# Patient Record
Sex: Male | Born: 1935 | Race: White | Hispanic: No | Marital: Married | Smoking: Former smoker
Health system: Southern US, Community
[De-identification: ages and names within clinical notes are randomized; demographics above are authoritative.]

## PROBLEM LIST (undated history)

## (undated) DIAGNOSIS — F0390 Unspecified dementia without behavioral disturbance: Secondary | ICD-10-CM

## (undated) DIAGNOSIS — N19 Unspecified kidney failure: Secondary | ICD-10-CM

## (undated) DIAGNOSIS — K219 Gastro-esophageal reflux disease without esophagitis: Secondary | ICD-10-CM

## (undated) DIAGNOSIS — K589 Irritable bowel syndrome without diarrhea: Secondary | ICD-10-CM

## (undated) DIAGNOSIS — I7300 Raynaud's syndrome without gangrene: Secondary | ICD-10-CM

## (undated) DIAGNOSIS — E785 Hyperlipidemia, unspecified: Secondary | ICD-10-CM

## (undated) DIAGNOSIS — I10 Essential (primary) hypertension: Secondary | ICD-10-CM

## (undated) DIAGNOSIS — K5790 Diverticulosis of intestine, part unspecified, without perforation or abscess without bleeding: Secondary | ICD-10-CM

## (undated) HISTORY — PX: HERNIA REPAIR: SHX51

## (undated) HISTORY — PX: CHOLECYSTECTOMY: SHX55

## (undated) HISTORY — PX: AV FISTULA PLACEMENT: SHX1204

---

## 2000-07-01 ENCOUNTER — Other Ambulatory Visit: Admission: RE | Admit: 2000-07-01 | Discharge: 2000-07-01 | Payer: Self-pay | Admitting: Gastroenterology

## 2000-07-01 ENCOUNTER — Encounter (INDEPENDENT_AMBULATORY_CARE_PROVIDER_SITE_OTHER): Payer: Self-pay

## 2001-06-02 ENCOUNTER — Encounter: Payer: Self-pay | Admitting: Internal Medicine

## 2001-06-02 ENCOUNTER — Ambulatory Visit (HOSPITAL_COMMUNITY): Admission: RE | Admit: 2001-06-02 | Discharge: 2001-06-02 | Payer: Self-pay | Admitting: Internal Medicine

## 2004-09-17 ENCOUNTER — Ambulatory Visit: Payer: Self-pay | Admitting: Internal Medicine

## 2004-09-20 ENCOUNTER — Ambulatory Visit: Payer: Self-pay | Admitting: Internal Medicine

## 2004-09-25 ENCOUNTER — Ambulatory Visit: Payer: Self-pay | Admitting: Internal Medicine

## 2004-10-03 ENCOUNTER — Ambulatory Visit: Payer: Self-pay | Admitting: Internal Medicine

## 2004-10-16 ENCOUNTER — Ambulatory Visit: Payer: Self-pay

## 2004-10-25 ENCOUNTER — Ambulatory Visit: Payer: Self-pay | Admitting: Internal Medicine

## 2004-11-07 ENCOUNTER — Ambulatory Visit (HOSPITAL_COMMUNITY): Admission: RE | Admit: 2004-11-07 | Discharge: 2004-11-07 | Payer: Self-pay | Admitting: Nephrology

## 2004-11-22 ENCOUNTER — Ambulatory Visit: Payer: Self-pay | Admitting: Internal Medicine

## 2004-12-12 ENCOUNTER — Encounter (HOSPITAL_COMMUNITY): Admission: RE | Admit: 2004-12-12 | Discharge: 2005-03-12 | Payer: Self-pay | Admitting: Nephrology

## 2005-01-01 ENCOUNTER — Ambulatory Visit: Payer: Self-pay | Admitting: Internal Medicine

## 2005-06-25 ENCOUNTER — Ambulatory Visit: Payer: Self-pay | Admitting: Internal Medicine

## 2005-08-13 ENCOUNTER — Ambulatory Visit: Payer: Self-pay | Admitting: Family Medicine

## 2005-11-03 ENCOUNTER — Ambulatory Visit: Payer: Self-pay | Admitting: Family Medicine

## 2005-12-08 ENCOUNTER — Ambulatory Visit: Payer: Self-pay | Admitting: Family Medicine

## 2005-12-22 ENCOUNTER — Ambulatory Visit: Payer: Self-pay | Admitting: Family Medicine

## 2005-12-25 ENCOUNTER — Encounter: Payer: Self-pay | Admitting: Family Medicine

## 2006-01-22 ENCOUNTER — Ambulatory Visit: Payer: Self-pay | Admitting: Family Medicine

## 2006-03-12 ENCOUNTER — Ambulatory Visit: Payer: Self-pay | Admitting: Family Medicine

## 2006-03-24 DIAGNOSIS — F339 Major depressive disorder, recurrent, unspecified: Secondary | ICD-10-CM | POA: Insufficient documentation

## 2006-03-24 DIAGNOSIS — N400 Enlarged prostate without lower urinary tract symptoms: Secondary | ICD-10-CM | POA: Insufficient documentation

## 2006-03-24 DIAGNOSIS — M19049 Primary osteoarthritis, unspecified hand: Secondary | ICD-10-CM

## 2006-03-24 DIAGNOSIS — I10 Essential (primary) hypertension: Secondary | ICD-10-CM

## 2006-03-24 DIAGNOSIS — I7300 Raynaud's syndrome without gangrene: Secondary | ICD-10-CM | POA: Insufficient documentation

## 2006-03-24 DIAGNOSIS — D126 Benign neoplasm of colon, unspecified: Secondary | ICD-10-CM

## 2006-03-24 DIAGNOSIS — D649 Anemia, unspecified: Secondary | ICD-10-CM

## 2006-03-24 DIAGNOSIS — E785 Hyperlipidemia, unspecified: Secondary | ICD-10-CM | POA: Insufficient documentation

## 2006-03-30 ENCOUNTER — Ambulatory Visit: Payer: Self-pay | Admitting: Family Medicine

## 2006-04-10 ENCOUNTER — Encounter: Payer: Self-pay | Admitting: Family Medicine

## 2006-04-27 ENCOUNTER — Ambulatory Visit: Payer: Self-pay | Admitting: Family Medicine

## 2006-05-19 ENCOUNTER — Encounter: Payer: Self-pay | Admitting: Family Medicine

## 2006-06-19 ENCOUNTER — Ambulatory Visit: Payer: Self-pay | Admitting: Family Medicine

## 2006-06-19 DIAGNOSIS — R0989 Other specified symptoms and signs involving the circulatory and respiratory systems: Secondary | ICD-10-CM

## 2006-06-19 DIAGNOSIS — R0609 Other forms of dyspnea: Secondary | ICD-10-CM | POA: Insufficient documentation

## 2006-08-10 ENCOUNTER — Encounter: Payer: Self-pay | Admitting: Family Medicine

## 2006-08-18 ENCOUNTER — Encounter: Payer: Self-pay | Admitting: Family Medicine

## 2006-09-18 ENCOUNTER — Ambulatory Visit: Payer: Self-pay | Admitting: Family Medicine

## 2006-09-21 ENCOUNTER — Encounter: Payer: Self-pay | Admitting: Family Medicine

## 2006-09-23 ENCOUNTER — Ambulatory Visit: Payer: Self-pay | Admitting: Family Medicine

## 2006-09-23 DIAGNOSIS — M19019 Primary osteoarthritis, unspecified shoulder: Secondary | ICD-10-CM | POA: Insufficient documentation

## 2006-10-16 ENCOUNTER — Ambulatory Visit: Payer: Self-pay | Admitting: Family Medicine

## 2006-11-03 ENCOUNTER — Ambulatory Visit: Payer: Self-pay | Admitting: Vascular Surgery

## 2006-11-04 ENCOUNTER — Ambulatory Visit: Payer: Self-pay | Admitting: Vascular Surgery

## 2006-11-04 ENCOUNTER — Ambulatory Visit (HOSPITAL_COMMUNITY): Admission: RE | Admit: 2006-11-04 | Discharge: 2006-11-04 | Payer: Self-pay | Admitting: Vascular Surgery

## 2006-11-24 ENCOUNTER — Telehealth: Payer: Self-pay | Admitting: Family Medicine

## 2006-12-04 ENCOUNTER — Ambulatory Visit: Payer: Self-pay | Admitting: Family Medicine

## 2006-12-29 ENCOUNTER — Ambulatory Visit: Payer: Self-pay | Admitting: Vascular Surgery

## 2007-03-25 ENCOUNTER — Ambulatory Visit: Payer: Self-pay | Admitting: Family Medicine

## 2007-04-22 ENCOUNTER — Ambulatory Visit: Payer: Self-pay | Admitting: Family Medicine

## 2007-05-24 ENCOUNTER — Telehealth: Payer: Self-pay | Admitting: Family Medicine

## 2007-06-18 ENCOUNTER — Telehealth: Payer: Self-pay | Admitting: Family Medicine

## 2007-06-24 ENCOUNTER — Encounter: Payer: Self-pay | Admitting: Family Medicine

## 2007-07-21 ENCOUNTER — Ambulatory Visit: Payer: Self-pay | Admitting: Family Medicine

## 2007-07-23 ENCOUNTER — Encounter: Payer: Self-pay | Admitting: Family Medicine

## 2007-10-20 ENCOUNTER — Encounter: Payer: Self-pay | Admitting: Family Medicine

## 2007-10-27 ENCOUNTER — Telehealth: Payer: Self-pay | Admitting: Family Medicine

## 2007-11-25 ENCOUNTER — Ambulatory Visit: Payer: Self-pay | Admitting: Family Medicine

## 2007-12-08 ENCOUNTER — Telehealth: Payer: Self-pay | Admitting: Family Medicine

## 2007-12-31 ENCOUNTER — Ambulatory Visit: Payer: Self-pay | Admitting: Family Medicine

## 2008-01-03 ENCOUNTER — Encounter: Payer: Self-pay | Admitting: Family Medicine

## 2008-01-20 ENCOUNTER — Telehealth: Payer: Self-pay | Admitting: Family Medicine

## 2008-03-03 ENCOUNTER — Telehealth: Payer: Self-pay | Admitting: Family Medicine

## 2008-03-06 ENCOUNTER — Encounter: Payer: Self-pay | Admitting: Family Medicine

## 2008-03-16 ENCOUNTER — Ambulatory Visit: Payer: Self-pay | Admitting: Family Medicine

## 2008-04-07 ENCOUNTER — Encounter: Payer: Self-pay | Admitting: Family Medicine

## 2008-04-07 DIAGNOSIS — N184 Chronic kidney disease, stage 4 (severe): Secondary | ICD-10-CM | POA: Insufficient documentation

## 2008-04-07 DIAGNOSIS — N2581 Secondary hyperparathyroidism of renal origin: Secondary | ICD-10-CM | POA: Insufficient documentation

## 2008-04-27 ENCOUNTER — Ambulatory Visit: Payer: Self-pay | Admitting: Family Medicine

## 2008-05-16 ENCOUNTER — Encounter: Payer: Self-pay | Admitting: Family Medicine

## 2008-07-17 ENCOUNTER — Telehealth: Payer: Self-pay | Admitting: Family Medicine

## 2008-10-02 ENCOUNTER — Ambulatory Visit: Payer: Self-pay | Admitting: Family Medicine

## 2008-10-02 DIAGNOSIS — M479 Spondylosis, unspecified: Secondary | ICD-10-CM | POA: Insufficient documentation

## 2008-10-02 DIAGNOSIS — R630 Anorexia: Secondary | ICD-10-CM

## 2008-10-03 ENCOUNTER — Encounter: Payer: Self-pay | Admitting: Family Medicine

## 2008-10-04 ENCOUNTER — Encounter: Payer: Self-pay | Admitting: Family Medicine

## 2008-10-23 ENCOUNTER — Ambulatory Visit: Payer: Self-pay | Admitting: Family Medicine

## 2008-10-23 DIAGNOSIS — T887XXA Unspecified adverse effect of drug or medicament, initial encounter: Secondary | ICD-10-CM

## 2008-11-08 ENCOUNTER — Encounter: Payer: Self-pay | Admitting: Family Medicine

## 2008-11-10 ENCOUNTER — Encounter: Admission: RE | Admit: 2008-11-10 | Discharge: 2008-11-10 | Payer: Self-pay | Admitting: Nephrology

## 2008-11-15 ENCOUNTER — Ambulatory Visit: Payer: Self-pay | Admitting: Family Medicine

## 2008-11-15 DIAGNOSIS — R1013 Epigastric pain: Secondary | ICD-10-CM

## 2008-11-15 LAB — CONVERTED CEMR LAB
Blood in Urine, dipstick: NEGATIVE
Ketones, urine, test strip: NEGATIVE
Nitrite: NEGATIVE
Urobilinogen, UA: 0.2

## 2008-11-16 LAB — CONVERTED CEMR LAB: Lipase: 68 units/L (ref 0–75)

## 2008-12-22 ENCOUNTER — Ambulatory Visit: Payer: Self-pay | Admitting: Family Medicine

## 2008-12-22 DIAGNOSIS — R0602 Shortness of breath: Secondary | ICD-10-CM

## 2008-12-22 DIAGNOSIS — R059 Cough, unspecified: Secondary | ICD-10-CM | POA: Insufficient documentation

## 2008-12-22 DIAGNOSIS — R05 Cough: Secondary | ICD-10-CM

## 2008-12-25 ENCOUNTER — Telehealth: Payer: Self-pay | Admitting: Family Medicine

## 2009-01-01 ENCOUNTER — Ambulatory Visit: Payer: Self-pay

## 2009-01-01 ENCOUNTER — Encounter: Payer: Self-pay | Admitting: Family Medicine

## 2009-01-02 ENCOUNTER — Telehealth: Payer: Self-pay | Admitting: Family Medicine

## 2009-01-22 ENCOUNTER — Telehealth: Payer: Self-pay | Admitting: Family Medicine

## 2009-03-12 ENCOUNTER — Encounter: Payer: Self-pay | Admitting: Family Medicine

## 2009-04-06 ENCOUNTER — Ambulatory Visit: Payer: Self-pay | Admitting: Family Medicine

## 2009-04-10 ENCOUNTER — Encounter: Payer: Self-pay | Admitting: Family Medicine

## 2009-07-20 ENCOUNTER — Encounter: Payer: Self-pay | Admitting: Family Medicine

## 2009-08-13 ENCOUNTER — Ambulatory Visit: Payer: Self-pay | Admitting: Family Medicine

## 2009-08-13 DIAGNOSIS — J019 Acute sinusitis, unspecified: Secondary | ICD-10-CM | POA: Insufficient documentation

## 2009-08-13 DIAGNOSIS — I959 Hypotension, unspecified: Secondary | ICD-10-CM | POA: Insufficient documentation

## 2009-08-16 ENCOUNTER — Encounter: Payer: Self-pay | Admitting: Family Medicine

## 2009-10-26 ENCOUNTER — Ambulatory Visit: Payer: Self-pay | Admitting: Family Medicine

## 2009-10-26 DIAGNOSIS — D485 Neoplasm of uncertain behavior of skin: Secondary | ICD-10-CM

## 2009-10-31 ENCOUNTER — Encounter: Payer: Self-pay | Admitting: Family Medicine

## 2010-02-13 ENCOUNTER — Encounter: Payer: Self-pay | Admitting: Family Medicine

## 2010-02-19 ENCOUNTER — Encounter: Payer: Self-pay | Admitting: Family Medicine

## 2010-03-08 ENCOUNTER — Ambulatory Visit: Payer: Self-pay | Admitting: Family Medicine

## 2010-03-08 DIAGNOSIS — H6120 Impacted cerumen, unspecified ear: Secondary | ICD-10-CM

## 2010-05-02 ENCOUNTER — Encounter: Payer: Self-pay | Admitting: Family Medicine

## 2010-05-02 ENCOUNTER — Ambulatory Visit: Payer: Self-pay | Admitting: Family Medicine

## 2010-05-02 DIAGNOSIS — H9209 Otalgia, unspecified ear: Secondary | ICD-10-CM | POA: Insufficient documentation

## 2010-05-03 LAB — CONVERTED CEMR LAB
CO2: 26 meq/L (ref 19–32)
Creatinine, Ser: 2.22 mg/dL — ABNORMAL HIGH (ref 0.40–1.50)
Eosinophils Relative: 1 % (ref 0–5)
Glucose, Bld: 118 mg/dL — ABNORMAL HIGH (ref 70–99)
HCT: 37.7 % — ABNORMAL LOW (ref 39.0–52.0)
Hemoglobin: 12.1 g/dL — ABNORMAL LOW (ref 13.0–17.0)
Lipase: 32 units/L (ref 0–75)
Lymphocytes Relative: 19 % (ref 12–46)
Lymphs Abs: 1.3 10*3/uL (ref 0.7–4.0)
Monocytes Absolute: 0.5 10*3/uL (ref 0.1–1.0)
Sodium: 139 meq/L (ref 135–145)
Total Bilirubin: 0.7 mg/dL (ref 0.3–1.2)
Total Protein: 6.4 g/dL (ref 6.0–8.3)
WBC: 6.5 10*3/uL (ref 4.0–10.5)

## 2010-05-24 ENCOUNTER — Encounter: Payer: Self-pay | Admitting: Family Medicine

## 2010-05-29 ENCOUNTER — Encounter
Admission: RE | Admit: 2010-05-29 | Discharge: 2010-05-29 | Payer: Self-pay | Source: Home / Self Care | Attending: Family Medicine | Admitting: Family Medicine

## 2010-05-29 ENCOUNTER — Ambulatory Visit: Payer: Self-pay | Admitting: Family Medicine

## 2010-05-29 DIAGNOSIS — N250 Renal osteodystrophy: Secondary | ICD-10-CM

## 2010-05-31 ENCOUNTER — Encounter: Payer: Self-pay | Admitting: Family Medicine

## 2010-05-31 DIAGNOSIS — M810 Age-related osteoporosis without current pathological fracture: Secondary | ICD-10-CM

## 2010-06-03 LAB — CONVERTED CEMR LAB
Cholesterol: 146 mg/dL (ref 0–200)
HDL: 57 mg/dL (ref >39)
LDL Cholesterol: 73 mg/dL (ref 0–99)
Triglycerides: 79 mg/dL (ref <150)

## 2010-07-11 ENCOUNTER — Ambulatory Visit
Admission: RE | Admit: 2010-07-11 | Discharge: 2010-07-11 | Payer: Self-pay | Source: Home / Self Care | Attending: Family Medicine | Admitting: Family Medicine

## 2010-07-11 ENCOUNTER — Encounter
Admission: RE | Admit: 2010-07-11 | Discharge: 2010-07-11 | Payer: Self-pay | Source: Home / Self Care | Attending: Family Medicine | Admitting: Family Medicine

## 2010-07-14 LAB — CONVERTED CEMR LAB
BUN: 36 mg/dL
Chloride: 102 meq/L
Cholesterol: 162 mg/dL (ref 0–200)
Potassium: 4.4 meq/L
Total CHOL/HDL Ratio: 3.1
Triglycerides: 139 mg/dL (ref <150)
VLDL: 28 mg/dL (ref 0–40)

## 2010-07-16 NOTE — Letter (Signed)
Summary: [facility]  [facility]   Imported By: Lanelle Bal 08/29/2009 11:31:31  _____________________________________________________________________  External Attachment:    Type:   Image     Comment:   External Document

## 2010-07-16 NOTE — Assessment & Plan Note (Signed)
Summary: sinusitis   Vital Signs:  Patient profile:   77 year old male Height:      68.75 inches Weight:      135 pounds BMI:     20.15 O2 Sat:      100 % on Room air Temp:     98.5 degrees F oral Pulse rate:   63 / minute BP sitting:   94 / 54  (left arm) Cuff size:   regular  Vitals Entered By: Payton Spark CMA (August 13, 2009 10:21 AM)  O2 Flow:  Room air CC: ? Bronchitis x 2 weeks.    Primary Care Provider:  Seymour Bars D.O.  CC:  ? Bronchitis x 2 weeks. Marland Kitchen  History of Present Illness: 77 yo WM presents for head congestion and sinus pressure x 2 wks.  He tried using saline sinus rinse which helped some.  He started coughing about 3 days ago.  No fevers.  His energy level is fair.  His appetitie has been fair.  He is not taking any cold meds with stage IV CKD.    He is taking Lasix 20 mg/ day. He is on Norvasc 5 mg/ day.  He is not having lightheadedness or dizziness.     Current Medications (verified): 1)  Atrovent  Soln (Ipratropium Bromide Soln) .... Nasal Spray 0.03% 2 Sprays Tid 2)  Calcitriol 0.25 Mcg Caps (Calcitriol) .Marland Kitchen.. 1 Capsule By Mouth Daily 3)  Fluticasone Propionate  Susp (Fluticasone Propionate Susp) .Marland Kitchen.. 1 Sp Per Nostril Daily 4)  Lasix 20 Mg Tabs (Furosemide) .Marland Kitchen.. 1 Tab By Mouth Daily 5)  Lovastatin 20 Mg Tabs (Lovastatin) .Marland Kitchen.. 1 Tab By Mouth Qhs 6)  Multivitamins  Tabs (Multiple Vitamin) .Marland Kitchen.. 1 Tab By Mouth Daily 7)  Norvasc 5 Mg Tabs (Amlodipine Besylate) .Marland Kitchen.. 1 Tab By Mouth Daily 8)  Omeprazole 40 Mg Cpdr (Omeprazole) .Marland Kitchen.. 1 Tab By Mouth Daily 9)  Mucinex 600 Mg  Tb12 (Guaifenesin) .... Take 1 Tablet By Mouth Two Times A Day 10)  Tylenol Extra Strength 500 Mg Tabs (Acetaminophen) .... 2 Tabs By Mouth Tid  Allergies (verified): No Known Drug Allergies  Past History:  Past Medical History: Reviewed history from 10/02/2008 and no changes required. stage 4 CKD Cr 3.0 -- sees Dr Eliott Nine Anemia, on Procrit since 2006  --2009 HTN knee OA  s/p Synvisc treatments hx of asbestos exposure  hx of BCC (Dr Emily Filbert)  hx of urethral stricture hx of NSAID use  Review of Systems      See HPI  Physical Exam  General:  alert, well-developed, well-nourished, and well-hydrated.  thin WM.  here w/ his wife. Head:  normocephalic and atraumatic.  sinuses NTTP Eyes:  conjunctiva clear, wears glasses Ears:  EACs full of soft brown cerumen (successfully irrigated today) Nose:  copious nasal congestion with boggy turbinates Mouth:  throat pink and moist with clear postnasal drip Neck:  no masses.   Lungs:  Normal respiratory effort, chest expands symmetrically. Lungs are clear to auscultation, no crackles or wheezes.  dry cough Heart:  Normal rate and regular rhythm. S1 and S2 normal without gallop, murmur, click, rub or other extra sounds. Extremities:  no LE edema R UE fistula with bruit and thrill present Skin:  color normal.   Cervical Nodes:  No lymphadenopathy noted   Impression & Recommendations:  Problem # 1:  ACUTE SINUSITIS, UNSPECIFIED (ICD-461.9) Treat with Amoxicillin x 10 days (decreased frequency due to Cr Cl). He can cont his nasal sprays  and use plain Mucinex.  Call if not improving. His updated medication list for this problem includes:    Atrovent Soln (Ipratropium bromide soln) ..... Nasal spray 0.03% 2 sprays tid    Fluticasone Propionate Susp (Fluticasone propionate susp) .Marland Kitchen... 1 sp per nostril daily    Mucinex 600 Mg Tb12 (Guaifenesin) .Marland Kitchen... Take 1 tablet by mouth two times a day    Amoxicillin 500 Mg Caps (Amoxicillin) .Marland Kitchen... 1 capusle by mouth q 12 hrs x 10 days  Problem # 2:  HYPOTENSION (ICD-458.9) Asymptomatic mildly low BP today likely due to concurrent acute illness. Hold Lasix x 72 hrs then restart 1/2 tab once daily during course of abx.   Call if any symptoms develop.  Complete Medication List: 1)  Atrovent Soln (Ipratropium bromide soln) .... Nasal spray 0.03% 2 sprays tid 2)  Calcitriol 0.25 Mcg  Caps (Calcitriol) .Marland Kitchen.. 1 capsule by mouth daily 3)  Fluticasone Propionate Susp (Fluticasone propionate susp) .Marland Kitchen.. 1 sp per nostril daily 4)  Lasix 20 Mg Tabs (Furosemide) .Marland Kitchen.. 1 tab by mouth daily 5)  Lovastatin 20 Mg Tabs (Lovastatin) .Marland Kitchen.. 1 tab by mouth qhs 6)  Multivitamins Tabs (Multiple vitamin) .Marland Kitchen.. 1 tab by mouth daily 7)  Norvasc 5 Mg Tabs (Amlodipine besylate) .Marland Kitchen.. 1 tab by mouth daily 8)  Omeprazole 40 Mg Cpdr (Omeprazole) .Marland Kitchen.. 1 tab by mouth daily 9)  Mucinex 600 Mg Tb12 (Guaifenesin) .... Take 1 tablet by mouth two times a day 10)  Tylenol Extra Strength 500 Mg Tabs (Acetaminophen) .... 2 tabs by mouth tid 11)  Amoxicillin 500 Mg Caps (Amoxicillin) .Marland Kitchen.. 1 capusle by mouth q 12 hrs x 10 days  Patient Instructions: 1)  For the next 3 days, hold your Lasix (furosemide) then cut in half until off Amoxicillin. 2)  Take Amoxicillin for sinusitis for the next 10 days. 3)  Take plain MUCINEX every 12 hrs for chest congestion. 4)  Use nasal saline for head congestion. 5)  If not completely resolved in 10 days, please call. Prescriptions: AMOXICILLIN 500 MG CAPS (AMOXICILLIN) 1 capusle by mouth q 12 hrs x 10 days  #20 x 0   Entered and Authorized by:   Seymour Bars DO   Signed by:   Seymour Bars DO on 08/13/2009   Method used:   Electronically to        [facility] [phone]* (retail)       [address] [place], [place]  [address]       Ph: [phone] or [phone]       Fax: [phone]   RxID:   [id number]

## 2010-07-16 NOTE — Assessment & Plan Note (Signed)
Summary: wax   Vital Signs:  Patient profile:   77 year old male Height:      68.75 inches Weight:      133 pounds BMI:     19.86 O2 Sat:      99 % on Room air Temp:     97.9 degrees F oral Pulse rate:   57 / minute BP sitting:   105 / 54  (left arm) Cuff size:   regular  Vitals Entered By: Payton Spark CMA (March 08, 2010 3:19 PM)  O2 Flow:  Room air  Primary Care Provider:  Seymour Bars D.O.   History of Present Illness: 77 yo WM presents for R ear pain with wax impaction and muffled hearing.  Tried Debrox but it didn't help much.  Has had wax impactions before.  He is due for flu shot.  O/W feels great. Has had f/u with Dr Eliott Nine for CKD and has had skin cancer removed from the top of his head.  Has f/u with Dr Emily Filbert.  Allergies: No Known Drug Allergies  Past History:  Past Medical History: Reviewed history from 10/02/2008 and no changes required. stage 4 CKD Cr 3.0 -- sees Dr Eliott Nine Anemia, on Procrit since 2006  --2009 HTN knee OA s/p Synvisc treatments hx of asbestos exposure  hx of BCC (Dr Emily Filbert)  hx of urethral stricture hx of NSAID use  Past Surgical History: Reviewed history from 04/07/2008 and no changes required. Cholecystectomy  Hernia Repair  R knee arthroscopy renal angiogram R AV fistula 11-04-07  Social History: Reviewed history from 03/24/2006 and no changes required. Retired.  Married to [place], has 8 kids, daughter Corrie Dandy in [place].  Active in church.  Walks 3 x a wk.  Quit smoking in 1960s.  Review of Systems      See HPI  Physical Exam  General:  alert, well-developed, well-nourished, and well-hydrated.   Head:  healed incision over the top of his head Ears:  bilat cerumen impaction, successfully irrigated.   Mouth:  pharynx pink and moist.   Lungs:  Normal respiratory effort, chest expands symmetrically. Lungs are clear to auscultation, no crackles or wheezes.  dry cough Heart:  Normal rate and regular rhythm. S1 and S2  normal without gallop, murmur, click, rub or other extra sounds. Extremities:  no LE edema Skin:  AKs on top of the head Psych:  Oriented X3.     Impression & Recommendations:  Problem # 1:  CERUMEN IMPACTION, BILATERAL (ICD-380.4) Successfully irrigated with reduction in pain and improvement in hearing right away.  Problem # 2:  NEOPLASM, SKIN, UNCERTAIN BEHAVIOR (ICD-238.2) Has derm f/u with Dr Emily Filbert. Skin cancer has been excavated from top of head at the skin surgery center.  Problem # 3:  HYPERTENSION, BENIGN SYSTEMIC (ICD-401.1)  His updated medication list for this problem includes:    Lasix 20 Mg Tabs (Furosemide) .Marland Kitchen... 1 tab by mouth daily    Norvasc 5 Mg Tabs (Amlodipine besylate) .Marland Kitchen... 1 tab by mouth daily  BP today: 105/54 Prior BP: 116/60 (10/26/2009)  Labs Reviewed: K+: 4.4 (08/10/2006) Creat: : 2.6 (08/10/2006)   Chol: 162 (12/31/2007)   HDL: 53 (12/31/2007)   LDL: 81 (12/31/2007)   TG: 139 (12/31/2007)  Complete Medication List: 1)  Calcitriol 0.25 Mcg Caps (Calcitriol) .Marland Kitchen.. 1 capsule by mouth daily 2)  Fluticasone Propionate Susp (Fluticasone propionate susp) .Marland Kitchen.. 1 sp per nostril daily 3)  Lasix 20 Mg Tabs (Furosemide) .Marland Kitchen.. 1 tab by mouth daily 4)  Lovastatin 20 Mg Tabs (Lovastatin) .Marland Kitchen.. 1 tab by mouth qhs 5)  Multivitamins Tabs (Multiple vitamin) .Marland Kitchen.. 1 tab by mouth daily 6)  Norvasc 5 Mg Tabs (Amlodipine besylate) .Marland Kitchen.. 1 tab by mouth daily 7)  Omeprazole 40 Mg Cpdr (Omeprazole) .Marland Kitchen.. 1 tab by mouth daily 8)  Mucinex 600 Mg Tb12 (Guaifenesin) .... Take 1 tablet by mouth two times a day 9)  Tylenol Extra Strength 500 Mg Tabs (Acetaminophen) .... 2 tabs by mouth tid  Patient Instructions: 1)  Eardrums look great! 2)  Use Debrox once a month for maintenance. 3)  Return for a PHYSICAL in 3 months. 4)  Flu shot today.  Appended Document: wax     Allergies: No Known Drug Allergies   Complete Medication List: 1)  Calcitriol 0.25 Mcg Caps (Calcitriol)  .Marland Kitchen.. 1 capsule by mouth daily 2)  Fluticasone Propionate Susp (Fluticasone propionate susp) .Marland Kitchen.. 1 sp per nostril daily 3)  Lasix 20 Mg Tabs (Furosemide) .Marland Kitchen.. 1 tab by mouth daily 4)  Lovastatin 20 Mg Tabs (Lovastatin) .Marland Kitchen.. 1 tab by mouth qhs 5)  Multivitamins Tabs (Multiple vitamin) .Marland Kitchen.. 1 tab by mouth daily 6)  Norvasc 5 Mg Tabs (Amlodipine besylate) .Marland Kitchen.. 1 tab by mouth daily 7)  Omeprazole 40 Mg Cpdr (Omeprazole) .Marland Kitchen.. 1 tab by mouth daily 8)  Mucinex 600 Mg Tb12 (Guaifenesin) .... Take 1 tablet by mouth two times a day 9)  Tylenol Extra Strength 500 Mg Tabs (Acetaminophen) .... 2 tabs by mouth tid  Other Orders: Admin 1st Vaccine (16109) Flu Vaccine 92yrs + (60454) Flu Vaccine Consent Questions     Do you have a history of severe allergic reactions to this vaccine? no    Any prior history of allergic reactions to egg and/or gelatin? no    Do you have a sensitivity to the preservative Thimersol? no    Do you have a past history of Guillan-Barre Syndrome? no    Do you currently have an acute febrile illness? no    Have you ever had a severe reaction to latex? no    Vaccine information given and explained to patient? yes    Are you currently pregnant? no    Lot Number:[id number]   Exp Date:12/14/2010   Site Given  Left Deltoid IMccine 47yrs + (09811)    .lbflu

## 2010-07-16 NOTE — Assessment & Plan Note (Signed)
Summary: CKD f/u   Vital Signs:  Patient profile:   77 year old male Height:      68.75 inches Weight:      130 pounds BMI:     19.41 O2 Sat:      95 % on Room air Temp:     97.8 degrees F oral Pulse rate:   60 / minute BP sitting:   95 / 57  (left arm) Cuff size:   regular  Vitals Entered By: Payton Spark CMA (May 02, 2010 9:37 AM)  O2 Flow:  Room air CC: R ear pain x 2 days.   Primary Care Provider:  Seymour Bars D.O.  CC:  R ear pain x 2 days.Stanley Meyers  History of Present Illness: 77 yo WM presents for R ear pain x 1 day and he has c/o some GI upset and a low potassium diet because Dr Eliott Nine reported that his K+ rose.  He goes back to her on 12-9.    Denies N/V but has some rumbling in his abdomen. He has some R ear pain but no ringing, dizziness or hearing loss.      Allergies: No Known Drug Allergies  Past History:  Past Medical History: Reviewed history from 10/02/2008 and no changes required. stage 4 CKD Cr 3.0 -- sees Dr Eliott Nine Anemia, on Procrit since 2006  --2009 HTN knee OA s/p Synvisc treatments hx of asbestos exposure  hx of BCC (Dr Emily Filbert)  hx of urethral stricture hx of NSAID use  Past Surgical History: Reviewed history from 04/07/2008 and no changes required. Cholecystectomy  Hernia Repair  R knee arthroscopy renal angiogram R AV fistula 11-04-07  Social History: Reviewed history from 03/24/2006 and no changes required. Retired.  Married to [place], has 8 kids, daughter Corrie Dandy in [place].  Active in church.  Walks 3 x a wk.  Quit smoking in 1960s.  Review of Systems      See HPI  Physical Exam  General:  alert, well-developed, well-nourished, and well-hydrated.  here with wife Eyes:  sclera non icteric Ears:  EACs patent; TMs translucent and gray with good cone of light and bony landmarks.  Nose:  no nasal discharge.   Mouth:  pharynx pink and moist.   Neck:  no masses.   Lungs:  Normal respiratory effort, chest expands symmetrically.  Lungs are clear to auscultation, no crackles or wheezes. Heart:  Normal rate and regular rhythm. S1 and S2 normal without gallop, murmur, click, rub or other extra sounds. Abdomen:  scaphoid abdomen with hyperactive BS, ND, no HSM.  no R/G/R Extremities:  no LE edema Skin:  color normal.   Cervical Nodes:  No lymphadenopathy noted Psych:  good eye contact, not anxious appearing, and not depressed appearing.     Impression & Recommendations:  Problem # 1:  EAR PAIN, RIGHT (ICD-388.70) Normal ear exam.  No cerumen impaction this time.  Mild.  Likely to be eustachian tube dysfunction.  Alredy on nasal spray.  Problem # 2:  CHRONIC KIDNEY DISEASE STAGE IV (SEVERE) (ICD-585.4) His anorexia, abd pain and weakness are likely to be from worsening renal function esp given his 'rise in K+' which he recently heard about.  I am going to get labs on him today and will send copy to Dr Eliott Nine for his 12-9 appt.  he appears well hydrated but is losing wt.   Orders: T-Comprehensive Metabolic Panel [id number]) T-CBC w/Diff ([id number])  Complete Medication List: 1)  Calcitriol 0.25 Mcg Caps (Calcitriol) .Stanley KitchenMarland KitchenMarland Meyers  1 capsule by mouth daily 2)  Fluticasone Propionate Susp (Fluticasone propionate susp) .Stanley Meyers.. 1 sp per nostril daily 3)  Lasix 20 Mg Tabs (Furosemide) .Stanley Meyers.. 1 tab by mouth daily 4)  Lovastatin 20 Mg Tabs (Lovastatin) .Stanley Meyers.. 1 tab by mouth qhs 5)  Multivitamins Tabs (Multiple vitamin) .Stanley Meyers.. 1 tab by mouth daily 6)  Norvasc 5 Mg Tabs (Amlodipine besylate) .Stanley Meyers.. 1 tab by mouth daily 7)  Omeprazole 40 Mg Cpdr (Omeprazole) .Stanley Meyers.. 1 tab by mouth daily 8)  Mucinex 600 Mg Tb12 (Guaifenesin) .... Take 1 tablet by mouth two times a day 9)  Tylenol Extra Strength 500 Mg Tabs (Acetaminophen) .... 2 tabs by mouth tid  Other Orders: T-Lipase ([id number])  Patient Instructions: 1)  Labs today. 2)  Will call you w/ results tomorrow. 3)  F/U with Dr Eliott Nine for CKD.   Orders Added: 1)  T-Comprehensive Metabolic  Panel [[id number]] 2)  T-Lipase [[id number]] 3)  T-CBC w/Diff [[id number]] 4)  Est. Patient Level III [69629]

## 2010-07-16 NOTE — Assessment & Plan Note (Signed)
Summary: skin lesion   Vital Signs:  Patient profile:   77 year old male Height:      68.75 inches Weight:      135 pounds BMI:     20.15 O2 Sat:      99 % on Room air Temp:     97.8 degrees F oral Pulse rate:   64 / minute BP sitting:   116 / 60  (left arm) Cuff size:   regular  Vitals Entered By: Payton Spark CMA (Oct 26, 2009 11:16 AM)  O2 Flow:  Room air CC: ?Nicked top of head while shaving and lesion is still not healed x 2 weeks. Wife thinks lesion is getting bigger.   Primary Care Provider:  Seymour Bars D.O.  CC:  ?Nicked top of head while shaving and lesion is still not healed x 2 weeks. Wife thinks lesion is getting bigger.Marland Kitchen  History of Present Illness: 77 yo WM presents for a nontender non healing sore on the top of his head that bleeds from time to time.  He is not sure how he got it.  He sees Dr Emily Filbert but she has not seen this.  He has had pre- cancers and BCC. This lesion have been there for several weeks.  This lesion came right after he saw Dr Emily Filbert in April.    Current Medications (verified): 1)  Atrovent  Soln (Ipratropium Bromide Soln) .... Nasal Spray 0.03% 2 Sprays Tid 2)  Calcitriol 0.25 Mcg Caps (Calcitriol) .Marland Kitchen.. 1 Capsule By Mouth Daily 3)  Fluticasone Propionate  Susp (Fluticasone Propionate Susp) .Marland Kitchen.. 1 Sp Per Nostril Daily 4)  Lasix 20 Mg Tabs (Furosemide) .Marland Kitchen.. 1 Tab By Mouth Daily 5)  Lovastatin 20 Mg Tabs (Lovastatin) .Marland Kitchen.. 1 Tab By Mouth Qhs 6)  Multivitamins  Tabs (Multiple Vitamin) .Marland Kitchen.. 1 Tab By Mouth Daily 7)  Norvasc 5 Mg Tabs (Amlodipine Besylate) .Marland Kitchen.. 1 Tab By Mouth Daily 8)  Omeprazole 40 Mg Cpdr (Omeprazole) .Marland Kitchen.. 1 Tab By Mouth Daily 9)  Mucinex 600 Mg  Tb12 (Guaifenesin) .... Take 1 Tablet By Mouth Two Times A Day 10)  Tylenol Extra Strength 500 Mg Tabs (Acetaminophen) .... 2 Tabs By Mouth Tid  Allergies (verified): No Known Drug Allergies  Past History:  Past Medical History: Reviewed history from 10/02/2008 and no changes  required. stage 4 CKD Cr 3.0 -- sees Dr Eliott Nine Anemia, on Procrit since 2006  --2009 HTN knee OA s/p Synvisc treatments hx of asbestos exposure  hx of BCC (Dr Emily Filbert)  hx of urethral stricture hx of NSAID use  Past Surgical History: Reviewed history from 04/07/2008 and no changes required. Cholecystectomy  Hernia Repair  R knee arthroscopy renal angiogram R AV fistula 11-04-07  Social History: Reviewed history from 03/24/2006 and no changes required. Retired.  Married to [place], has 8 kids, daughter Corrie Dandy in [place].  Active in church.  Walks 3 x a wk.  Quit smoking in 1960s.  Review of Systems      See HPI  Physical Exam  General:  alert, well-developed, well-nourished, and well-hydrated.   Skin:  1 cm pink scaley raised lesion right on the top of his had (he is bald) that has a scab on the top.  not currently bleeding or oozing.     Impression & Recommendations:  Problem # 1:  NEOPLASM, SKIN, UNCERTAIN BEHAVIOR (ICD-238.2) Poorly healing skin lesion on top of head which is suspicious for SCC or BCC.  Will call to get him in with  Dr Emily Filbert for an excisional biopsy next wk.    Complete Medication List: 1)  Calcitriol 0.25 Mcg Caps (Calcitriol) .Marland Kitchen.. 1 capsule by mouth daily 2)  Fluticasone Propionate Susp (Fluticasone propionate susp) .Marland Kitchen.. 1 sp per nostril daily 3)  Lasix 20 Mg Tabs (Furosemide) .Marland Kitchen.. 1 tab by mouth daily 4)  Lovastatin 20 Mg Tabs (Lovastatin) .Marland Kitchen.. 1 tab by mouth qhs 5)  Multivitamins Tabs (Multiple vitamin) .Marland Kitchen.. 1 tab by mouth daily 6)  Norvasc 5 Mg Tabs (Amlodipine besylate) .Marland Kitchen.. 1 tab by mouth daily 7)  Omeprazole 40 Mg Cpdr (Omeprazole) .Marland Kitchen.. 1 tab by mouth daily 8)  Mucinex 600 Mg Tb12 (Guaifenesin) .... Take 1 tablet by mouth two times a day 9)  Tylenol Extra Strength 500 Mg Tabs (Acetaminophen) .... 2 tabs by mouth tid

## 2010-07-16 NOTE — Letter (Signed)
Summary: [facility]  [facility]   Imported By: Lanelle Bal 03/11/2010 11:55:02  _____________________________________________________________________  External Attachment:    Type:   Image     Comment:   External Document

## 2010-07-16 NOTE — Letter (Signed)
Summary: [facility]  [facility]   Imported By: Lanelle Bal 11/15/2009 09:14:15  _____________________________________________________________________  External Attachment:    Type:   Image     Comment:   External Document

## 2010-07-18 ENCOUNTER — Encounter: Payer: Self-pay | Admitting: Family Medicine

## 2010-07-18 NOTE — Letter (Signed)
Summary: [facility]  [facility]   Imported By: Lanelle Bal 06/14/2010 12:37:59  _____________________________________________________________________  External Attachment:    Type:   Image     Comment:   External Document

## 2010-07-18 NOTE — Assessment & Plan Note (Signed)
Summary: Medicare Wellness exam   Vital Signs:  Patient profile:   77 year old male Height:      68.75 inches Weight:      135 pounds Temp:     97.8 degrees F oral Pulse rate:   64 / minute BP sitting:   109 / 58  (right arm) Cuff size:   regular  Vitals Entered By: Avon Gully CMA, Duncan Dull) (May 29, 2010 2:46 PM) CC: CPE   History of Present Illness: I have personally reviewed the Medicare Annual Wellness questionnaire and have noted 1.   The patient's medical and social history 2.   Their use of alcohol, tobacco or illicit drugs 3.   Their current medications and supplements 4.   The patient's functional ability including ADL's, fall risks, home safety risks and hearing or visual             impairment. 5.   Diet and physical activities 6.   Evidence for depression or mood disorders  The patients weight, height, BMI and visual acuity have been recorded in the chart I have made referrals, counseling and provided education to the patient based review of the above and I have provided the pt with a written personalized care plan for preventive services.    Habits & Providers  Alcohol-Tobacco-Diet     Tobacco Status: never     Tobacco Counseling: not indicated; no tobacco use  Problems Prior to Update: 1)  Senile Osteoporosis  (ICD-733.01) 2)  Ear Pain, Right  (ICD-388.70) 3)  Cerumen Impaction, Bilateral  (ICD-380.4) 4)  Neoplasm, Skin, Uncertain Behavior  (ICD-238.2) 5)  Hypotension  (ICD-458.9) 6)  Acute Sinusitis, Unspecified  (ICD-461.9) 7)  Cough  (ICD-786.2) 8)  Shortness of Breath  (ICD-786.05) 9)  Abdominal Pain, Epigastric  (ICD-789.06) 10)  Adverse Drug Reaction  (ICD-995.20) 11)  Loss of Appetite  (ICD-783.0) 12)  Arthritis, Cervical Spine  (ICD-721.90) 13)  Secondary Hyperparathyroidism  (ICD-588.81) 14)  Chronic Kidney Disease Stage Iv (SEVERE)  (ICD-585.4) 15)  Arthropathy Nos, Shoulder  (ICD-716.91) 16)  Dyspnea On Exertion   (ICD-786.09) 17)  Raynauds Syndrome  (ICD-443.0) 18)  Prostatic Hypertrophy, Benign  (ICD-600) 19)  Osteoarthritis, Multi Sites  (ICD-715.98) 20)  Hypertension, Benign Systemic  (ICD-401.1) 21)  Hyperlipidemia  (ICD-272.4) 22)  Depression, Major, Recurrent  (ICD-296.30) 23)  Colon Polyp  (ICD-211.3) 24)  Anemia, Other, Unspecified  (ICD-285.9)  Medications Prior to Update: 1)  Calcitriol 0.25 Mcg Caps (Calcitriol) .Marland Kitchen.. 1 Capsule By Mouth Daily 2)  Fluticasone Propionate  Susp (Fluticasone Propionate Susp) .Marland Kitchen.. 1 Sp Per Nostril Daily 3)  Lasix 20 Mg Tabs (Furosemide) .Marland Kitchen.. 1 Tab By Mouth Daily 4)  Lovastatin 20 Mg Tabs (Lovastatin) .Marland Kitchen.. 1 Tab By Mouth Qhs 5)  Multivitamins  Tabs (Multiple Vitamin) .Marland Kitchen.. 1 Tab By Mouth Daily 6)  Norvasc 5 Mg Tabs (Amlodipine Besylate) .Marland Kitchen.. 1 Tab By Mouth Daily 7)  Omeprazole 40 Mg Cpdr (Omeprazole) .Marland Kitchen.. 1 Tab By Mouth Daily 8)  Mucinex 600 Mg  Tb12 (Guaifenesin) .... Take 1 Tablet By Mouth Two Times A Day 9)  Tylenol Extra Strength 500 Mg Tabs (Acetaminophen) .... 2 Tabs By Mouth Tid  Current Medications (verified): 1)  Calcitriol 0.25 Mcg Caps (Calcitriol) .Marland Kitchen.. 1 Capsule By Mouth Daily 2)  Fluticasone Propionate  Susp (Fluticasone Propionate Susp) .Marland Kitchen.. 1 Sp Per Nostril Daily 3)  Lasix 20 Mg Tabs (Furosemide) .Marland Kitchen.. 1 Tab By Mouth Daily 4)  Lovastatin 20 Mg Tabs (Lovastatin) .Marland Kitchen.. 1 Tab By  Mouth Qhs 5)  Multivitamins  Tabs (Multiple Vitamin) .Marland Kitchen.. 1 Tab By Mouth Daily 6)  Norvasc 5 Mg Tabs (Amlodipine Besylate) .Marland Kitchen.. 1 Tab By Mouth Daily 7)  Omeprazole 40 Mg Cpdr (Omeprazole) .Marland Kitchen.. 1 Tab By Mouth Daily 8)  Mucinex 600 Mg  Tb12 (Guaifenesin) .... Take 1 Tablet By Mouth Two Times A Day 9)  Tylenol Extra Strength 500 Mg Tabs (Acetaminophen) .... 2 Tabs By Mouth Tid  Allergies (verified): No Known Drug Allergies  Directives (verified): 1)  Pt Defered   Comments:  Nurse/Medical Assistant: The patient's medications and allergies were reviewed with the  patient and were updated in the Medication and Allergy Lists. Avon Gully CMA, Duncan Dull) (May 29, 2010 2:47 PM)  Past History:  Past Medical History: Last updated: 10/02/2008 stage 4 CKD Cr 3.0 -- sees Dr Eliott Nine Anemia, on Procrit since 2006  --2009 HTN knee OA s/p Synvisc treatments hx of asbestos exposure  hx of BCC (Dr Emily Filbert)  hx of urethral stricture hx of NSAID use  Past Surgical History: Last updated: 04/07/2008 Cholecystectomy  Hernia Repair  R knee arthroscopy renal angiogram R AV fistula 11-04-07  Family History: Last updated: 04/10/2006 8 sibblings alive father unknown  mother died at 105 of DM, HTN  sister CKD  Social History: Last updated: 03/24/2006 Retired.  Married to [place], has 8 kids, daughter Corrie Dandy in [place].  Active in church.  Walks 3 x a wk.  Quit smoking in 1960s.  Risk Factors: Smoking Status: never (05/29/2010)  Social History: Smoking Status:  never  Physical Exam  General:  alert, well-developed, well-nourished, and well-hydrated.  thin. Head:  normocephalic, atraumatic, and male-pattern balding.   Eyes:  pupils equal, pupils round, and pupils reactive to light.  wears glasses Mouth:  pharynx pink and moist.   Neck:  no masses.   Lungs:  Normal respiratory effort, chest expands symmetrically. Lungs are clear to auscultation, no crackles or wheezes. Heart:  Normal rate and regular rhythm. S1 and S2 normal without gallop, murmur, click, rub or other extra sounds. Abdomen:  soft.  tender over the LUQ with guarding, ND.  No HSM.  no AA bruits Prostate:  deferred (due to age) Pulses:  2+ radial and pedal pulses (loud bruit from fistula in the RUE) Extremities:  no UE or LE edema acrocyanosis of the fingers both hands (Raynauds) Skin:  color normal.   Psych:  good eye contact, not anxious appearing, and not depressed appearing.     Impression & Recommendations:  Problem # 1:  HEALTH MAINTENANCE EXAM (ICD-V70.0)  See Scanned in  [employer] Wellness form.  Orders: Medicare -1st Annual Wellness Visit [phone])  Complete Medication List: 1)  Calcitriol 0.25 Mcg Caps (Calcitriol) .Marland Kitchen.. 1 capsule by mouth daily 2)  Fluticasone Propionate Susp (Fluticasone propionate susp) .Marland Kitchen.. 1 sp per nostril daily 3)  Lasix 20 Mg Tabs (Furosemide) .Marland Kitchen.. 1 tab by mouth daily 4)  Lovastatin 20 Mg Tabs (Lovastatin) .Marland Kitchen.. 1 tab by mouth qhs 5)  Multivitamins Tabs (Multiple vitamin) .Marland Kitchen.. 1 tab by mouth daily 6)  Norvasc 5 Mg Tabs (Amlodipine besylate) .Marland Kitchen.. 1 tab by mouth daily 7)  Omeprazole 40 Mg Cpdr (Omeprazole) .Marland Kitchen.. 1 tab by mouth daily 8)  Mucinex 600 Mg Tb12 (Guaifenesin) .... Take 1 tablet by mouth two times a day 9)  Tylenol Extra Strength 500 Mg Tabs (Acetaminophen) .... 2 tabs by mouth tid  Other Orders: T-DG ABD 1 View (74000) T-Lipid Profile (09811-91478) T-Hemoglobin A1C (29562) T-DXA Bone Density/ Appendicular (  16109) T-Dual DXA Bone Density/ Axial (60454)  Patient Instructions: 1)  Will get BONE DENSITY ordered downstairs. 2)  Obtain Abdominal XRAY today. 3)  Will call you w/ results tomorrow. 4)  Update fasting labs. 5)  Will call you w/ results. 6)  Return for follow up in 4 mos. 7)  I have provided you with a copy of your personalized plan for preventive services. Please take the time to review along with your updated medication list.   Orders Added: 1)  T-DG ABD 1 View [74000] 2)  T-Lipid Profile [80061-22930] 3)  T-Hemoglobin A1C [23375] 4)  T-DXA Bone Density/ Appendicular [77081] 5)  T-Dual DXA Bone Density/ Axial [77080] 6)  Medicare -1st Annual Wellness Visit [G0438]

## 2010-07-18 NOTE — Assessment & Plan Note (Signed)
Summary: cough   Vital Signs:  Patient profile:   77 year old male Height:      68.75 inches Weight:      136 pounds BMI:     20.30 O2 Sat:      98 % on Room air Pulse rate:   80 / minute BP sitting:   115 / 66  (left arm) Cuff size:   regular  Vitals Entered By: Payton Spark CMA (July 11, 2010 11:20 AM)  O2 Flow:  Room air CC: Drainage, congestion, and cough x 3 weeks.   Primary Care Provider:  Seymour Bars D.O.  CC:  Drainage, congestion, and and cough x 3 weeks.Marland Kitchen  History of Present Illness: 77 yo WM presents for cold symptoms that started 3 wks ago with sore throat and runny nose.  He is having a cough that continues to bother him.  He has not run a fever.  HHe has been taking Mucinex 2 x a day which is helping some.  He is producing a yellow sputum.  Denies SOB or chest tightness.  He is not coughing at night.  Denies GI upset or bodyaches or malaise.    Current Medications (verified): 1)  Calcitriol 0.25 Mcg Caps (Calcitriol) .Marland Kitchen.. 1 Capsule By Mouth Daily 2)  Lasix 20 Mg Tabs (Furosemide) .Marland Kitchen.. 1 Tab By Mouth Daily 3)  Lovastatin 20 Mg Tabs (Lovastatin) .Marland Kitchen.. 1 Tab By Mouth Qhs 4)  Multivitamins  Tabs (Multiple Vitamin) .Marland Kitchen.. 1 Tab By Mouth Daily 5)  Norvasc 5 Mg Tabs (Amlodipine Besylate) .Marland Kitchen.. 1 Tab By Mouth Daily 6)  Omeprazole 40 Mg Cpdr (Omeprazole) .Marland Kitchen.. 1 Tab By Mouth Daily 7)  Mucinex 600 Mg  Tb12 (Guaifenesin) .... Take 1 Tablet By Mouth Two Times A Day 8)  Tylenol Extra Strength 500 Mg Tabs (Acetaminophen) .... 2 Tabs By Mouth Tid 9)  Nasaflo Neti Pot Nasal Wash  Pack (Hypertonic Nasal Wash)  Allergies (verified): No Known Drug Allergies  Past History:  Past Medical History: Reviewed history from 10/02/2008 and no changes required. stage 4 CKD Cr 3.0 -- sees Dr Eliott Nine Anemia, on Procrit since 2006  --2009 HTN knee OA s/p Synvisc treatments hx of asbestos exposure  hx of BCC (Dr Emily Filbert)  hx of urethral stricture hx of NSAID use  Past Surgical  History: Reviewed history from 04/07/2008 and no changes required. Cholecystectomy  Hernia Repair  R knee arthroscopy renal angiogram R AV fistula 11-04-07  Social History: Reviewed history from 03/24/2006 and no changes required. Retired.  Married to [place], has 8 kids, daughter Corrie Dandy in [place].  Active in church.  Walks 3 x a wk.  Quit smoking in 1960s.  Review of Systems      See HPI  Physical Exam  General:  alert, well-developed, well-nourished, and well-hydrated.   Head:  normocephalic and atraumatic.  sinuses NTTP Eyes:  conjunctiva clear Ears:  EACs patent; TMs translucent and gray with good cone of light and bony landmarks.  Nose:  clear rhinorrhea Mouth:  o/p pink and moist, no injection Neck:  no masses.   Lungs:  normal respiratory effort, no intercostal retractions, no accessory muscle use, no dullness, and no crackles.  bibasilar rhonchi Heart:  Normal rate and regular rhythm. S1 and S2 normal without gallop, murmur, click, rub or other extra sounds. Extremities:  no LE edema Skin:  color normal.   Cervical Nodes:  No lymphadenopathy noted   Impression & Recommendations:  Problem # 1:  COUGH (ICD-786.2) Likely viral  cough from URI at onset but since he has some bibasilar rhonchi and chronic renal failure, will get a CXR to make sure there is not an infiltrate present. Orders: T-Chest x-ray, 2 views (71020)  Complete Medication List: 1)  Calcitriol 0.25 Mcg Caps (Calcitriol) .Marland Kitchen.. 1 capsule by mouth daily 2)  Lasix 20 Mg Tabs (Furosemide) .Marland Kitchen.. 1 tab by mouth daily 3)  Lovastatin 20 Mg Tabs (Lovastatin) .Marland Kitchen.. 1 tab by mouth qhs 4)  Multivitamins Tabs (Multiple vitamin) .Marland Kitchen.. 1 tab by mouth daily 5)  Norvasc 5 Mg Tabs (Amlodipine besylate) .Marland Kitchen.. 1 tab by mouth daily 6)  Omeprazole 40 Mg Cpdr (Omeprazole) .Marland Kitchen.. 1 tab by mouth daily 7)  Mucinex 600 Mg Tb12 (Guaifenesin) .... Take 1 tablet by mouth two times a day 8)  Tylenol Extra Strength 500 Mg Tabs (Acetaminophen)  .... 2 tabs by mouth tid 9)  Nasaflo Neti Pot Nasal Wash Pack (Hypertonic nasal wash)  Patient Instructions: 1)  OK to stay on Mucinex every 12 hrs. 2)  Add Afrin nasal spray 2 x a day for nasal congestion. 3)  CXR today. 4)  Will call you w/ results this afternoon.   Orders Added: 1)  T-Chest x-ray, 2 views [71020] 2)  Est. Patient Level III [16109]

## 2010-08-10 ENCOUNTER — Encounter: Payer: Self-pay | Admitting: Emergency Medicine

## 2010-08-10 ENCOUNTER — Ambulatory Visit (INDEPENDENT_AMBULATORY_CARE_PROVIDER_SITE_OTHER): Payer: BC Managed Care – PPO | Admitting: Emergency Medicine

## 2010-08-10 DIAGNOSIS — H6120 Impacted cerumen, unspecified ear: Secondary | ICD-10-CM

## 2010-08-13 NOTE — Assessment & Plan Note (Signed)
Summary: LT EAR PAIN/WSE (rm 5)   Vital Signs:  Patient Profile:   77 Years Old Male CC:      left ear pain x yesterday Height:     68.75 inches Weight:      133 pounds O2 Sat:      99 % O2 treatment:    Room Air Temp:     97.5 degrees F oral Pulse rate:   66 / minute Resp:     16 per minute BP sitting:   115 / 58  (left arm) Cuff size:   regular  Vitals Entered By: Lajean Saver RN (August 10, 2010 11:54 AM)                  Updated Prior Medication List: CALCITRIOL 0.25 MCG CAPS (CALCITRIOL) 1 capsule by mouth daily LASIX 20 MG TABS (FUROSEMIDE) 1 tab by mouth daily LOVASTATIN 20 MG TABS (LOVASTATIN) 1 tab by mouth qhs MULTIVITAMINS  TABS (MULTIPLE VITAMIN) 1 tab by mouth daily NORVASC 5 MG TABS (AMLODIPINE BESYLATE) 1 tab by mouth daily OMEPRAZOLE 40 MG CPDR (OMEPRAZOLE) 1 tab by mouth daily TYLENOL EXTRA STRENGTH 500 MG TABS (ACETAMINOPHEN) 2 tabs by mouth tid NASAFLO NETI POT NASAL WASH  PACK (HYPERTONIC NASAL WASH)   Current Allergies: No known allergies History of Present Illness History from: patient & spouse Chief Complaint: left ear pain x yesterday History of Present Illness: L ear pain since yesterday.  Frequently has wax buildup.  No other URI symptoms.  No F/C/N/V.  Not using any OTC meds.  REVIEW OF SYSTEMS Constitutional Symptoms      Denies fever, chills, night sweats, weight loss, weight gain, and fatigue.  Eyes       Denies change in vision, eye pain, eye discharge, glasses, contact lenses, and eye surgery. Ear/Nose/Throat/Mouth       Complains of ear pain and frequent runny nose.      Denies hearing loss/aids, change in hearing, ear discharge, dizziness, frequent nose bleeds, sinus problems, sore throat, hoarseness, and tooth pain or bleeding.      Comments: left ear Respiratory       Denies dry cough, productive cough, wheezing, shortness of breath, asthma, bronchitis, and emphysema/COPD.  Cardiovascular       Denies murmurs, chest pain, and  tires easily with exhertion.    Gastrointestinal       Denies stomach pain, nausea/vomiting, diarrhea, constipation, blood in bowel movements, and indigestion. Genitourniary       Denies painful urination, blood or discharge from penis, kidney stones, and loss of urinary control. Neurological       Denies paralysis, seizures, and fainting/blackouts. Musculoskeletal       Denies muscle pain, joint pain, joint stiffness, decreased range of motion, redness, swelling, muscle weakness, and gout.  Skin       Denies bruising, unusual mles/lumps or sores, and hair/skin or nail changes.  Psych       Denies mood changes, temper/anger issues, anxiety/stress, speech problems, depression, and sleep problems.  Past History:  Past Medical History: Reviewed history from 10/02/2008 and no changes required. stage 4 CKD Cr 3.0 -- sees Dr Eliott Nine Anemia, on Procrit since 2006  --2009 HTN knee OA s/p Synvisc treatments hx of asbestos exposure  hx of BCC (Dr Emily Filbert)  hx of urethral stricture hx of NSAID use  Past Surgical History: Reviewed history from 04/07/2008 and no changes required. Cholecystectomy  Hernia Repair  R knee arthroscopy renal angiogram R AV fistula 11-04-07  Family History: Reviewed history from 04/10/2006 and no changes required. 8 sibblings alive father unknown  mother died at 60 of DM, HTN  sister CKD  Social History: Reviewed history from 03/24/2006 and no changes required. Retired.  Married to [place], has 8 kids, daughter Corrie Dandy in [place].  Active in church.  Walks 3 x a wk.  Quit smoking in 1960s. Physical Exam General appearance: well developed, well nourished, no acute distress Nasal: mucosa pink, nonedematous, no septal deviation, turbinates normal Oral/Pharynx: tongue normal, posterior pharynx without erythema or exudate Chest/Lungs: no rales, wheezes, or rhonchi bilateral, breath sounds equal without effort Heart: regular rate and  rhythm, no murmur MSE:  oriented to time, place, and person Ear bilateral wax impaction (L>R) Post flushing: completely clean, no wax, no erythema Assessment New Problems: CERUMEN IMPACTION, BILATERAL (ICD-380.4)   Plan New Orders: New Patient Level III [99203] Cerumen Impaction Removal [69210] Planning Comments:   Ear irrigated in clinic today Gave a syringe with blue ear tip to use bimonthly Hydration, Ibuprofen Follow-up with your primary care physician if not improving or if getting worse   The patient and/or caregiver has been counseled thoroughly with regard to medications prescribed including dosage, schedule, interactions, rationale for use, and possible side effects and they verbalize understanding.  Diagnoses and expected course of recovery discussed and will return if not improved as expected or if the condition worsens. Patient and/or caregiver verbalized understanding.   Orders Added: 1)  New Patient Level III [09811] 2)  Cerumen Impaction Removal [69210]

## 2010-10-03 ENCOUNTER — Other Ambulatory Visit: Payer: Self-pay | Admitting: Dermatology

## 2010-10-29 NOTE — Op Note (Signed)
[name], Stanley Meyers              ACCOUNT NO.:  [id number]   MEDICAL RECORD NO.:  [id number]          PATIENT TYPE:  AMB   LOCATION:  SDS                          FACILITY:  [facility]   PHYSICIAN:  Quita Skye. Hart Rochester, M.D.  DATE OF BIRTH:  12/03/1924   DATE OF PROCEDURE:  11/04/2006  DATE OF DISCHARGE:  11/04/2006                               OPERATIVE REPORT   PREOPERATIVE DIAGNOSIS:  End-stage renal disease.   POSTOPERATIVE DIAGNOSIS:  End-stage renal disease.   OPERATION:  Creation of a right radial artery to cephalic vein AV  fistula (Cimino shunt).   SURGEON:  Quita Skye. Hart Rochester, M.D.   FIRST ASSISTANT:  Rowe Clack, P.A.-C.   ANESTHESIA:  Local.   DESCRIPTION OF PROCEDURE:  The patient was taken to the operating room  and placed in supine position at which time the right upper extremity  was prepped with Betadine scrub and solution and draped in the routine  sterile manner.  After infiltration with 1% Xylocaine, a longitudinal  incision was made proximal to the wrist, cephalic vein dissected free,  ligated distally and transected.  It was gently dilated with heparinized  saline.  It was about 2.5 to 3 mm in size.  The artery was exposed and  encircled with vessel loops and occluded proximally and distally.  A  longitudinal opening in the artery with a 15 blade was made with the  Pott's scissors extended proximally and distally.  The vein was then  carefully measured, spatulated and anastomosed end-to-side with 6-0  Prolene.  The fistula was then opened and there was a palpable pulse and  thrill and good flow up to the antecubital area.  Adequate hemostasis  was achieved and the wound closed in layers with Vicryl in a  subcuticular fashion.  Sterile dressing was applied and the patient was  taken to the recovery room in satisfactory condition.      Quita Skye Hart Rochester, M.D.  Electronically Signed     JDL/MEDQ  D:  11/04/2006  T:  11/04/2006  Job:  [id number]

## 2010-10-29 NOTE — Assessment & Plan Note (Signed)
OFFICE VISIT   Stanley Meyers, Stanley Meyers  DOB:  Jan 08, 1925                                       12/29/2006  CHART#:[id number]   REASON FOR VISIT:  The patient returns now 2 months post-creation of a  right radial artery to cephalic vein AV fistula (Cimino-shunt).   ASSESSMENT:  The fistula is functioning nicely with an excellent pulse  and a palpable thrill and a nicely dilated vein in the right forearm.  There is no evidence of any steal distally.  He has a well-perfused  right hand.  He was reassured regarding this and this should provide a  nice site for access when and if this becomes necessary.   Quita Skye Hart Rochester, M.D.  Electronically Signed   JDL/MEDQ  D:  12/29/2006  T:  12/30/2006  Job:  [id number]   cc:   D[facility] Salvia. Eliott Nine, M.D.

## 2010-11-07 ENCOUNTER — Encounter: Payer: Self-pay | Admitting: Family Medicine

## 2010-11-13 ENCOUNTER — Telehealth: Payer: Self-pay | Admitting: Family Medicine

## 2010-11-13 DIAGNOSIS — R609 Edema, unspecified: Secondary | ICD-10-CM

## 2010-11-13 DIAGNOSIS — I10 Essential (primary) hypertension: Secondary | ICD-10-CM

## 2010-11-13 MED ORDER — AMLODIPINE BESYLATE 5 MG PO TABS: 5.0000 mg | ORAL_TABLET | Freq: Every day | ORAL | Status: DC

## 2010-11-13 MED ORDER — FUROSEMIDE 20 MG PO TABS: 20.0000 mg | ORAL_TABLET | Freq: Every day | ORAL | Status: DC

## 2010-11-13 NOTE — Telephone Encounter (Signed)
Pt called and needed 90 day refills of his amlodipine and lasix .  Refilled for #90 /2 refills till he'll be due for medicare CPE 05-30-11. Jarvis Newcomer, LPN Domingo Dimes

## 2010-11-27 ENCOUNTER — Encounter: Payer: Self-pay | Admitting: Family Medicine

## 2010-11-27 ENCOUNTER — Ambulatory Visit (INDEPENDENT_AMBULATORY_CARE_PROVIDER_SITE_OTHER): Payer: BC Managed Care – PPO | Admitting: Family Medicine

## 2010-11-27 VITALS — BP 122/69 | HR 66 | Wt 133.0 lb

## 2010-11-27 DIAGNOSIS — H6120 Impacted cerumen, unspecified ear: Secondary | ICD-10-CM

## 2010-11-27 NOTE — Progress Notes (Signed)
  Subjective:    Patient ID: Stanley Meyers, male    DOB: [birth year], 77 y.o.   MRN: [id number]  HPI  77 yo WM presents for cerumen removal both ears.  Had muffled hearing, little pain.  BP 122/69  Pulse 66  Wt 133 lb (60.328 kg)  SpO2 98%  Review of Systems     Objective:   Physical Exam  HENT:  Right Ear: Hearing, tympanic membrane, external ear and ear canal normal.  Left Ear: Hearing, tympanic membrane, external ear and ear canal normal.          Assessment & Plan:

## 2010-11-27 NOTE — Assessment & Plan Note (Signed)
Successfully irrigated both ears today.  TMs look great.  Hearing restored.

## 2010-12-09 ENCOUNTER — Other Ambulatory Visit: Payer: Self-pay | Admitting: Family Medicine

## 2010-12-09 NOTE — Telephone Encounter (Signed)
Pt needs 90 day refill lovastatin to [facility]. Pt should still have medication til 12-26-10.  Pt informed and will call back closer to that time to request refill. Jarvis Newcomer, LPN Domingo Dimes

## 2010-12-10 ENCOUNTER — Other Ambulatory Visit: Payer: Self-pay | Admitting: Family Medicine

## 2010-12-10 NOTE — Telephone Encounter (Signed)
Pt requesting lovastatin 90 day refill, but it looks like he made need lab work.  Has been 6 mths or more since last lab draw.  Can do 30 day and do labs and then send 90 day. Jarvis Newcomer, LPN Domingo Dimes

## 2010-12-11 ENCOUNTER — Other Ambulatory Visit: Payer: Self-pay | Admitting: Family Medicine

## 2010-12-11 MED ORDER — LOVASTATIN 20 MG PO TABS: 20.0000 mg | ORAL_TABLET | Freq: Every day | ORAL | Status: DC

## 2010-12-11 NOTE — Telephone Encounter (Signed)
Closed

## 2010-12-12 ENCOUNTER — Telehealth: Payer: Self-pay | Admitting: Family Medicine

## 2010-12-12 NOTE — Telephone Encounter (Signed)
Closed

## 2010-12-13 ENCOUNTER — Inpatient Hospital Stay (INDEPENDENT_AMBULATORY_CARE_PROVIDER_SITE_OTHER)
Admission: RE | Admit: 2010-12-13 | Discharge: 2010-12-13 | Disposition: A | Discharge status: Home / Self Care | Payer: BC Managed Care – PPO | Source: Ambulatory Visit | Attending: Emergency Medicine | Admitting: Emergency Medicine

## 2010-12-13 ENCOUNTER — Encounter: Payer: Self-pay | Admitting: Emergency Medicine

## 2010-12-13 DIAGNOSIS — H9209 Otalgia, unspecified ear: Secondary | ICD-10-CM

## 2011-01-08 ENCOUNTER — Other Ambulatory Visit: Payer: Self-pay | Admitting: Family Medicine

## 2011-01-08 MED ORDER — OMEPRAZOLE 40 MG PO CPDR: 40.0000 mg | DELAYED_RELEASE_CAPSULE | Freq: Every day | ORAL | Status: DC

## 2011-03-24 ENCOUNTER — Other Ambulatory Visit: Payer: Self-pay | Admitting: Family Medicine

## 2011-03-24 ENCOUNTER — Telehealth: Payer: Self-pay | Admitting: Family Medicine

## 2011-03-24 MED ORDER — LOVASTATIN 20 MG PO TABS: 20.0000 mg | ORAL_TABLET | Freq: Every day | ORAL | Status: DC

## 2011-03-24 NOTE — Telephone Encounter (Signed)
Wife called and requested refill for lovastatin 20 mg to be sent to [facility]. Plan:  Last CPE Medicare Wellness Exam 05-26-2010 and last chol panel at the same time. Will send 90 day supply and then pt will need to do CPE and fasting labwork for his chol. Jarvis Newcomer, LPN Domingo Dimes

## 2011-03-25 NOTE — Telephone Encounter (Signed)
Closed

## 2011-04-04 ENCOUNTER — Other Ambulatory Visit: Payer: Self-pay | Admitting: Dermatology

## 2011-04-08 ENCOUNTER — Other Ambulatory Visit: Payer: Self-pay | Admitting: Family Medicine

## 2011-04-08 MED ORDER — OMEPRAZOLE 40 MG PO CPDR: 40.0000 mg | DELAYED_RELEASE_CAPSULE | Freq: Every day | ORAL | Status: DC

## 2011-04-08 NOTE — Telephone Encounter (Signed)
Pt's wife called for refill of the pt's omeprazole 40 mg medication. Plan:  90 day supply sent to [facility], then pt will be due for CPE medicare wellness exam. Jarvis Newcomer, LPN Domingo Dimes

## 2011-05-01 ENCOUNTER — Encounter: Payer: Self-pay | Admitting: *Deleted

## 2011-05-01 ENCOUNTER — Emergency Department
Admission: EM | Admit: 2011-05-01 | Discharge: 2011-05-01 | Disposition: A | Discharge status: Home / Self Care | Payer: BC Managed Care – PPO | Source: Home / Self Care | Attending: Family Medicine | Admitting: Family Medicine

## 2011-05-01 DIAGNOSIS — H6120 Impacted cerumen, unspecified ear: Secondary | ICD-10-CM

## 2011-05-01 HISTORY — DX: Hyperlipidemia, unspecified: E78.5

## 2011-05-01 HISTORY — DX: Essential (primary) hypertension: I10

## 2011-05-01 HISTORY — DX: Unspecified kidney failure: N19

## 2011-05-01 HISTORY — DX: Gastro-esophageal reflux disease without esophagitis: K21.9

## 2011-05-01 NOTE — ED Provider Notes (Signed)
History     CSN: [id number] Arrival date & time: 05/01/2011 11:48 AM   First MD Initiated Contact with Patient 05/01/11 1148      Chief Complaint  Patient presents with  . Otalgia     HPI Comments: Patient complains of left ear feeling clogged two days ago, with only mild discomfort  Patient is a 77 y.o. male presenting with ear pain. The history is provided by the patient.  Otalgia This is a new problem. The current episode started 2 days ago. There is pain in the left ear. The problem occurs constantly. The problem has not changed since onset.There has been no fever. Associated symptoms include hearing loss. Pertinent negatives include no ear discharge, no headaches, no rhinorrhea, no sore throat, no abdominal pain, no diarrhea, no vomiting, no neck pain, no cough and no rash.    Past Medical History  Diagnosis Date  . Kidney failure   . GERD (gastroesophageal reflux disease)   . Hypertension   . Hyperlipemia     Past Surgical History  Procedure Date  . Cholecystectomy   . Hernia repair   . Av fistula placement     History reviewed. No pertinent family history.  History  Substance Use Topics  . Smoking status: Former Games developer  . Smokeless tobacco: Not on file  . Alcohol Use: 1.2 oz/week    2 Glasses of wine per week      Review of Systems  Constitutional: Negative.   HENT: Positive for hearing loss and ear pain. Negative for sore throat, rhinorrhea, neck pain and ear discharge.   Eyes: Negative.   Respiratory: Negative for cough.   Cardiovascular: Negative.   Gastrointestinal: Negative.  Negative for vomiting, abdominal pain and diarrhea.  Genitourinary: Negative.   Skin: Negative for rash.  Neurological: Negative for headaches.    Allergies  Review of patient's allergies indicates no known allergies.  Home Medications   Current Outpatient Rx  Name Route Sig Dispense Refill  . AMLODIPINE BESYLATE 5 MG PO TABS Oral Take 1 tablet (5 mg total) by mouth  daily. 90 tablet 2  . CALCITRIOL 0.25 MCG PO CAPS Oral Take 0.25 mcg by mouth.      . FUROSEMIDE 20 MG PO TABS Oral Take 1 tablet (20 mg total) by mouth daily. 90 tablet 2  . LOVASTATIN 20 MG PO TABS Oral Take 1 tablet (20 mg total) by mouth at bedtime. 90 tablet 0  . OMEPRAZOLE 40 MG PO CPDR Oral Take 1 capsule (40 mg total) by mouth daily. 90 capsule 0    BP 124/66  Pulse 69  Temp(Src) 98.1 F (36.7 C) (Oral)  Resp 16  Ht 5\' 7"  (1.702 m)  Wt 131 lb 4 oz (59.535 kg)  BMI 20.56 kg/m2  Physical Exam  Constitutional: He appears well-developed and well-nourished. No distress.  HENT:  Head: Normocephalic.  Nose: Nose normal.  Mouth/Throat: Oropharynx is clear and moist.       Both canals occluded with cerumen.  Unable to visualize tympanic membranes.  Eyes: Conjunctivae and EOM are normal. Pupils are equal, round, and reactive to light.  Neck: Neck supple.  Cardiovascular: Normal rate and normal heart sounds.   Pulmonary/Chest: Breath sounds normal.    ED Course  Procedures  Bilateral ear lavage      1. Cerumen impaction       MDM  Post lavage, both tympanic membranes appear normal.  Canals appear normal.        Donna Christen,  MD 05/03/11 2211

## 2011-05-01 NOTE — ED Notes (Signed)
Pt c/o LT ear ache x 2-3 days.

## 2011-05-19 NOTE — Progress Notes (Signed)
Summary: EAR ACHE Room 4   Vital Signs:  Patient Profile:   77 Years Old Male CC:      Left ear pain x 2 days Height:     68.75 inches Weight:      131 pounds O2 Sat:      97 % O2 treatment:    Room Air Temp:     97.7 degrees F oral Pulse rate:   64 / minute Pulse rhythm:   regular Resp:     16 per minute BP sitting:   131 / 62  (left arm) Cuff size:   regular  Vitals Entered By: Emilio Math (December 13, 2010 11:50 AM)                  Current Allergies: No known allergies History of Present Illness History from: patient Chief Complaint: Left ear pain x 2 days History of Present Illness: L ear pain for a few days.  Frequently has wax buildup but a few weeks ago Dr. Cathey Endow cleaned it out.  No other URI symptoms.  No F/C/N/V.  Not using any OTC meds other than saline rinse.  He reports that the ear pain is improved today.  Current Meds CALCITRIOL 0.25 MCG CAPS (CALCITRIOL) 1 capsule by mouth daily LASIX 20 MG TABS (FUROSEMIDE) 1 tab by mouth daily LOVASTATIN 20 MG TABS (LOVASTATIN) 1 tab by mouth qhs MULTIVITAMINS  TABS (MULTIPLE VITAMIN) 1 tab by mouth daily NORVASC 5 MG TABS (AMLODIPINE BESYLATE) 1 tab by mouth daily OMEPRAZOLE 40 MG CPDR (OMEPRAZOLE) 1 tab by mouth daily TYLENOL EXTRA STRENGTH 500 MG TABS (ACETAMINOPHEN) 2 tabs by mouth tid [employer] NETI POT NASAL WASH  PACK (HYPERTONIC NASAL WASH)  NEOMYCIN-POLYMYXIN-HC 3.5-10000-1 SUSP (NEOMYCIN-POLYMYXIN-HC) 4 drops L ear two times a day for 5 days  REVIEW OF SYSTEMS Constitutional Symptoms      Denies fever, chills, night sweats, weight loss, weight gain, and fatigue.  Eyes       Denies change in vision, eye pain, eye discharge, glasses, contact lenses, and eye surgery. Ear/Nose/Throat/Mouth       Complains of ear pain.      Denies hearing loss/aids, change in hearing, ear discharge, dizziness, frequent runny nose, frequent nose bleeds, sinus problems, sore throat, hoarseness, and tooth pain or bleeding.    Respiratory       Denies dry cough, productive cough, wheezing, shortness of breath, asthma, bronchitis, and emphysema/COPD.  Cardiovascular       Denies murmurs, chest pain, and tires easily with exhertion.    Gastrointestinal       Denies stomach pain, nausea/vomiting, diarrhea, constipation, blood in bowel movements, and indigestion. Genitourniary       Denies painful urination, kidney stones, and loss of urinary control. Neurological       Denies paralysis, seizures, and fainting/blackouts. Musculoskeletal       Denies muscle pain, joint pain, joint stiffness, decreased range of motion, redness, swelling, muscle weakness, and gout.  Skin       Denies bruising, unusual mles/lumps or sores, and hair/skin or nail changes.  Psych       Denies mood changes, temper/anger issues, anxiety/stress, speech problems, depression, and sleep problems.  Past History:  Past Medical History: Reviewed history from 10/02/2008 and no changes required. stage 4 CKD Cr 3.0 -- sees Dr Eliott Nine Anemia, on Procrit since 2006  --2009 HTN knee OA s/p Synvisc treatments hx of asbestos exposure  hx of BCC (Dr Emily Filbert)  hx of urethral stricture  hx of NSAID use  Past Surgical History: Reviewed history from 04/07/2008 and no changes required. Cholecystectomy  Hernia Repair  R knee arthroscopy renal angiogram R AV fistula 11-04-07  Family History: Reviewed history from 04/10/2006 and no changes required. 8 sibblings alive father unknown  mother died at 85 of DM, HTN  sister CKD  Social History: Reviewed history from 03/24/2006 and no changes required. Retired.  Married to Pioneer, has 8 kids, daughter Corrie Dandy in [place].  Active in church.  Walks 3 x a wk.  Quit smoking in 1960s. Physical Exam General appearance: well developed, well nourished, no acute distress Ears: normal, no lesions or deformities.  mild cerumen L, no erythema Nasal: clear discharge Oral/Pharynx: tongue normal, posterior pharynx  without erythema or exudate Chest/Lungs: no rales, wheezes, or rhonchi bilateral, breath sounds equal without effort Heart: regular rate and  rhythm, no murmur MSE: oriented to time, place, and person Assessment New Problems: EAR PAIN (ICD-388.70)   Patient Education: Patient and/or caregiver instructed in the following: rest, fluids.  Plan New Medications/Changes: NEOMYCIN-POLYMYXIN-HC 3.5-10000-1 SUSP (NEOMYCIN-POLYMYXIN-HC) 4 drops L ear two times a day for 5 days  #QS x 0, 12/13/2010, Hoyt Koch MD  New Orders: Est. Patient Level III [phone] Planning Comments:   Pain likely due to congestion, although will try ear drops for a few days to see if that helps the irritation Hydration, Ibuprofen Follow-up with your primary care physician if not improving or if getting worse   The patient and/or caregiver has been counseled thoroughly with regard to medications prescribed including dosage, schedule, interactions, rationale for use, and possible side effects and they verbalize understanding.  Diagnoses and expected course of recovery discussed and will return if not improved as expected or if the condition worsens. Patient and/or caregiver verbalized understanding.  Prescriptions: NEOMYCIN-POLYMYXIN-HC 3.5-10000-1 SUSP (NEOMYCIN-POLYMYXIN-HC) 4 drops L ear two times a day for 5 days  #QS x 0   Entered and Authorized by:   Hoyt Koch MD   Signed by:   Hoyt Koch MD on 12/13/2010   Method used:   Print then Give to Patient   RxID:   [id number]   Orders Added: 1)  Est. Patient Level III [95621]

## 2011-05-29 ENCOUNTER — Other Ambulatory Visit: Payer: Self-pay | Admitting: *Deleted

## 2011-05-29 MED ORDER — LOVASTATIN 20 MG PO TABS: 20.0000 mg | ORAL_TABLET | Freq: Every day | ORAL | Status: DC

## 2011-06-27 ENCOUNTER — Encounter: Payer: Self-pay | Admitting: Family Medicine

## 2011-06-27 ENCOUNTER — Telehealth: Payer: Self-pay | Admitting: Family Medicine

## 2011-06-27 ENCOUNTER — Ambulatory Visit (INDEPENDENT_AMBULATORY_CARE_PROVIDER_SITE_OTHER): Payer: BC Managed Care – PPO | Admitting: Family Medicine

## 2011-06-27 ENCOUNTER — Ambulatory Visit
Admission: RE | Admit: 2011-06-27 | Discharge: 2011-06-27 | Disposition: A | Discharge status: Home / Self Care | Payer: BC Managed Care – PPO | Source: Ambulatory Visit | Attending: Family Medicine | Admitting: Family Medicine

## 2011-06-27 VITALS — BP 109/58 | HR 67 | Wt 129.0 lb

## 2011-06-27 DIAGNOSIS — M545 Low back pain, unspecified: Secondary | ICD-10-CM

## 2011-06-27 DIAGNOSIS — W108XXA Fall (on) (from) other stairs and steps, initial encounter: Secondary | ICD-10-CM

## 2011-06-27 DIAGNOSIS — K5900 Constipation, unspecified: Secondary | ICD-10-CM

## 2011-06-27 NOTE — Telephone Encounter (Signed)
Call dr. Camille Bal (nephrologyh inb [place]) and see if OK for pt to have a shingles vaccine. Thank you.

## 2011-06-27 NOTE — Patient Instructions (Signed)
We will call you with your results Check on when you had your last pneumonia vaccine.

## 2011-06-27 NOTE — Progress Notes (Signed)
  Subjective:    Patient ID: Stanley Meyers, male    DOB: 1925-03-06, 78 y.o.   MRN: [id number]  HPI A [place] feel in the airport [place] behind him.  Then feel down the step at his townhouse a few months backl. But then a month ago slid down the stair, had socks on.  Since then has noticed some low back pain that is intermittant.  No pain with certina movement.  Sometimes worse when bends over. Doesn't wake him up at night.  Already take Tyelnol 4 tabs daily for general arthritis.  Has kidney dz.  No prior hx of surgery to his back.   He also mentioned a change in his bowel movements. He notices he is no longer going every day. He is now going every other day and has had to use suppositories a couple of times. He says he hasn't consider echo in the past who recommended increasing the fiber in his diet. His last colonoscopy was about 5 years ago. He denies any blood in the stool.  Review of Systems     Objective:   Physical Exam  Constitutional: He is oriented to person, place, and time. He appears well-developed and well-nourished.  HENT:  Head: Normocephalic and atraumatic.  Cardiovascular: Normal rate, regular rhythm and normal heart sounds.   Pulmonary/Chest: Effort normal and breath sounds normal.  Musculoskeletal:       Normal flexion, extension, rotation right and left, side bending of the lumbar spine. He is nontender over the lumbar or lower thoracic spine. Nontender over the SI joints or paraspinous muscles. Negative straight leg raise bilaterally. Lower extremity strength is symmetric and 5 over 5 bilaterally.  Neurological: He is alert and oriented to person, place, and time.       Normal gait.  Skin: Skin is warm and dry.  Psychiatric: He has a normal mood and affect. His behavior is normal.          Assessment & Plan:  Lumbar back pain s/p fall down the stairs.- will get xray today. Will call with results. Concern for possible compression fracture. He can  certainly take an extra Tylenol or 2 during the daytime if he needs to for his low back. If his x-ray is normal then consider physical therapy for his lumbar spine.  Constipation-discussed increasing his fiber with a product such as Metamucil or Benefiber in addition to increasing his fruits and vegetables. Based on his exam a low potassium diet because of his renal failure. We also discussed taking a daily stool softener such as Dulcolax which does not allow or stimulant but to take it on a regular basis to help keep his bowels were normal. If this is not making any significant improvement in the next 3-4 weeks and I do recommend that he see GI for further evaluation.

## 2011-06-27 NOTE — Telephone Encounter (Signed)
LM at Dr. Elza Rafter office to return call

## 2011-06-30 ENCOUNTER — Other Ambulatory Visit: Payer: Self-pay | Admitting: Family Medicine

## 2011-06-30 ENCOUNTER — Other Ambulatory Visit: Payer: Self-pay | Admitting: *Deleted

## 2011-06-30 DIAGNOSIS — M8580 Other specified disorders of bone density and structure, unspecified site: Secondary | ICD-10-CM

## 2011-06-30 MED ORDER — OMEPRAZOLE 40 MG PO CPDR: 40.0000 mg | DELAYED_RELEASE_CAPSULE | Freq: Every day | ORAL | Status: DC

## 2011-07-01 ENCOUNTER — Telehealth: Payer: Self-pay | Admitting: *Deleted

## 2011-07-01 MED ORDER — AMBULATORY NON FORMULARY MEDICATION: Status: DC

## 2011-07-01 NOTE — Telephone Encounter (Addendum)
Awesome! Please call pt and let him kow Dr. Eliott Nine says OK.  I will print rx for him to pick up.

## 2011-07-01 NOTE — Telephone Encounter (Signed)
Stanton Kidney states that is ok for the pt to receive shingles vaccine per Dr. Eliott Nine (nephrology)

## 2011-07-01 NOTE — Telephone Encounter (Signed)
Addended by: Nani Gasser D on: 07/01/2011 08:22 PM   Modules accepted: Orders

## 2011-07-02 NOTE — Telephone Encounter (Signed)
Pt informed to come by and pick up rx for shingles vaccine.

## 2011-07-04 ENCOUNTER — Other Ambulatory Visit: Payer: Self-pay | Admitting: *Deleted

## 2011-07-04 ENCOUNTER — Other Ambulatory Visit: Payer: Self-pay | Admitting: Family Medicine

## 2011-07-04 MED ORDER — AMBULATORY NON FORMULARY MEDICATION: Status: DC

## 2011-07-25 ENCOUNTER — Ambulatory Visit (INDEPENDENT_AMBULATORY_CARE_PROVIDER_SITE_OTHER): Payer: BC Managed Care – PPO | Admitting: Family Medicine

## 2011-07-25 ENCOUNTER — Ambulatory Visit
Admission: RE | Admit: 2011-07-25 | Discharge: 2011-07-25 | Disposition: A | Discharge status: Home / Self Care | Payer: BC Managed Care – PPO | Source: Ambulatory Visit | Attending: Family Medicine | Admitting: Family Medicine

## 2011-07-25 ENCOUNTER — Encounter: Payer: Self-pay | Admitting: Family Medicine

## 2011-07-25 VITALS — BP 100/53 | HR 73 | Wt 131.0 lb

## 2011-07-25 DIAGNOSIS — M25569 Pain in unspecified knee: Secondary | ICD-10-CM

## 2011-07-25 DIAGNOSIS — I10 Essential (primary) hypertension: Secondary | ICD-10-CM

## 2011-07-25 DIAGNOSIS — R609 Edema, unspecified: Secondary | ICD-10-CM

## 2011-07-25 DIAGNOSIS — W19XXXA Unspecified fall, initial encounter: Secondary | ICD-10-CM

## 2011-07-25 MED ORDER — FUROSEMIDE 20 MG PO TABS: 20.0000 mg | ORAL_TABLET | Freq: Every day | ORAL | Status: DC

## 2011-07-25 MED ORDER — AMLODIPINE BESYLATE 5 MG PO TABS: 5.0000 mg | ORAL_TABLET | Freq: Every day | ORAL | Status: DC

## 2011-07-25 NOTE — Progress Notes (Signed)
  Subjective:    Patient ID: Stanley Meyers, male    DOB: [birth year], 78 y.o.   MRN: [id number]  HPI Larey Seat 2 days ago.  Right knee pain s/p fall and directly onto his right knee.  Hx of laparoscope surgery previously.  Mild swelling.  Icing it.  Taking Tylenol for pain relief.  Worse when stands for awhile.    HTN- No CP or SOB. Needs refills.  He is consistently taking his medications. He does avoid NSAIDs. No dizziness or lightheadedness.  Review of Systems     Objective:   Physical Exam  Right knee with with normal ROM. strenght is 5/5.  No crepitis.  He is nontender along the joint lines. 1+ edema. No erythema or break in the skin. No bruising.      Assessment & Plan:  Right Knee Pain- Xray to rule out fracture.  If normla give it a month to recover. continue to ice bid and gentle stretches.  Call if not bette rin one month and consider referral to orhto for steroid injection.   HTN - well controlled. Refill sent. Followup in 6 months.

## 2011-07-25 NOTE — Patient Instructions (Signed)
We will call with your xray results.

## 2011-07-25 NOTE — Telephone Encounter (Signed)
Patient wife says he had it in 2008

## 2011-08-08 ENCOUNTER — Other Ambulatory Visit: Payer: Self-pay | Admitting: *Deleted

## 2011-08-08 MED ORDER — CALCITRIOL 0.25 MCG PO CAPS: 0.2500 ug | ORAL_CAPSULE | ORAL | Status: DC

## 2011-08-12 ENCOUNTER — Inpatient Hospital Stay: Admission: RE | Admit: 2011-08-12 | Payer: BC Managed Care – PPO | Source: Ambulatory Visit

## 2011-09-08 ENCOUNTER — Encounter: Payer: Self-pay | Admitting: Family Medicine

## 2011-10-02 ENCOUNTER — Other Ambulatory Visit: Payer: Self-pay | Admitting: *Deleted

## 2011-10-02 MED ORDER — OMEPRAZOLE 40 MG PO CPDR: 40.0000 mg | DELAYED_RELEASE_CAPSULE | Freq: Every day | ORAL | Status: DC

## 2011-10-10 ENCOUNTER — Other Ambulatory Visit: Payer: Self-pay | Admitting: Dermatology

## 2011-10-14 ENCOUNTER — Encounter: Payer: Self-pay | Admitting: Family Medicine

## 2011-10-14 ENCOUNTER — Ambulatory Visit (INDEPENDENT_AMBULATORY_CARE_PROVIDER_SITE_OTHER): Payer: BC Managed Care – PPO | Admitting: Family Medicine

## 2011-10-14 ENCOUNTER — Other Ambulatory Visit: Payer: Self-pay | Admitting: Family Medicine

## 2011-10-14 VITALS — BP 109/62 | HR 61 | Temp 97.6°F | Ht 66.0 in | Wt 129.0 lb

## 2011-10-14 DIAGNOSIS — R1013 Epigastric pain: Secondary | ICD-10-CM

## 2011-10-14 DIAGNOSIS — R197 Diarrhea, unspecified: Secondary | ICD-10-CM

## 2011-10-14 DIAGNOSIS — R140 Abdominal distension (gaseous): Secondary | ICD-10-CM

## 2011-10-14 DIAGNOSIS — R198 Other specified symptoms and signs involving the digestive system and abdomen: Secondary | ICD-10-CM

## 2011-10-14 DIAGNOSIS — R141 Gas pain: Secondary | ICD-10-CM

## 2011-10-14 NOTE — Patient Instructions (Addendum)
We will call you with your lab results. If you don't here from [place] in about a week then please give [place] a call at [phone]. You should consider a probiotic- Florastor, Align are examples.   Consider lactose-free or gluten -free diet.

## 2011-10-14 NOTE — Progress Notes (Signed)
  Subjective:    Patient ID: Stanley Meyers, male    DOB: 02-02-1925, 78 y.o.   MRN: [id number]  HPI INtermittant contipation and diarrhea. Tried stool softeners daily for 2 weeks but then caused diarrhea.  Has been on prilosec for a long time.  Says does use glycerine supp at times when can't go. But then will diarrhea.   Sometimes rich foods will cause loose stools.  No heartburn.  Still some gas and bloating.  Last colonoscopy was 5 years ago.  Will occ wake up in the middle of the night hungry and will sometimes eat yogurt, or crackers.   No blood in the stool.     Review of Systems     Objective:   Physical Exam  Constitutional: He is oriented to person, place, and time. He appears well-developed and well-nourished.  HENT:  Head: Normocephalic and atraumatic.  Cardiovascular: Normal rate, regular rhythm and normal heart sounds.   Pulmonary/Chest: Effort normal and breath sounds normal.  Abdominal: Soft. Bowel sounds are normal. He exhibits no distension and no mass. There is tenderness. There is no rebound and no guarding.       Tender over the epigastrum.   Neurological: He is alert and oriented to person, place, and time.  Skin: Skin is warm and dry.  Psychiatric: He has a normal mood and affect. His behavior is normal.          Assessment & Plan:  Change in bowels.  - consider probiotic to better regulate his bowels..  Consider lactose intolerance and gluten intolerance.  We will do blood tests to evaluate for this. Also check his thyroid to make sure it is well controlled. He did do testing is normal we discussed that he might want to consider a lactose-free or gluten free diet for 3-4 weeks to see if it might still improve his symptoms. May need repeat colonoscopy  I am not as concerned about the weight loss, because per our scale he has only lost 1 pound. And he tends to drift up and down by couple of pounds. Also consider that he may have irritable bowel syndrome. His  description sounds very consistent with this. He doesn't make his mood has been down a little bit recently but does not want to take medication for it. Also check a CMP for liver enzymes since he is mildly tender in epigastrium today. He denies any actual heartburn type symptoms and he takes a PPI. It is tenderness in that area has been there for years.

## 2011-10-15 LAB — GLIA (IGA/G) + TTG IGA
Gliadin IgA: 4.3 U/mL (ref <20)
Tissue Transglutaminase Ab, IgA: 3.7 U/mL (ref <20)

## 2011-10-15 LAB — ALLERGEN MILK: Milk IgE: <0.1 kU/L (ref <0.35)

## 2011-10-15 LAB — HEPATIC FUNCTION PANEL
Albumin: 4.3 g/dL (ref 3.5–5.2)
Total Bilirubin: 0.6 mg/dL (ref 0.3–1.2)

## 2011-10-15 LAB — HEMOGLOBIN A1C
Hgb A1c MFr Bld: 5.8 % — ABNORMAL HIGH (ref <5.7)
Mean Plasma Glucose: 120 mg/dL — ABNORMAL HIGH (ref <117)

## 2011-10-15 LAB — CBC WITH DIFFERENTIAL/PLATELET
Basophils Absolute: 0 10*3/uL (ref 0.0–0.1)
Lymphocytes Relative: 19 % (ref 12–46)
Neutro Abs: 5 10*3/uL (ref 1.7–7.7)
Platelets: 140 10*3/uL — ABNORMAL LOW (ref 150–400)
RDW: 15 % (ref 11.5–15.5)
WBC: 6.9 10*3/uL (ref 4.0–10.5)

## 2011-10-16 ENCOUNTER — Other Ambulatory Visit: Payer: Self-pay | Admitting: Family Medicine

## 2011-10-16 DIAGNOSIS — R198 Other specified symptoms and signs involving the digestive system and abdomen: Secondary | ICD-10-CM

## 2011-10-16 LAB — IRON: Iron: 49 ug/dL (ref 42–165)

## 2011-10-28 ENCOUNTER — Encounter: Payer: Self-pay | Admitting: *Deleted

## 2011-11-06 ENCOUNTER — Encounter: Payer: Self-pay | Admitting: Family Medicine

## 2011-11-24 ENCOUNTER — Other Ambulatory Visit: Payer: Self-pay | Admitting: *Deleted

## 2011-11-24 MED ORDER — CALCITRIOL 0.25 MCG PO CAPS: 0.2500 ug | ORAL_CAPSULE | ORAL | Status: DC

## 2012-01-02 LAB — BASIC METABOLIC PANEL
BUN: 45 mg/dL — AB (ref 4–21)
Creatinine: 2.1 mg/dL — AB (ref 0.6–1.3)
Glucose: 95 mg/dL
Potassium: 4.3 mmol/L (ref 3.4–5.3)

## 2012-01-02 LAB — CBC AND DIFFERENTIAL: HCT: 34 % — AB (ref 41–53)

## 2012-01-07 ENCOUNTER — Ambulatory Visit (INDEPENDENT_AMBULATORY_CARE_PROVIDER_SITE_OTHER): Payer: BC Managed Care – PPO | Admitting: Physician Assistant

## 2012-01-07 ENCOUNTER — Encounter: Payer: Self-pay | Admitting: Physician Assistant

## 2012-01-07 VITALS — BP 118/60 | HR 66 | Ht 66.0 in | Wt 129.0 lb

## 2012-01-07 DIAGNOSIS — R071 Chest pain on breathing: Secondary | ICD-10-CM

## 2012-01-07 DIAGNOSIS — R0789 Other chest pain: Secondary | ICD-10-CM

## 2012-01-07 DIAGNOSIS — H6120 Impacted cerumen, unspecified ear: Secondary | ICD-10-CM

## 2012-01-07 MED ORDER — AMBULATORY NON FORMULARY MEDICATION: Status: DC

## 2012-01-07 NOTE — Progress Notes (Signed)
  Subjective:    Patient ID: Stanley Meyers, male    DOB: October 04, 1924, 78 y.o.   MRN: [id number]  HPI Patient presents to the clinic with right upper chest wall pain the last 2 days. The pain is not constant but it off and on. He reports that he'll feel a twinge and then it will go away and not come back for another hour to 12 hours. He denies any nipple discharge. He denies any rash. Does not hurt with palpation. His wife is concerned that he might be getting shingles because he has not had the vaccine. He uses Tylenol every day and has not helped the pain. He cannot take ibuprofen do to renal disease. She has been more active lately and doing more things with his upper body. Denies any injury that he knows of. Denies any chest pain, palpitations, shortness of breath, jaw pain, or dizziness. Denies any fever or feeling bad anywhere else. His ears do feel stopped up and has a history of needing his ears cleaned out.    Review of Systems     Objective:   Physical Exam  Constitutional: He is oriented to person, place, and time. He appears well-developed and well-nourished.  HENT:  Head: Normocephalic.       Bilateral ears were impacted with cerumen. After removal TMs normal.  Cardiovascular: Normal rate, regular rhythm and normal heart sounds.   Pulmonary/Chest: Effort normal and breath sounds normal.  Musculoskeletal:       No tenderness with palpation of the pectoralis muscles bilaterally. No changes in the nipple of either breast. No masses felt with palpation of both breasts. No rashes or erythema present.  Neurological: He is alert and oriented to person, place, and time.  Skin: Skin is warm and dry.  Psychiatric: He has a normal mood and affect. His behavior is normal.          Assessment & Plan:  Right-sided chest wall pain/cerumen impaction-reassure patient that this did not seem to be shingles since there was no rash and the pain was so off and on. Continue to take Tylenol  daily. Try to rest for the next couple of days and not be so active; therefore, if this is muscular in origin it will have time to heal. This is most likely some type of inflammation of the cartilage or muscles of the chest. Call office if pain becomes more constant or severe or last for more than 3 weeks. Bilateral ears were cleaned out today.

## 2012-01-07 NOTE — Patient Instructions (Addendum)
Gave rx for shingles vaccine.  Call office in 3 weeks if pain worsening or not resolved.

## 2012-01-15 ENCOUNTER — Encounter: Payer: Self-pay | Admitting: *Deleted

## 2012-01-21 ENCOUNTER — Other Ambulatory Visit: Payer: Self-pay | Admitting: *Deleted

## 2012-01-21 MED ORDER — OMEPRAZOLE 40 MG PO CPDR: 40.0000 mg | DELAYED_RELEASE_CAPSULE | Freq: Every day | ORAL | Status: DC

## 2012-02-10 ENCOUNTER — Encounter: Payer: Self-pay | Admitting: Family Medicine

## 2012-03-15 ENCOUNTER — Other Ambulatory Visit: Payer: Self-pay | Admitting: *Deleted

## 2012-03-15 MED ORDER — LOVASTATIN 20 MG PO TABS: 20.0000 mg | ORAL_TABLET | Freq: Every day | ORAL | Status: DC

## 2012-04-01 ENCOUNTER — Ambulatory Visit (INDEPENDENT_AMBULATORY_CARE_PROVIDER_SITE_OTHER): Payer: BC Managed Care – PPO | Admitting: Family Medicine

## 2012-04-01 DIAGNOSIS — Z23 Encounter for immunization: Secondary | ICD-10-CM

## 2012-05-18 ENCOUNTER — Encounter: Payer: Self-pay | Admitting: Family Medicine

## 2012-05-18 ENCOUNTER — Ambulatory Visit (INDEPENDENT_AMBULATORY_CARE_PROVIDER_SITE_OTHER): Payer: BC Managed Care – PPO | Admitting: Family Medicine

## 2012-05-18 ENCOUNTER — Other Ambulatory Visit: Payer: Self-pay | Admitting: *Deleted

## 2012-05-18 VITALS — BP 127/60 | HR 73 | Ht 67.5 in | Wt 130.0 lb

## 2012-05-18 DIAGNOSIS — I10 Essential (primary) hypertension: Secondary | ICD-10-CM

## 2012-05-18 DIAGNOSIS — N184 Chronic kidney disease, stage 4 (severe): Secondary | ICD-10-CM

## 2012-05-18 DIAGNOSIS — R609 Edema, unspecified: Secondary | ICD-10-CM

## 2012-05-18 DIAGNOSIS — E785 Hyperlipidemia, unspecified: Secondary | ICD-10-CM

## 2012-05-18 DIAGNOSIS — H6120 Impacted cerumen, unspecified ear: Secondary | ICD-10-CM

## 2012-05-18 MED ORDER — FUROSEMIDE 20 MG PO TABS: 20.0000 mg | ORAL_TABLET | Freq: Every day | ORAL | Status: DC

## 2012-05-18 MED ORDER — AMBULATORY NON FORMULARY MEDICATION: Status: DC

## 2012-05-18 MED ORDER — AMLODIPINE BESYLATE 5 MG PO TABS: 5.0000 mg | ORAL_TABLET | Freq: Every day | ORAL | Status: DC

## 2012-05-18 NOTE — Progress Notes (Signed)
  Subjective:    Patient ID: Stanley Meyers, male    DOB: 04-Dec-1924, 78 y.o.   MRN: [id number]  HPI Stanley Meyers me to check his ears today.   CKD - Has a fistula in the right arm.  Has f/u with Dr. Eliott Nine in December.    HTN - No CP or SOB.   Has a spasmodic stomach and has been following with [facility].  Takes dicyclimine QID.    Review of Systems     Objective:   Physical Exam  Constitutional: He is oriented to person, place, and time. He appears well-developed and well-nourished.  HENT:  Head: Normocephalic and atraumatic.  Cardiovascular: Normal rate, regular rhythm and normal heart sounds.   Pulmonary/Chest: Effort normal and breath sounds normal.  Neurological: He is alert and oriented to person, place, and time.  Skin: Skin is warm and dry.  Psychiatric: He has a normal mood and affect. His behavior is normal.          Assessment & Plan:  HTN - Well controlled.  F/U in 6 months. Doing well. RF sent to mail-order.   Cerumen impaction right ear- Removed with currette.    CKD - Following with Dr. Eliott Nine.   Given new rx for shingles vaccine.

## 2012-05-19 ENCOUNTER — Other Ambulatory Visit: Payer: Self-pay | Admitting: *Deleted

## 2012-05-27 ENCOUNTER — Other Ambulatory Visit: Payer: Self-pay | Admitting: Dermatology

## 2012-05-28 LAB — COMPLETE METABOLIC PANEL WITH GFR
ALT: 14 U/L (ref 0–53)
Albumin: 3.9 g/dL (ref 3.5–5.2)
Alkaline Phosphatase: 95 U/L (ref 39–117)
CO2: 25 mEq/L (ref 19–32)
Calcium: 9.4 mg/dL (ref 8.4–10.5)
Chloride: 106 mEq/L (ref 96–112)
GFR, Est African American: 36 mL/min — ABNORMAL LOW
Sodium: 138 mEq/L (ref 135–145)
Total Protein: 6.2 g/dL (ref 6.0–8.3)

## 2012-05-28 LAB — LIPID PANEL
LDL Cholesterol: 60 mg/dL (ref 0–99)
Total CHOL/HDL Ratio: 2.2 Ratio
Triglycerides: 60 mg/dL (ref <150)
VLDL: 12 mg/dL (ref 0–40)

## 2012-06-01 ENCOUNTER — Ambulatory Visit (INDEPENDENT_AMBULATORY_CARE_PROVIDER_SITE_OTHER): Payer: BC Managed Care – PPO

## 2012-06-01 DIAGNOSIS — M949 Disorder of cartilage, unspecified: Secondary | ICD-10-CM

## 2012-06-03 ENCOUNTER — Other Ambulatory Visit: Payer: Self-pay | Admitting: Family Medicine

## 2012-06-03 MED ORDER — ALENDRONATE SODIUM 70 MG PO TABS: 70.0000 mg | ORAL_TABLET | ORAL | Status: DC

## 2012-06-04 ENCOUNTER — Telehealth: Payer: Self-pay | Admitting: *Deleted

## 2012-06-11 ENCOUNTER — Telehealth: Payer: Self-pay

## 2012-06-11 DIAGNOSIS — R799 Abnormal finding of blood chemistry, unspecified: Secondary | ICD-10-CM

## 2012-06-11 NOTE — Telephone Encounter (Signed)
See note below. She is also going to call his GI and Nephrologist to ask about the medication.

## 2012-06-11 NOTE — Telephone Encounter (Signed)
Left message for patient's wife to call back with response from the Nephrologist.

## 2012-06-11 NOTE — Telephone Encounter (Signed)
Wife is concerned that the fosamax will be harmful to his kidneys and his stomach. She is aware she may not get a call back until Monday.

## 2012-06-11 NOTE — Telephone Encounter (Signed)
If his nephrologist says no then we can consider prolia since it can be used irregardless of kidney or liver function.

## 2012-06-16 LAB — BASIC METABOLIC PANEL WITH GFR
CO2: 23 mEq/L (ref 19–32)
Calcium: 9.9 mg/dL (ref 8.4–10.5)
Chloride: 104 mEq/L (ref 96–112)
Sodium: 135 mEq/L (ref 135–145)

## 2012-06-17 ENCOUNTER — Other Ambulatory Visit: Payer: Self-pay | Admitting: *Deleted

## 2012-06-17 MED ORDER — CALCITRIOL 0.25 MCG PO CAPS: 0.2500 ug | ORAL_CAPSULE | ORAL | Status: DC

## 2012-06-17 MED ORDER — LOVASTATIN 20 MG PO TABS: 20.0000 mg | ORAL_TABLET | Freq: Every day | ORAL | Status: DC

## 2012-06-25 ENCOUNTER — Ambulatory Visit (INDEPENDENT_AMBULATORY_CARE_PROVIDER_SITE_OTHER): Payer: BC Managed Care – PPO | Admitting: Family Medicine

## 2012-06-25 ENCOUNTER — Encounter: Payer: Self-pay | Admitting: Family Medicine

## 2012-06-25 VITALS — BP 125/61 | HR 69 | Wt 127.0 lb

## 2012-06-25 DIAGNOSIS — R296 Repeated falls: Secondary | ICD-10-CM

## 2012-06-25 DIAGNOSIS — Z9181 History of falling: Secondary | ICD-10-CM

## 2012-06-25 DIAGNOSIS — R2689 Other abnormalities of gait and mobility: Secondary | ICD-10-CM

## 2012-06-25 DIAGNOSIS — M810 Age-related osteoporosis without current pathological fracture: Secondary | ICD-10-CM

## 2012-06-25 DIAGNOSIS — R269 Unspecified abnormalities of gait and mobility: Secondary | ICD-10-CM

## 2012-06-25 NOTE — Progress Notes (Addendum)
  Subjective:    Patient ID: Stanley Meyers, male    DOB: 1925-04-08, 79 y.o.   MRN: [id number]  HPI Here to discuss bone density results and treatment options.  His T score was -3.5. His wife is concerned about going on bisphosphonate. Had originally suggested this. They did speak with her nephrologist as it would be okay for him to start this. But his wife is concerned because of the counter medication with renal issues as well as the increased risk of osteonecrosis of the jaw. Here he has difficulty with dental problems. And has been told that they cannot do certain types of to dental work on him. Has been falling more frequently as well. He denies any lightheadedness or dizziness but says he just loses his balance from time to time. He denies falling to one particular side. He denies any ear pain or pressure. No prior history of vertigo.   Review of Systems     Objective:   Physical Exam  Constitutional: He is oriented to person, place, and time. He appears well-developed and well-nourished.  HENT:  Head: Normocephalic and atraumatic.  Right Ear: External ear normal.  Left Ear: External ear normal.       TMs are clear bilaterally.   Cardiovascular: Normal rate, regular rhythm and normal heart sounds.        Thrill over right radial artery  Pulmonary/Chest: Effort normal and breath sounds normal.  Neurological: He is alert and oriented to person, place, and time.  Skin: Skin is warm and dry.  Psychiatric: He has a normal mood and affect. His behavior is normal.          Assessment & Plan:  Osteoporosis-reviewed his DEXA scan results with him. Copy provided to patient.  Instead of the bisphosphonate because of concerns of osteoporosis in the jaw and his kidney function could certainly consider an agent called Prolia.A  Handout was printe off of up to date so that they could go home and read about it. They also have a followup appointment with his nephrologist, Dr. Shea Evans in about 2  weeks. If they can certainly discuss it with her as well and see if she feels okay with this particular medication.  This may be a good option for him.  Imbalance-unclear etiology. His ear exam is normal today. It sounds like she's not actually experiencing dizziness or vertigo. This may be a gait issue. I would like to refer him to rehabilitation for a fall assessment and see him back further recommendations. I did also do a more thorough exam on him at that time.  Frequent falls-he certainly is at increased risk of a fracture. I will get him in for a fall assessment and I can make further recommendations for him.  I am happy his blood pressure looks a little better today on the decreased dose of Lasix. In fact he may be to stop it completely. He is only taking it twice per week at this point time.  Time spent 25 min, > 50% spent in counselig for his falls and osteoporosis.

## 2012-07-07 ENCOUNTER — Encounter: Payer: Self-pay | Admitting: *Deleted

## 2012-07-11 ENCOUNTER — Emergency Department (HOSPITAL_BASED_OUTPATIENT_CLINIC_OR_DEPARTMENT_OTHER)
Admission: EM | Admit: 2012-07-11 | Discharge: 2012-07-11 | Disposition: A | Discharge status: Home / Self Care | Payer: MEDICARE | Attending: Emergency Medicine | Admitting: Emergency Medicine

## 2012-07-11 ENCOUNTER — Emergency Department
Admission: EM | Admit: 2012-07-11 | Discharge: 2012-07-17 | Disposition: E | Payer: BC Managed Care – PPO | Source: Home / Self Care | Attending: Family Medicine | Admitting: Family Medicine

## 2012-07-11 ENCOUNTER — Encounter (HOSPITAL_BASED_OUTPATIENT_CLINIC_OR_DEPARTMENT_OTHER): Payer: Self-pay | Admitting: *Deleted

## 2012-07-11 DIAGNOSIS — I129 Hypertensive chronic kidney disease with stage 1 through stage 4 chronic kidney disease, or unspecified chronic kidney disease: Secondary | ICD-10-CM | POA: Insufficient documentation

## 2012-07-11 DIAGNOSIS — M7020 Olecranon bursitis, unspecified elbow: Secondary | ICD-10-CM | POA: Insufficient documentation

## 2012-07-11 DIAGNOSIS — N19 Unspecified kidney failure: Secondary | ICD-10-CM | POA: Insufficient documentation

## 2012-07-11 DIAGNOSIS — E785 Hyperlipidemia, unspecified: Secondary | ICD-10-CM | POA: Insufficient documentation

## 2012-07-11 DIAGNOSIS — Z87891 Personal history of nicotine dependence: Secondary | ICD-10-CM | POA: Insufficient documentation

## 2012-07-11 DIAGNOSIS — M719 Bursopathy, unspecified: Secondary | ICD-10-CM

## 2012-07-11 DIAGNOSIS — K219 Gastro-esophageal reflux disease without esophagitis: Secondary | ICD-10-CM | POA: Insufficient documentation

## 2012-07-11 DIAGNOSIS — Z79899 Other long term (current) drug therapy: Secondary | ICD-10-CM | POA: Insufficient documentation

## 2012-07-11 MED ORDER — SULFAMETHOXAZOLE-TRIMETHOPRIM 800-160 MG PO TABS: 1.0000 | ORAL_TABLET | Freq: Two times a day (BID) | ORAL | Status: DC

## 2012-07-11 MED ORDER — LIDOCAINE HCL 2 % IJ SOLN
5.0000 mL | Freq: Once | INTRAMUSCULAR | Status: DC
  Filled 2012-07-11: qty 20

## 2012-07-11 NOTE — ED Notes (Signed)
Pt has a large raised reddened are on his right elbow that he noticed yesterday. Appears to be an abscess. Also has a fistula in the right arm.

## 2012-07-11 NOTE — ED Provider Notes (Signed)
History   This chart was scribed for Stanley Bucco, MD by Donne Anon, ED Scribe. This patient was seen in room MH02/MH02 and the patient's care was started at 1605.   CSN: [id number]  Arrival date & time 07/09/2012  1510   First MD Initiated Contact with Patient 07/06/2012 1605      Chief Complaint  Patient presents with  . Elbow Pain     The history is provided by the patient. No language interpreter was used.   Stanley Meyers is a 79 y.o. male who presents to the Emergency Department complaining of gradual onset, non changing, constant raised red area to his right elbow that began yesterday. He has a fistula in his right arm which was placed in 5 years ago. He states he does not currently receive dialysis because his kidney function stabilized (his last check was 1.81 2 weeks ago). He reports pain in his elbow joint. He denies fever or any other pain. He is UTD on his tetanus shot.  PCP is Dr. Eppie Gibson.  Past Medical History  Diagnosis Date  . Kidney failure   . GERD (gastroesophageal reflux disease)   . Hypertension   . Hyperlipemia     Past Surgical History  Procedure Date  . Cholecystectomy   . Hernia repair   . Av fistula placement     History reviewed. No pertinent family history.  History  Substance Use Topics  . Smoking status: Former Games developer  . Smokeless tobacco: Not on file  . Alcohol Use: 1.2 oz/week    2 Glasses of wine per week      Review of Systems  Constitutional: Negative for fever, chills, diaphoresis and fatigue.  HENT: Negative for congestion, rhinorrhea and sneezing.   Eyes: Negative.   Respiratory: Negative for cough, chest tightness and shortness of breath.   Cardiovascular: Negative for chest pain and leg swelling.  Gastrointestinal: Negative for nausea, vomiting, abdominal pain, diarrhea and blood in stool.  Genitourinary: Negative for frequency, hematuria, flank pain and difficulty urinating.  Musculoskeletal: Negative for back pain and  arthralgias.  Skin: Negative for rash.  Neurological: Negative for dizziness, speech difficulty, weakness, numbness and headaches.    Allergies  Review of patient's allergies indicates no known allergies.  Home Medications   Current Outpatient Rx  Name  Route  Sig  Dispense  Refill  . ACETAMINOPHEN 500 MG PO TABS   Oral   Take 1,000 mg by mouth 2 (two) times daily.         . ALENDRONATE SODIUM 70 MG PO TABS   Oral   Take 1 tablet (70 mg total) by mouth every 7 (seven) days. Take with a full glass of water on an empty stomach.   12 tablet   3   . AMBULATORY NON FORMULARY MEDICATION      Medication Name: Zostavax one time injection.   1 vial   0   . AMBULATORY NON FORMULARY MEDICATION      Medication Name: Shingles vaccine IM x 1   1 vial   0   . AMLODIPINE BESYLATE 5 MG PO TABS   Oral   Take 1 tablet (5 mg total) by mouth daily.   90 tablet   2   . CALCITRIOL 0.25 MCG PO CAPS   Oral   Take 1 capsule (0.25 mcg total) by mouth 2 (two) times a week. Take one tablet by mouth 2 times weekly   30 capsule   1   .  DICYCLOMINE HCL 20 MG PO TABS   Oral   Take 20 mg by mouth every 6 (six) hours.         . FUROSEMIDE 20 MG PO TABS   Oral   Take 1 tablet (20 mg total) by mouth daily.   90 tablet   2   . GUAIFENESIN ER 600 MG PO TB12   Oral   Take 600 mg by mouth daily.         Marland Kitchen LOVASTATIN 20 MG PO TABS   Oral   Take 1 tablet (20 mg total) by mouth at bedtime.   90 tablet   2   . MULTIVITAMIN PO   Oral   Take by mouth.         . OMEPRAZOLE 40 MG PO CPDR   Oral   Take 1 capsule (40 mg total) by mouth daily.   90 capsule   1   . SULFAMETHOXAZOLE-TRIMETHOPRIM 800-160 MG PO TABS   Oral   Take 1 tablet by mouth every 12 (twelve) hours.   20 tablet   0     Triage Vitals; BP 131/57  Pulse 72  Temp 97.6 F (36.4 C) (Oral)  Resp 20  Ht 5\' 7"  (1.702 m)  Wt 129 lb (58.514 kg)  BMI 20.20 kg/m2  SpO2 100%  Physical Exam  Constitutional: He  is oriented to person, place, and time. He appears well-developed and well-nourished.  HENT:  Head: Normocephalic and atraumatic.  Neck: Normal range of motion. Neck supple.  Abdominal: Soft. Bowel sounds are normal. There is no tenderness. There is no rebound and no guarding.  Musculoskeletal: He exhibits edema and tenderness.  Neurological: He is alert and oriented to person, place, and time.  Skin:       2 cm erythematous, fluctuant abscess over right olecranon with small pustule at center of fluctuance..  Negative for surrounding cellulitis. No pain on palpitation or movement of elbow joint. AV fistula in right forearm. Neurovascular in tact.    ED Course  INCISION AND DRAINAGE Date/Time: 07/12/2012 4:51 PM Performed by: Valerio Pinard Authorized by: Stanley Meyers Consent: Verbal consent obtained. Risks and benefits: risks, benefits and alternatives were discussed Consent given by: patient Type: abscess Body area: upper extremity Location details: right elbow Anesthesia: local infiltration Local anesthetic: lidocaine 1% with epinephrine (1) Anesthetic total: 1 ml Patient sedated: no Scalpel size: 11 Incision type: single straight Drainage characteristics: had small purulent drainage initially, then clear fluid. Drainage amount: moderate Wound treatment: wound left open Patient tolerance: Patient tolerated the procedure well with no immediate complications.   (including critical care time) DIAGNOSTIC STUDIES: Oxygen Saturation is 100% on room air, normal by my interpretation.    COORDINATION OF CARE: 4:08 PM Discussed treatment plan which includes draining the abscess with pt at bedside and pt agreed to plan.     Labs Reviewed - No data to display No results found.   1. Bursitis       MDM  Area appeared initially to be an abscess as it had a pustule overlying area, although when I went to I&D area, was likely and inflammed bursitis.  No evidence of joint  involvement. Will start on abx, TDAP UTD, bring back in 2 days for a recheck. I personally performed the services described in this documentation, which was scribed in my presence.  The recorded information has been reviewed and considered.         Stanley Bucco, MD Jul 12, 2012 [phone]

## 2012-07-13 ENCOUNTER — Encounter: Payer: Self-pay | Admitting: *Deleted

## 2012-07-13 ENCOUNTER — Emergency Department
Admission: EM | Admit: 2012-07-13 | Discharge: 2012-07-13 | Disposition: A | Discharge status: Home / Self Care | Payer: BC Managed Care – PPO | Source: Home / Self Care | Attending: Family Medicine | Admitting: Family Medicine

## 2012-07-13 DIAGNOSIS — M7021 Olecranon bursitis, right elbow: Secondary | ICD-10-CM

## 2012-07-13 DIAGNOSIS — M7020 Olecranon bursitis, unspecified elbow: Secondary | ICD-10-CM

## 2012-07-13 DIAGNOSIS — T148XXA Other injury of unspecified body region, initial encounter: Secondary | ICD-10-CM

## 2012-07-13 NOTE — ED Provider Notes (Signed)
History     CSN: [id number]  Arrival date & time 07/13/12  1204   First MD Initiated Contact with Patient 07/13/12 1328      Chief Complaint  Patient presents with  . Wound Check      HPI Comments: The patient presented to ER two days ago for a tender swollen right olecranon bursa.  I and D was attempted but minimal drainage was present.  Patient was started on Septra DS.  Patient and wife report that there has been a significant decrease in redness over the past two days, although he has not had much pain even before treatment.  No fevers, chills, and sweats   Patient is a 79 y.o. male presenting with arm injury. The history is provided by the patient and the spouse.  Arm Injury  The incident occurred more than 2 days ago. The incident occurred at home. The injury mechanism is unknown. There is an injury to the right elbow. The patient is experiencing no pain. It is unlikely that a foreign body is present. Pertinent negatives include no nausea and no vomiting. There have been no prior injuries to these areas. He is right-handed. His tetanus status is UTD.    Past Medical History  Diagnosis Date  . Kidney failure   . GERD (gastroesophageal reflux disease)   . Hypertension   . Hyperlipemia     Past Surgical History  Procedure Date  . Cholecystectomy   . Hernia repair   . Av fistula placement     History reviewed. No pertinent family history.  History  Substance Use Topics  . Smoking status: Former Games developer  . Smokeless tobacco: Not on file  . Alcohol Use: 1.2 oz/week    2 Glasses of wine per week      Review of Systems  Gastrointestinal: Negative for nausea and vomiting.  All other systems reviewed and are negative.    Allergies  Review of patient's allergies indicates no known allergies.  Home Medications   Current Outpatient Rx  Name  Route  Sig  Dispense  Refill  . ACETAMINOPHEN 500 MG PO TABS   Oral   Take 1,000 mg by mouth 2 (two) times daily.           . ALENDRONATE SODIUM 70 MG PO TABS   Oral   Take 1 tablet (70 mg total) by mouth every 7 (seven) days. Take with a full glass of water on an empty stomach.   12 tablet   3   . AMBULATORY NON FORMULARY MEDICATION      Medication Name: Zostavax one time injection.   1 vial   0   . AMBULATORY NON FORMULARY MEDICATION      Medication Name: Shingles vaccine IM x 1   1 vial   0   . AMLODIPINE BESYLATE 5 MG PO TABS   Oral   Take 1 tablet (5 mg total) by mouth daily.   90 tablet   2   . CALCITRIOL 0.25 MCG PO CAPS   Oral   Take 1 capsule (0.25 mcg total) by mouth 2 (two) times a week. Take one tablet by mouth 2 times weekly   30 capsule   1   . DICYCLOMINE HCL 20 MG PO TABS   Oral   Take 20 mg by mouth every 6 (six) hours.         . FUROSEMIDE 20 MG PO TABS   Oral   Take 1 tablet (20 mg total)  by mouth daily.   90 tablet   2   . GUAIFENESIN ER 600 MG PO TB12   Oral   Take 600 mg by mouth daily.         Marland Kitchen LOVASTATIN 20 MG PO TABS   Oral   Take 1 tablet (20 mg total) by mouth at bedtime.   90 tablet   2   . MULTIVITAMIN PO   Oral   Take by mouth.         . OMEPRAZOLE 40 MG PO CPDR   Oral   Take 1 capsule (40 mg total) by mouth daily.   90 capsule   1   . SULFAMETHOXAZOLE-TRIMETHOPRIM 800-160 MG PO TABS   Oral   Take 1 tablet by mouth every 12 (twelve) hours.   20 tablet   0     BP 117/66  Pulse 69  Temp 98.1 F (36.7 C) (Oral)  Resp 18  Wt 130 lb (58.968 kg)  SpO2 100%  Physical Exam  Nursing note and vitals reviewed. Constitutional: He is oriented to person, place, and time. He appears well-developed and well-nourished. No distress.  Eyes: Conjunctivae normal are normal. Pupils are equal, round, and reactive to light.  Musculoskeletal: Normal range of motion. He exhibits no tenderness.       Right elbow: He exhibits swelling. He exhibits normal range of motion, no effusion, no deformity and no laceration. no tenderness found.        Arms:      Right olecranon bursa is enlarged and firm with a rubbery consistency but non-tender.  There is mild overlying erythema but no fluctuance.  There is evidence of a healing 5mm long incision site centrally over the bursa with a small amount of clear discharge.  Distal Neurovascular function is intact.   Neurological: He is alert and oriented to person, place, and time.  Skin: Skin is warm and dry.    ED Course  Procedures none   Labs Reviewed  WOUND CULTURE pending      1. Wound infection appears to be improving by history.  2. Olecranon bursitis of right elbow       MDM  Wound culture taken from previous I and D site. Change bandage daily and apply Neosporin ointment.  Begin warm soaks 2 or 3 times daily.  Finish antibiotics Followup Dr. Rodney Langton one week if not resolved.        Lattie Haw, MD 07/13/12 1534

## 2012-07-13 NOTE — ED Notes (Signed)
The pt is here today for a wound check of his RT elbow. He was seen in the ED on Sunday for I &D and placed on ABT. Denies fever.

## 2012-07-16 ENCOUNTER — Telehealth: Payer: Self-pay | Admitting: *Deleted

## 2012-07-17 ENCOUNTER — Telehealth: Payer: Self-pay

## 2012-07-17 LAB — WOUND CULTURE: Gram Stain: NONE SEEN

## 2012-07-17 NOTE — ED Notes (Signed)
Left a message on voice mail asking how patient is feeling and advising to call back with any questions or concerns. Also to finish all antibiotics.

## 2012-07-17 DEATH — deceased

## 2012-07-28 ENCOUNTER — Encounter: Payer: Self-pay | Admitting: Family Medicine

## 2012-07-28 ENCOUNTER — Ambulatory Visit (INDEPENDENT_AMBULATORY_CARE_PROVIDER_SITE_OTHER): Payer: MEDICARE | Admitting: Sports Medicine

## 2012-07-28 ENCOUNTER — Ambulatory Visit (INDEPENDENT_AMBULATORY_CARE_PROVIDER_SITE_OTHER): Payer: MEDICARE

## 2012-07-28 VITALS — BP 109/59 | HR 64 | Ht 67.0 in | Wt 125.0 lb

## 2012-07-28 DIAGNOSIS — M7021 Olecranon bursitis, right elbow: Secondary | ICD-10-CM | POA: Insufficient documentation

## 2012-07-28 DIAGNOSIS — M7020 Olecranon bursitis, unspecified elbow: Secondary | ICD-10-CM

## 2012-07-28 DIAGNOSIS — M19049 Primary osteoarthritis, unspecified hand: Secondary | ICD-10-CM

## 2012-07-28 IMAGING — CR DG HAND COMPLETE 3+V*R*
3 series · 3 of 3 positions shown · non-contrast
Comparison: None.

CLINICAL DATA: Evaluate osteoarthritic changes.

RIGHT HAND - COMPLETE 3+ VIEW

[view not recorded (1 of 3)]
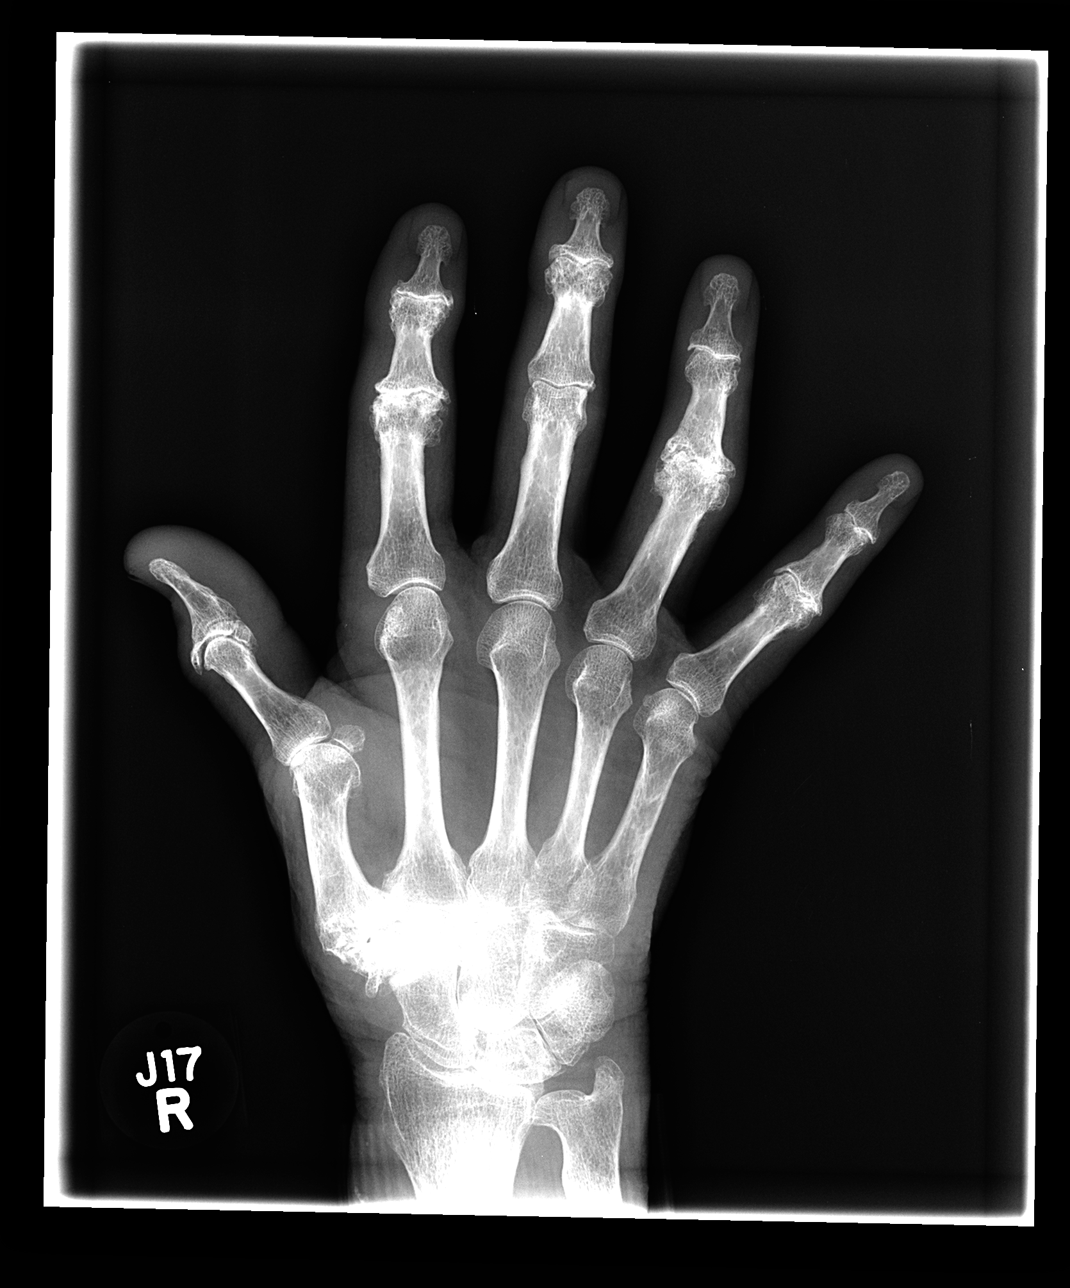

[view not recorded (2 of 3)]
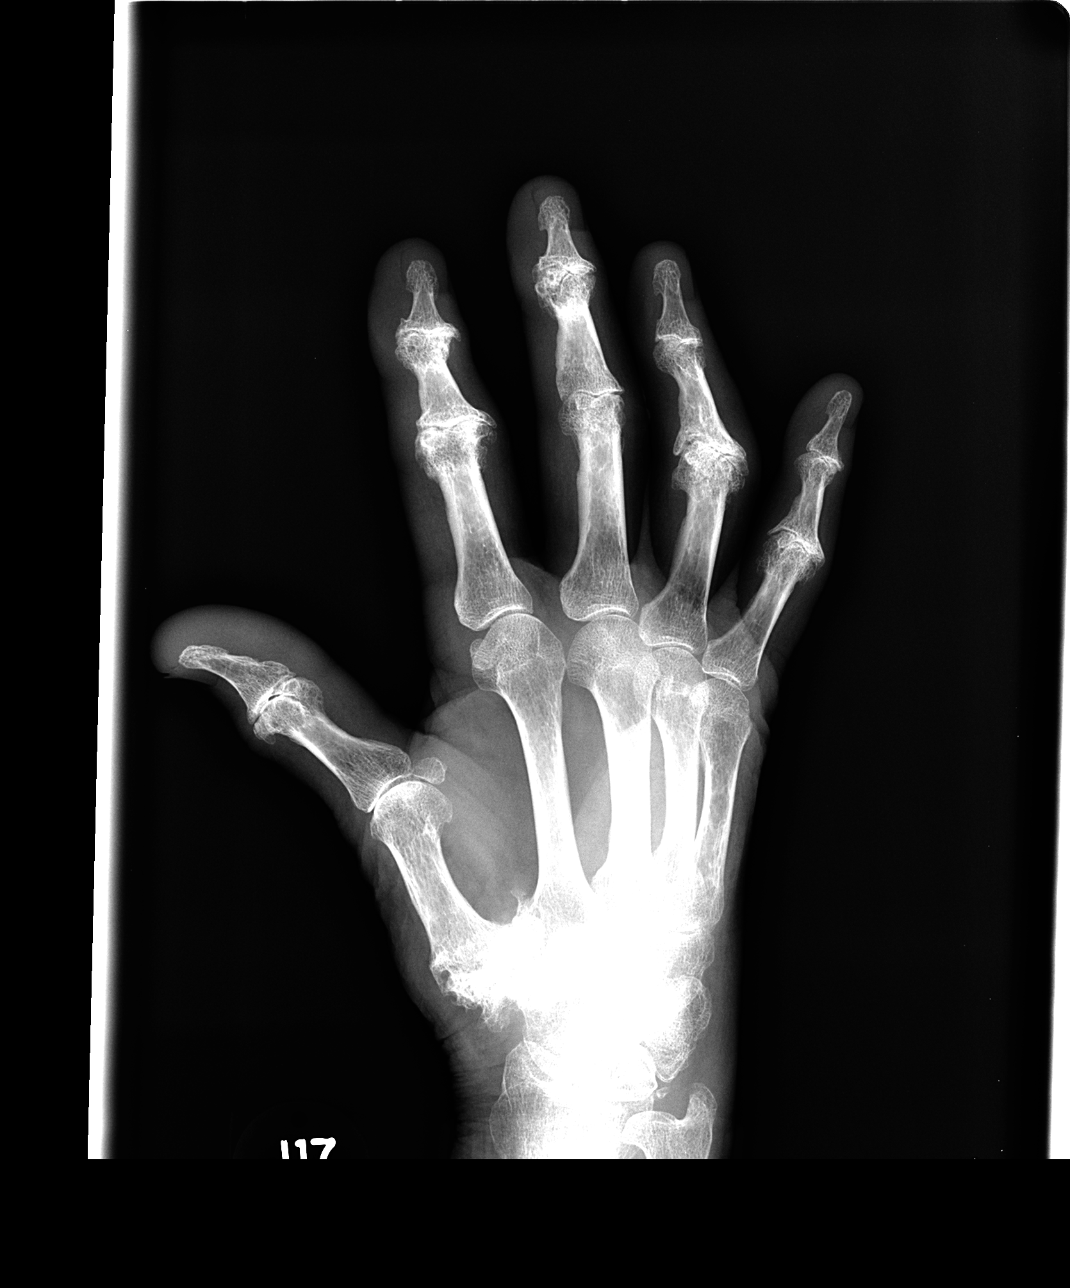

[view not recorded (3 of 3)]
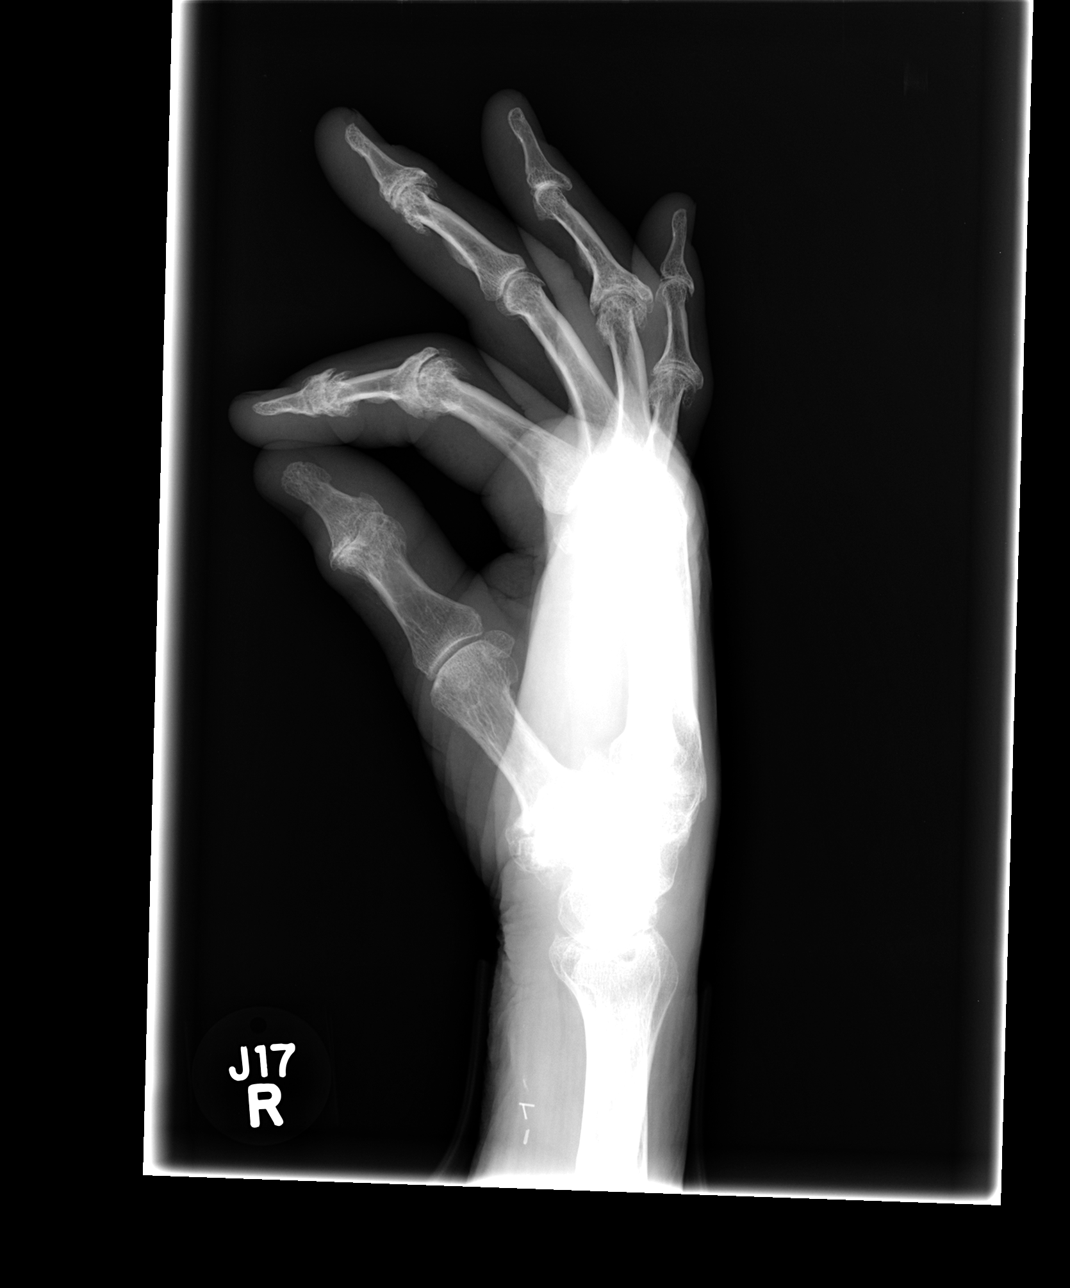

[3 of 3 positions shown; findings below may reference images not displayed]

FINDINGS: Multifocal degenerative changes.  Moderate to severe in
the proximal interphalangeal joints of the second, fourth, and
fifth digits.  Moderate in the distal interphalangeal joints of the
second through fifth digits.  Moderate to severe at the base of the
thumb.  Narrowing of the joint spaces of the carpus.  No erosions.
Ulnar styloid intact.  Bony mineralization normal for age. No acute
fracture or dislocation.
IMPRESSION: Moderate to severe osteoarthritis.

## 2012-07-28 MED ORDER — TRAMADOL HCL 50 MG PO TABS: 50.0000 mg | ORAL_TABLET | Freq: Three times a day (TID) | ORAL | Status: DC | PRN

## 2012-07-28 NOTE — Assessment & Plan Note (Signed)
Aspirated almost 2 cc of fluid. It was not cloudy. Cultures from the emergency department showed Staphylococcus aureus sensitive to Septra which he is on. I'm going to add tramadol for his pain.

## 2012-07-28 NOTE — Assessment & Plan Note (Signed)
I would like to x-ray his hands. He cannot have NSAIDs due to renal failure. We can do intra-articular injections to relieve his pain, he will pick the 4 most painful interphalangeal joints and we can inject them at the next visit.

## 2012-07-28 NOTE — Progress Notes (Signed)
  Subjective:    I'm seeing this patient as a consultation for:  Dr. Linford Arnold  CC: Right olecranon bursitis  HPI: This is a very pleasant 79 year old male with end stage chronic renal insufficiency and a history of right olecranon bursitis, he was seen in the emergency department 2 weeks ago were incision and drainage without packing or drain was performed. Cultures grew out Staphylococcus aureus sensitive to Septra. He was placed on Septra for 10 days, and returns for followup. Overall pain is much better, swelling is much better but still present and this concerns him. He denies any constitutional symptoms such as fevers, chills, rash. He is having some pain but with his renal failure he is unable to take NSAIDs, and he has only been able to use Tylenol. Pain is localized, doesn't radiate.  Hand pain: Present bilaterally with end stage DJD in the interphalangeal joints. He used to take Vioxx but with his current renal failure, is unable to take NSAIDs. Pain is exquisite, swelling is present, and his symptoms are localized.  Past medical history, Surgical history, Family history not pertinant except as noted below, Social history, Allergies, and medications have been entered into the medical record, reviewed, and no changes needed.   Review of Systems: No headache, visual changes, nausea, vomiting, diarrhea, constipation, dizziness, abdominal pain, skin rash, fevers, chills, night sweats, weight loss, swollen lymph nodes, body aches, joint swelling, muscle aches, chest pain, shortness of breath, mood changes, visual or auditory hallucinations.   Objective:   General: Well Developed, well nourished, and in no acute distress.  Neuro/Psych: Alert and oriented x3, extra-ocular muscles intact, able to move all 4 extremities, sensation grossly intact. Skin: Warm and dry, no rashes noted.  Respiratory: Not using accessory muscles, speaking in full sentences, trachea midline.  Cardiovascular: Pulses  palpable, no extremity edema. Abdomen: Does not appear distended. Hands: There are Bouchard and Heberden nodes on all fingers. Range of motion is very limited. There is also fusiform swelling of the proximal and distal interphalangeal joints. Metacarpophalangeal joints appear somewhat spared. Right elbow: There is swelling of the olecranon bursa, there is very minimal fluctuance, and it feels to be organizing. There is no longer any drainage at the incision and drainage site. There is mild tenderness to palpation and only minimal erythema.  Procedure: Real-time Ultrasound Guided aspiration of right olecranon bursa.  Device: GE Logiq E  Ultrasound guided injection is preferred based studies that show increased duration, increased effect, greater accuracy, decreased procedural pain, increased response rate, and decreased cost with ultrasound guided versus blind injection.  Verbal informed consent obtained.  Time-out conducted.  Noted no overlying erythema, induration, or other signs of local infection.  Skin prepped in a sterile fashion.  Local anesthesia: Topical Ethyl chloride.  With sterile technique and under real time ultrasound guidance:  Ultrasound probe placed, several fluid pockets were seen. A 21-gauge 1 inch needle was advanced under real-time guided individually into each of the fluid pockets, and these were aspirated. Fluid was not cloudy.   Completed without difficulty  Pain immediately resolved suggesting accurate placement of the medication.  Advised to call if fevers/chills, erythema, induration, drainage, or persistent bleeding.  Images permanently stored and available for review in the ultrasound unit.  Impression: Technically successful ultrasound guided aspiration.  Impression and Recommendations:   This case required medical decision making of moderate complexity.

## 2012-08-02 ENCOUNTER — Other Ambulatory Visit: Payer: Self-pay

## 2012-08-02 MED ORDER — OMEPRAZOLE 40 MG PO CPDR: 40.0000 mg | DELAYED_RELEASE_CAPSULE | Freq: Every day | ORAL | Status: DC

## 2012-08-23 ENCOUNTER — Ambulatory Visit (INDEPENDENT_AMBULATORY_CARE_PROVIDER_SITE_OTHER): Payer: MEDICARE | Admitting: Sports Medicine

## 2012-08-23 DIAGNOSIS — M7020 Olecranon bursitis, unspecified elbow: Secondary | ICD-10-CM

## 2012-08-23 DIAGNOSIS — M7021 Olecranon bursitis, right elbow: Secondary | ICD-10-CM

## 2012-08-23 DIAGNOSIS — M19049 Primary osteoarthritis, unspecified hand: Secondary | ICD-10-CM

## 2012-08-23 MED ORDER — DOXYCYCLINE HYCLATE 100 MG PO TABS: 100.0000 mg | ORAL_TABLET | Freq: Two times a day (BID) | ORAL | Status: AC

## 2012-08-23 NOTE — Progress Notes (Addendum)
  Subjective:    CC: Follow up  HPI: Septic right olecranon bursitis: Status post 10 days of Septra, cultures in the past have yielded pansensitive staph aureus. Currently improved since the last visit, but still somewhat painful, red, and swollen.  Hand pain: Has severe interphalangeal DJD. He is unable to take NSAIDs due to renal insufficiency, Tylenol is ineffective, and tramadol caused mild altered mental status and drowsiness. He does desire interventional treatment.  Past medical history, Surgical history, Family history not pertinant except as noted below, Social history, Allergies, and medications have been entered into the medical record, reviewed, and no changes needed.   Review of Systems: No headache, visual changes, nausea, vomiting, diarrhea, constipation, dizziness, abdominal pain, skin rash, fevers, chills, night sweats, weight loss, swollen lymph nodes, body aches, joint swelling, muscle aches, chest pain, shortness of breath, mood changes, visual or auditory hallucinations.   Objective:   General: Well Developed, well nourished, and in no acute distress.  Neuro/Psych: Alert and oriented x3, extra-ocular muscles intact, able to move all 4 extremities, sensation grossly intact. Skin: Warm and dry, no rashes noted.  Respiratory: Not using accessory muscles, speaking in full sentences, trachea midline.  Cardiovascular: Pulses palpable, no extremity edema. Abdomen: Does not appear distended.  Procedure:  Complicated incision and drainage of infected right olecranon bursa Risks, benefits, and alternatives explained and consent obtained. Time out conducted. Surface cleaned with alcohol. 10cc lidocaine with epinephine infiltrated around olecranon bursa. Adequate anesthesia ensured. Area prepped and draped in a sterile fashion. #11 blade used to make a stab incision into abscess. Curved hemostat used to explore 4 quadrants and loculations broken up. Dissection carried down to  the deep subcutaneous tissues and olecranon bursa was exposed. A small amount of seropurulent discharge was seen. Iodoform packing placed leaving a 1-inch tail. Hemostasis achieved. Pt stable. Aftercare and follow-up advised.  Procedure: Real-time Ultrasound Guided Injection of right second proximal interphalangeal joint. Device: GE Logiq E  Ultrasound guided injection is preferred based studies that show increased duration, increased effect, greater accuracy, decreased procedural pain, increased response rate, and decreased cost with ultrasound guided versus blind injection.  Verbal informed consent obtained.  Time-out conducted.  Noted no overlying erythema, induration, or other signs of local infection.  Skin prepped in a sterile fashion.  Local anesthesia: Topical Ethyl chloride.  With sterile technique and under real time ultrasound guidance:  25-gauge needle advanced into joint capsule, 0.5 cc Kenalog 40, 0.5 cc lidocaine injected easily. Completed without difficulty  Pain immediately resolved suggesting accurate placement of the medication.  Advised to call if fevers/chills, erythema, induration, drainage, or persistent bleeding.  Images permanently stored and available for review in the ultrasound unit.  Impression: Technically successful ultrasound guided injection. Impression and Recommendations:   This case required medical decision making of moderate complexity.

## 2012-08-23 NOTE — Assessment & Plan Note (Signed)
Injected the right second proximal interphalangeal joint. He doesn't have a lot of pain, his main concern is mobility. I am going to get him into physical therapy to work on strength and range of motion of each of his nonfused digits. I would like to see him back in one month for this.

## 2012-08-23 NOTE — Assessment & Plan Note (Signed)
Incision and drainage of olecranon bursa performed in the office today. I think another 2-3 weeks of antibiotics are needed. I will see him back at the end of the week to recheck the wound.

## 2012-08-27 ENCOUNTER — Ambulatory Visit (INDEPENDENT_AMBULATORY_CARE_PROVIDER_SITE_OTHER): Payer: MEDICARE | Admitting: Sports Medicine

## 2012-08-27 ENCOUNTER — Encounter: Payer: Self-pay | Admitting: Sports Medicine

## 2012-08-27 VITALS — BP 113/61 | HR 61

## 2012-08-27 DIAGNOSIS — I7300 Raynaud's syndrome without gangrene: Secondary | ICD-10-CM

## 2012-08-27 DIAGNOSIS — M7021 Olecranon bursitis, right elbow: Secondary | ICD-10-CM

## 2012-08-27 NOTE — Assessment & Plan Note (Addendum)
Injection was equivocal benefit. He does have some hand therapy coming up. Since our physical therapists do not take Medicare, I am sending him to [facility] physical therapy here in [place].

## 2012-08-27 NOTE — Progress Notes (Signed)
  Subjective:    CC: Followup  HPI: Right olecranon bursitis: I saw Stanley Meyers last week, I performed an incision and drainage with packing of an infected olecranon bursa with a culture that grew out pansensitive Staphylococcus aureus. He returns today with pain and swelling improving, his wife pulled out some of the packing. He is current still taking antibiotics.  Hand osteoarthritis: Not a lot of improvement after the distal interphalangeal joint injection.  Raynaud disease: Is currently already on amlodipine, wondering if there's anything else can be done. He wears gloves all the time even inside.  Past medical history, Surgical history, Family history not pertinant except as noted below, Social history, Allergies, and medications have been entered into the medical record, reviewed, and no changes needed.   Review of Systems: No headache, visual changes, nausea, vomiting, diarrhea, constipation, dizziness, abdominal pain, skin rash, fevers, chills, night sweats, weight loss, swollen lymph nodes, body aches, joint swelling, muscle aches, chest pain, shortness of breath, mood changes, visual or auditory hallucinations.   Objective:   General: Well Developed, well nourished, and in no acute distress.  Neuro/Psych: Alert and oriented x3, extra-ocular muscles intact, able to move all 4 extremities, sensation grossly intact. Skin: Warm and dry, no rashes noted.  Respiratory: Not using accessory muscles, speaking in full sentences, trachea midline.  Cardiovascular: Pulses palpable, no extremity edema. Abdomen: Does not appear distended. Right olecranon bursa: Packing was removed, erythema continues to resolve, there is no further purulence, and it is nontender.  Impression and Recommendations:   This case required medical decision making of moderate complexity.

## 2012-08-27 NOTE — Assessment & Plan Note (Signed)
Topical cream sample given.

## 2012-08-27 NOTE — Assessment & Plan Note (Signed)
Now status post incision and drainage of the olecranon bursa with packing. I removed the packing, reapplied dressing, and strapped the elbow. He will continue antibiotics and see me back in 2 weeks.

## 2012-09-10 ENCOUNTER — Encounter: Payer: Self-pay | Admitting: Sports Medicine

## 2012-09-10 ENCOUNTER — Ambulatory Visit (INDEPENDENT_AMBULATORY_CARE_PROVIDER_SITE_OTHER): Payer: MEDICARE | Admitting: Sports Medicine

## 2012-09-10 VITALS — BP 108/53 | HR 76 | Wt 122.0 lb

## 2012-09-10 DIAGNOSIS — M7020 Olecranon bursitis, unspecified elbow: Secondary | ICD-10-CM

## 2012-09-10 DIAGNOSIS — M7021 Olecranon bursitis, right elbow: Secondary | ICD-10-CM

## 2012-09-10 DIAGNOSIS — M19049 Primary osteoarthritis, unspecified hand: Secondary | ICD-10-CM

## 2012-09-10 NOTE — Patient Instructions (Addendum)
Please see your primary care physician about possibly starting a medicine that will increase your appetite. Slowly increase your exercise, continue going to the gym with your wife and then try to start walking 30 minutes 3 times a week.

## 2012-09-10 NOTE — Assessment & Plan Note (Signed)
Improving with aggressive formal physical therapy. They're working extensively on his strength and range of motion.

## 2012-09-10 NOTE — Assessment & Plan Note (Signed)
Resolved status post incision and drainage.

## 2012-09-10 NOTE — Progress Notes (Signed)
   Subjective:    CC: Followup  HPI: Right olecranon bursitis: Status post incision and drainage, nearly 100% improved. Still has 2 days left of antibiotics.  Bilateral hand osteoarthritis: has had years of disuse with atrophy in the forearms. He is now working extensively on formal physical therapy and is already gaining strength and range of motion. His pain is somewhat less.     Exercise: Unfortunately has been losing weight, doesn't have much of an appetite. Per patient, did discuss orexigenic medications with PCP. He tries to go to the gym with his wife, they have stopped walking in the park as they used to be. He just doesn't have the energy, denies any knee, hip or foot or ankle pain.  Past medical history, Surgical history, Family history not pertinant except as noted below, Social history, Allergies, and medications have been entered into the medical record, reviewed, and no changes needed.   Review of Systems: No headache, visual changes, nausea, vomiting, diarrhea, constipation, dizziness, abdominal pain, skin rash, fevers, chills, night sweats, weight loss, swollen lymph nodes, body aches, joint swelling, muscle aches, chest pain, shortness of breath, mood changes, visual or auditory hallucinations.   Objective:   General: Well Developed, well nourished, and in no acute distress.  Neuro/Psych: Alert and oriented x3, extra-ocular muscles intact, able to move all 4 extremities, sensation grossly intact. Skin: Warm and dry, no rashes noted.  Respiratory: Not using accessory muscles, speaking in full sentences, trachea midline.  Cardiovascular: Pulses palpable, no extremity edema. Abdomen: Does not appear distended. Right elbow: Healed.  Impression and Recommendations:   This case required medical decision making of moderate complexity.

## 2012-09-14 ENCOUNTER — Ambulatory Visit (INDEPENDENT_AMBULATORY_CARE_PROVIDER_SITE_OTHER): Payer: MEDICARE | Admitting: Sports Medicine

## 2012-09-14 VITALS — BP 124/59 | HR 68 | Wt 124.0 lb

## 2012-09-14 DIAGNOSIS — M7021 Olecranon bursitis, right elbow: Secondary | ICD-10-CM

## 2012-09-14 DIAGNOSIS — M7020 Olecranon bursitis, unspecified elbow: Secondary | ICD-10-CM

## 2012-09-14 MED ORDER — SULFAMETHOXAZOLE-TRIMETHOPRIM 800-160 MG PO TABS: 1.0000 | ORAL_TABLET | Freq: Two times a day (BID) | ORAL | Status: AC

## 2012-09-14 NOTE — Progress Notes (Signed)
   Subjective:    CC: Olecranon bursitis  HPI: I saw Jamarri approximately a month and a half ago for septic right olecranon bursitis. He already had an aspiration performed which grew out Staphylococcus aureus, sensitive to Septra and doxy.  I performed a complicated incision and drainage with packing of the olecranon bursa. I placed him on 2 weeks of doxycycline. Olecranon bursa was doing much better until recently when yesterday he noted increasing redness, as well as a soft tissue structure protruding from the wound he denies any fevers or chills, no other constitutional symptoms and very little pain.   symptoms are localized, worsening.   Past medical history, Surgical history, Family history not pertinant except as noted below, Social history, Allergies, and medications have been entered into the medical record, reviewed, and no changes needed.   Review of Systems: No headache, visual changes, nausea, vomiting, diarrhea, constipation, dizziness, abdominal pain, skin rash, fevers, chills, night sweats, weight loss, swollen lymph nodes, body aches, joint swelling, muscle aches, chest pain, shortness of breath, mood changes, visual or auditory hallucinations.   Objective:   General: Well Developed, well nourished, and in no acute distress.  Neuro/Psych: Alert and oriented x3, extra-ocular muscles intact, able to move all 4 extremities, sensation grossly intact. Skin: Warm and dry, no rashes noted.  Respiratory: Not using accessory muscles, speaking in full sentences, trachea midline.  Cardiovascular: Pulses palpable, no extremity edema. Abdomen: Does not appear distended. Right olecranon bursa appears enlarged, mildly tender to palpation and mildly erythematous.  Procedure:  Complicated incision and drainage of infected right olecranon bursa Risks, benefits, and alternatives explained and consent obtained. Time out conducted. Surface cleaned with Betadine. Lesion was fairly nontender, and  incision was still patent after blunt dissection. A small amount of seropurulent discharge was seen. I irrigated the olecranon bursa with 60 cc of peroxide. Iodoform packing placed leaving a 1-inch tail, approximately 6 inches placed inside the olecranon bursa. Hemostasis achieved. Pt stable. Aftercare and follow-up advised.  Impression and Recommendations:   This case required medical decision making of moderate complexity.

## 2012-09-14 NOTE — Assessment & Plan Note (Signed)
I do think he is a mild recurrence of his olecranon bursitis. I reopened the olecranon bursa, irrigated with peroxide. I then repacked the bursa with approximately 6 inches of quarter-inch iodoform. We'll do an additional 2 weeks of Septra double strength. He will come back in one week for a nurse visit, approximately 3 inches of packing will be removed leaving a one inch tail. He will see me one week after that for full reassessment and likely full removal of the packing. He declines any pain medication today.

## 2012-09-17 ENCOUNTER — Encounter: Payer: Self-pay | Admitting: Family Medicine

## 2012-09-17 ENCOUNTER — Ambulatory Visit (INDEPENDENT_AMBULATORY_CARE_PROVIDER_SITE_OTHER): Payer: MEDICARE

## 2012-09-17 ENCOUNTER — Ambulatory Visit (INDEPENDENT_AMBULATORY_CARE_PROVIDER_SITE_OTHER): Payer: MEDICARE | Admitting: Family Medicine

## 2012-09-17 ENCOUNTER — Telehealth: Payer: Self-pay | Admitting: Family Medicine

## 2012-09-17 VITALS — BP 124/64 | HR 71 | Wt 125.0 lb

## 2012-09-17 DIAGNOSIS — N184 Chronic kidney disease, stage 4 (severe): Secondary | ICD-10-CM

## 2012-09-17 DIAGNOSIS — I69928 Other speech and language deficits following unspecified cerebrovascular disease: Secondary | ICD-10-CM

## 2012-09-17 DIAGNOSIS — R259 Unspecified abnormal involuntary movements: Secondary | ICD-10-CM

## 2012-09-17 DIAGNOSIS — R634 Abnormal weight loss: Secondary | ICD-10-CM

## 2012-09-17 DIAGNOSIS — R251 Tremor, unspecified: Secondary | ICD-10-CM

## 2012-09-17 DIAGNOSIS — R05 Cough: Secondary | ICD-10-CM

## 2012-09-17 DIAGNOSIS — Z0000 Encounter for general adult medical examination without abnormal findings: Secondary | ICD-10-CM

## 2012-09-17 DIAGNOSIS — R479 Unspecified speech disturbances: Secondary | ICD-10-CM

## 2012-09-17 NOTE — Progress Notes (Signed)
Called [facility] Meyers and spoke w/Becky and she will fax over the last eye exam for pt.[name]

## 2012-09-17 NOTE — Telephone Encounter (Signed)
Please call patient his wife and see if he would be interested in nutrition referral. This might be helpful the way for him to try to work on getting extra calories and but with following a kidney diet

## 2012-09-17 NOTE — Progress Notes (Signed)
Subjective:    Stanley Meyers is a 79 y.o. male who presents for Medicare Annual/Subsequent preventive examination.   Preventive Screening-Counseling & Management  Tobacco History  Smoking status  . Former Smoker  Smokeless tobacco  . Not on file    Problems Prior to Visit 1. Falling - Has been falling more when went to [facility] for a wedding 2 years ago. Since then has balance issues. If stands up too quickly he wobble. Does use a cane.  He has a shower chair. Has noticed a daily tremor in both hands that started about 2 months ago.  Hard to buttons shirts because of it.  2. Speech changes - difficulty articulating things. Having hard to commuicating words.  Not the incorrect word. Also more recent.  3. Coughing up lots of phelgm.   Current Problems (verified) Patient Active Problem List  Diagnosis  . COLON POLYP  . HYPERLIPIDEMIA  . ANEMIA, OTHER, UNSPECIFIED  . DEPRESSION, MAJOR, RECURRENT  . HYPERTENSION, BENIGN SYSTEMIC  . RAYNAUDS SYNDROME  . CHRONIC KIDNEY DISEASE STAGE IV (SEVERE)  . SECONDARY HYPERPARATHYROIDISM  . PROSTATIC HYPERTROPHY, BENIGN  . Osteoarthritis, hand  . ARTHROPATHY NOS, SHOULDER  . ARTHRITIS, CERVICAL SPINE  . RENAL OSTEODYSTROPHY  . SENILE OSTEOPOROSIS  . Olecranon bursitis of right elbow    Medications Prior to Visit Current Outpatient Prescriptions on File Prior to Visit  Medication Sig Dispense Refill  . acetaminophen (TYLENOL) 500 MG tablet Take 1,000 mg by mouth 2 (two) times daily.      Marland Kitchen alendronate (FOSAMAX) 70 MG tablet Take 1 tablet (70 mg total) by mouth every 7 (seven) days. Take with a full glass of water on an empty stomach.  12 tablet  3  . AMBULATORY NON FORMULARY MEDICATION Medication Name: Zostavax one time injection.  1 vial  0  . AMBULATORY NON FORMULARY MEDICATION Medication Name: Shingles vaccine IM x 1  1 vial  0  . amLODipine (NORVASC) 5 MG tablet Take 1 tablet (5 mg total) by mouth daily.  90 tablet  2  . calcitRIOL  (ROCALTROL) 0.25 MCG capsule Take 1 capsule (0.25 mcg total) by mouth 2 (two) times a week. Take one tablet by mouth 2 times weekly  30 capsule  1  . dicyclomine (BENTYL) 20 MG tablet Take 20 mg by mouth every 6 (six) hours.      . furosemide (LASIX) 20 MG tablet Take 1 tablet (20 mg total) by mouth daily.  90 tablet  2  . guaiFENesin (MUCINEX) 600 MG 12 hr tablet Take 600 mg by mouth daily.      Marland Kitchen lovastatin (MEVACOR) 20 MG tablet Take 1 tablet (20 mg total) by mouth at bedtime.  90 tablet  2  . Multiple Vitamins-Minerals (MULTIVITAMIN PO) Take by mouth.      Marland Kitchen omeprazole (PRILOSEC) 40 MG capsule Take 1 capsule (40 mg total) by mouth daily.  90 capsule  1  . sulfamethoxazole-trimethoprim (BACTRIM DS,SEPTRA DS) 800-160 MG per tablet Take 1 tablet by mouth 2 (two) times daily.  28 tablet  0   No current facility-administered medications on file prior to visit.    Current Medications (verified) Current Outpatient Prescriptions  Medication Sig Dispense Refill  . acetaminophen (TYLENOL) 500 MG tablet Take 1,000 mg by mouth 2 (two) times daily.      Marland Kitchen alendronate (FOSAMAX) 70 MG tablet Take 1 tablet (70 mg total) by mouth every 7 (seven) days. Take with a full glass of water on an  empty stomach.  12 tablet  3  . AMBULATORY NON FORMULARY MEDICATION Medication Name: Zostavax one time injection.  1 vial  0  . AMBULATORY NON FORMULARY MEDICATION Medication Name: Shingles vaccine IM x 1  1 vial  0  . amLODipine (NORVASC) 5 MG tablet Take 1 tablet (5 mg total) by mouth daily.  90 tablet  2  . calcitRIOL (ROCALTROL) 0.25 MCG capsule Take 1 capsule (0.25 mcg total) by mouth 2 (two) times a week. Take one tablet by mouth 2 times weekly  30 capsule  1  . dicyclomine (BENTYL) 20 MG tablet Take 20 mg by mouth every 6 (six) hours.      . furosemide (LASIX) 20 MG tablet Take 1 tablet (20 mg total) by mouth daily.  90 tablet  2  . guaiFENesin (MUCINEX) 600 MG 12 hr tablet Take 600 mg by mouth daily.      Marland Kitchen  lovastatin (MEVACOR) 20 MG tablet Take 1 tablet (20 mg total) by mouth at bedtime.  90 tablet  2  . Multiple Vitamins-Minerals (MULTIVITAMIN PO) Take by mouth.      Marland Kitchen omeprazole (PRILOSEC) 40 MG capsule Take 1 capsule (40 mg total) by mouth daily.  90 capsule  1  . sulfamethoxazole-trimethoprim (BACTRIM DS,SEPTRA DS) 800-160 MG per tablet Take 1 tablet by mouth 2 (two) times daily.  28 tablet  0   No current facility-administered medications for this visit.     Allergies (verified) Review of patient's allergies indicates no known allergies.   PAST HISTORY  Family History No family history on file.  Social History History  Substance Use Topics  . Smoking status: Former Games developer  . Smokeless tobacco: Not on file  . Alcohol Use: 1.2 oz/week    2 Glasses of wine per week    Are there smokers in your home (other than you)?  No  Risk Factors Current exercise habits: tries to stay active  Dietary issues discussed: Loss of appetite.    Cardiac risk factors: advanced age (older than 1 for men, 51 for women) and hypertension.  Depression Screen (Note: if answer to either of the following is "Yes", a more complete depression screening is indicated)   Q1: Over the past two weeks, have you felt down, depressed or hopeless? No  Q2: Over the past two weeks, have you felt little interest or pleasure in doing things? No  Have you lost interest or pleasure in daily life? No  Do you often feel hopeless? No  Do you cry easily over simple problems? No  Activities of Daily Living In your present state of health, do you have any difficulty performing the following activities?:  Driving? Yes Managing money?  Yes Feeding yourself? No Getting from bed to chair? Yes Climbing a flight of stairs? Yes Preparing food and eating?: No Bathing or showering? Yes Getting dressed: Yes Getting to the toilet? No Using the toilet:No Moving around from place to place: No In the past year have you fallen  or had a near fall?:Yes   Are you sexually active?  No  Do you have more than one partner?  No  Hearing Difficulties: Yes Do you often ask people to speak up or repeat themselves? Yes Do you experience ringing or noises in your ears? Yes Do you have difficulty understanding soft or whispered voices? Yes   Do you feel that you have a problem with memory? No  Do you often misplace items? No  Do you feel safe at  home?  No  Cognitive Testing  Alert? Yes  Normal Appearance?Yes  Oriented to person? Yes  Place? Yes   Time? Yes  Recall of three objects?  Yes  Can perform simple calculations? Yes  Displays appropriate judgment?Yes  Can read the correct time from a watch face?Yes   Advanced Directives have been discussed with the patient? No   List the Names of Other Physician/Practitioners you currently use: 1.  Dr. Camille Bal, nephrology 2.  Dr. Rodney Langton, sports medicine  Indicate any recent Medical Services you may have received from other than [facility] providers in the past year (date may be approximate).  Immunization History  Administered Date(s) Administered  . H1N1 04/27/2008  . Influenza Split 04/01/2012  . Influenza Whole 03/30/2006, 03/25/2007, 03/16/2008, 04/06/2009, 03/08/2010  . Td 01/22/2006    Screening Tests Health Maintenance  Topic Date Due  . Zostavax  02/12/1987  . Influenza Vaccine  02/14/2013  . Tetanus/tdap  01/23/2016  . Colonoscopy  10/26/2021  . Pneumococcal Polysaccharide Vaccine Age 82 And Over  Addressed    All answers were reviewed with the patient and necessary referrals were made:  Recardo Linn, MD   09/17/2012   History reviewed: allergies, current medications, past family history, past medical history, past social history, past surgical history and problem list  Review of Systems A comprehensive review of systems was negative.    Objective:     Vision by Snellen chart: copy of December eye exam scanned into  chart Blood pressure 124/64, pulse 71. There is no weight on file to calculate BMI.  BP 124/64  Pulse 71  General Appearance:    Alert, cooperative, no distress, appears stated age  Head:    Normocephalic, without obvious abnormality, atraumatic  Eyes:    PERRL, conjunctiva/corneas clear, EOM's intact,both eyes     He wears lenses.   Ears:    Normal TM's and external ear canals, both ears  Nose:   Nares normal, septum midline, mucosa normal, no drainage    or sinus tenderness  Throat:   Lips, mucosa, and tongue normal; teeth and gums normal  Neck:   Supple, symmetrical, trachea midline, no adenopathy;       thyroid:  No enlargement/tenderness/nodules; no carotid   bruit or JVD  Back:     Symmetric, no curvature, ROM normal, no CVA tenderness  Lungs:     Clear to auscultation bilaterally, respirations unlabored  Chest wall:    No tenderness or deformity  Heart:    Regular rate and rhythm, S1 and S2 normal, no murmur, rub   or gallop  Abdomen:     Soft, non-tender, bowel sounds active all four quadrants,    no masses, no organomegaly  Genitalia:    Not performed.   Rectal:    Not performed.   Extremities:   Extremities normal, atraumatic, he does have some cyanosis on the first and second fingers of his right hand. He also has an AV fistula in his right arm.   Pulses:   2+ and symmetric all extremities  Skin:   Skin color, texture, turgor normal, no rashes or lesions  Lymph nodes:   Cervical, supraclavicular, and axillary nodes normal  Neurologic:   CNII-XII intact. Normal strength, sensation and reflexes      Throughout.  Negative Dix-Hallpike maneuver.        Assessment:     Annual Wellness Exam, Medicare     Plan:     During the course of the visit  the patient was educated and counseled about appropriate screening and preventive services including:    shingles vaccine - now has deductable for drug coverage so can't cover this at this time.   Chronic cough - Will get  CXR today.  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 About 10 years ago was put on nasonex but didn't really seem to help . Does have cat allergies but not cat in home. We'll call a chest x-ray results once available. She certainly has had a history of environmental exposures in the past. If the chest x-rays fairly normal then I would like to set him up for spirometry here in our office to evaluate for possible COPD. It sounds l like he has chronic bronchitis.  Loss of appetite - we could certainly refer him to a nutritionist to help him increase his calories but be safe with his kidney disease. He really can't drink things like boost because of the elevated levels of potassium. His nephrologist is recommended that he avoid these type products.  Speech Changes- certainly this could be just related to normal aging process. But in addition to recently developing a tremor and loss of appetite I would like to consider getting an MRI of his brain. He probably cannot get contrast because of his kidney function. He says he thinks he may have had a scan of his brain I discussed urology office and we will call to see if that was in fact done  Tremor- slight hand tremor.  No signs of pill rolling or evidence of trauma related to other diseases. I suspect it is benign essential tremor. We'll continue to monitor.  Falling -  Will check with  [facility] where he is getting his rehabilitation for his hands to see if they will also do some evaluation for her falls and date.   Diet review for nutrition referral? Yes ____  Not Indicated _x_   Patient Instructions (the written plan) was given to the patient.  Medicare Attestation I have personally reviewed: The patient's medical and social history Their use of alcohol, tobacco or illicit drugs Their current medications and supplements The patient's functional ability including ADLs,fall risks, home safety risks, cognitive, and  hearing and visual impairment Diet and physical activities Evidence for depression or mood disorders  The patient's weight, height, BMI, and visual acuity have been recorded in the chart.  I have made referrals, counseling, and provided education to the patient based on review of the above and I have provided the patient with a written personalized care plan for preventive services.     Keitra Carusone, MD   09/17/2012

## 2012-09-20 ENCOUNTER — Other Ambulatory Visit: Payer: Self-pay | Admitting: Family Medicine

## 2012-09-20 NOTE — Telephone Encounter (Signed)
Patient agreed to referral.  

## 2012-09-20 NOTE — Telephone Encounter (Signed)
Referral placed.

## 2012-09-21 ENCOUNTER — Ambulatory Visit (INDEPENDENT_AMBULATORY_CARE_PROVIDER_SITE_OTHER): Payer: MEDICARE | Admitting: Family Medicine

## 2012-09-21 VITALS — BP 116/54 | HR 74

## 2012-09-21 DIAGNOSIS — Z4800 Encounter for change or removal of nonsurgical wound dressing: Secondary | ICD-10-CM

## 2012-09-21 DIAGNOSIS — M7020 Olecranon bursitis, unspecified elbow: Secondary | ICD-10-CM

## 2012-09-21 DIAGNOSIS — Z5189 Encounter for other specified aftercare: Secondary | ICD-10-CM

## 2012-09-21 NOTE — Progress Notes (Signed)
  Subjective:    Patient ID: Stanley Meyers, male    DOB: 05/15/1925, 79 y.o.   MRN: [id number] Covered with 4x4 and wrapped with kerlex after packed with iodaform by Dr. Ivan Anchors.  Stanley Cory, LPN  HPI    Review of Systems     Objective:   Physical Exam        Assessment & Plan:

## 2012-09-21 NOTE — Progress Notes (Signed)
CC: Stanley Meyers is a 79 y.o. male is here for Dressing Change   Subjective: HPI:  Patient presents for packing check and wound check. Packing was packed with iodoform gauze last week. Packing accidentally removed sometime yesterday during bandage change. Family reports clear to straw colored discharge. Patient denies redness, swelling, fevers, chills, rapid heartbeat, nausea. Confirms taking Bactrim on a twice-daily basis. Denies pain   Review Of Systems Outlined In HPI  Past Medical History  Diagnosis Date  . Kidney failure   . GERD (gastroesophageal reflux disease)   . Hypertension   . Hyperlipemia      No family history on file.   History  Substance Use Topics  . Smoking status: Former Games developer  . Smokeless tobacco: Not on file  . Alcohol Use: 1.2 oz/week    2 Glasses of wine per week     Objective: Filed Vitals:   09/21/12 1413  BP: 116/54  Pulse: 74    Vital signs reviewed. General: Alert and Oriented, No Acute Distress Lungs: Clear and comfortable work of breathing, speaking in full sentences without accessory muscle use. Cardiac: Regular rate and rhythm.  Neuro: CN II-XII grossly intact, gait normal. Extremities: No peripheral edema.  Strong peripheral pulses.  Mental Status: No depression, anxiety, nor agitation. Logical though process. MSK: Full range of motion and strength of the right elbow. Skin: Warm and dry. 5 mm deep well healing incision on the dorsal right elbow without discharge, purulence, nor erythema   Assessment & Plan: Maleik was seen today for dressing change.  Diagnoses and associated orders for this visit:  Visit for wound check    To facilitate further healing and encourage drainage 1 cm of iodoform wick placed without difficulty in the remaining abscess cavity. Provided reassurance no sign of infection and healing appears appropriate. Continue Bactrim and follow up with PCP next week.Signs and symptoms requring emergent/urgent  reevaluation were discussed with the patient.  Return in about 1 week (around 09/28/2012).

## 2012-09-27 ENCOUNTER — Emergency Department
Admission: EM | Admit: 2012-09-27 | Discharge: 2012-09-27 | Disposition: A | Discharge status: Home / Self Care | Payer: MEDICARE | Source: Home / Self Care | Attending: Family Medicine | Admitting: Family Medicine

## 2012-09-27 ENCOUNTER — Encounter: Payer: Self-pay | Admitting: *Deleted

## 2012-09-27 ENCOUNTER — Emergency Department (INDEPENDENT_AMBULATORY_CARE_PROVIDER_SITE_OTHER): Payer: MEDICARE

## 2012-09-27 DIAGNOSIS — R1012 Left upper quadrant pain: Secondary | ICD-10-CM

## 2012-09-27 DIAGNOSIS — Z87891 Personal history of nicotine dependence: Secondary | ICD-10-CM

## 2012-09-27 DIAGNOSIS — R141 Gas pain: Secondary | ICD-10-CM

## 2012-09-27 DIAGNOSIS — K5900 Constipation, unspecified: Secondary | ICD-10-CM

## 2012-09-27 DIAGNOSIS — R197 Diarrhea, unspecified: Secondary | ICD-10-CM

## 2012-09-27 DIAGNOSIS — R142 Eructation: Secondary | ICD-10-CM

## 2012-09-27 DIAGNOSIS — R091 Pleurisy: Secondary | ICD-10-CM

## 2012-09-27 HISTORY — DX: Irritable bowel syndrome, unspecified: K58.9

## 2012-09-27 HISTORY — DX: Raynaud's syndrome without gangrene: I73.00

## 2012-09-27 LAB — POCT CBC W AUTO DIFF (K'VILLE URGENT CARE)

## 2012-09-27 MED ORDER — PANTOPRAZOLE SODIUM 40 MG PO TBEC: 40.0000 mg | DELAYED_RELEASE_TABLET | Freq: Every day | ORAL | Status: DC

## 2012-09-27 MED ORDER — POLYETHYLENE GLYCOL 3350 17 G PO PACK: 17.0000 g | PACK | Freq: Two times a day (BID) | ORAL | Status: DC

## 2012-09-27 NOTE — ED Notes (Signed)
Stanley Meyers c/o LUQ abdominal pain x yesterday. He reports constipation of 2-3 days, took MOM last night and produced a bowel movement today. He currently has no pain but feels pain in his LUQ when raising his left arm. He sees digestive health for spasmatic bowel. Denies nausea, fever or chills.

## 2012-09-27 NOTE — ED Provider Notes (Addendum)
History     CSN: [id number]  Arrival date & time 09/27/12  1147   First MD Initiated Contact with Patient 09/27/12 1204      Chief Complaint  Patient presents with  . Abdominal Pain   HPI  LUQ abd pain x 2-3 days  Patient is unsure but believes pain may have pulled him up from sleep a couple nights ago. No fevers or chills. Pain does have some radiation towards the back. Pain has been a tabs worse. Patient states the pain seemed to be worse and sometimes left upper arm movement. No chest pain, shortness of breath, diaphoresis. Patient does have a baseline history of reflux and patient is unsure this is associated with this. Pain does not seem to be worsened by food.  Baseline history of stage IV CKD. Patient still making urine. Has a fistula in place in his right upper extremity that he has not had to use. No history of cancer, no easy bruising. No dysuria or diarrhea. Patient does report he has a history of chronic constipation. Patient states that he took some milk of magnesia yesterday as well as a glycerin suppository to have minimal improvement in symptoms.    Past Medical History  Diagnosis Date  . Kidney failure   . GERD (gastroesophageal reflux disease)   . Hypertension   . Hyperlipemia   . Spasmatic colon   . Raynaud's disease     Past Surgical History  Procedure Laterality Date  . Cholecystectomy    . Hernia repair    . Av fistula placement      History reviewed. No pertinent family history.  History  Substance Use Topics  . Smoking status: Former Games developer  . Smokeless tobacco: Never Used  . Alcohol Use: 1.2 oz/week    2 Glasses of wine per week      Review of Systems  All other systems reviewed and are negative.    Allergies  Review of patient's allergies indicates no known allergies.  Home Medications   Current Outpatient Rx  Name  Route  Sig  Dispense  Refill  . acetaminophen (TYLENOL) 500 MG tablet   Oral   Take 1,000 mg by mouth 2  (two) times daily.         Marland Kitchen alendronate (FOSAMAX) 70 MG tablet   Oral   Take 1 tablet (70 mg total) by mouth every 7 (seven) days. Take with a full glass of water on an empty stomach.   12 tablet   3   . AMBULATORY NON FORMULARY MEDICATION      Medication Name: Zostavax one time injection.   1 vial   0   . AMBULATORY NON FORMULARY MEDICATION      Medication Name: Shingles vaccine IM x 1   1 vial   0   . amLODipine (NORVASC) 5 MG tablet   Oral   Take 1 tablet (5 mg total) by mouth daily.   90 tablet   2   . calcitRIOL (ROCALTROL) 0.25 MCG capsule   Oral   Take 1 capsule (0.25 mcg total) by mouth 2 (two) times a week. Take one tablet by mouth 2 times weekly   30 capsule   1   . dicyclomine (BENTYL) 20 MG tablet   Oral   Take 20 mg by mouth every 6 (six) hours.         . furosemide (LASIX) 20 MG tablet   Oral   Take 1 tablet (20 mg total) by  mouth daily.   90 tablet   2   . guaiFENesin (MUCINEX) 600 MG 12 hr tablet   Oral   Take 600 mg by mouth daily.         Marland Kitchen lovastatin (MEVACOR) 20 MG tablet   Oral   Take 1 tablet (20 mg total) by mouth at bedtime.   90 tablet   2   . Multiple Vitamins-Minerals (MULTIVITAMIN PO)   Oral   Take by mouth.         Marland Kitchen omeprazole (PRILOSEC) 40 MG capsule   Oral   Take 1 capsule (40 mg total) by mouth daily.   90 capsule   1     BP 109/61  Pulse 65  Temp(Src) 97.7 F (36.5 C) (Oral)  Resp 16  Ht 5\' 7"  (1.702 m)  Wt 125 lb (56.7 kg)  BMI 19.57 kg/m2  SpO2 99%  Physical Exam  Constitutional: He appears well-developed and well-nourished.  HENT:  Head: Normocephalic and atraumatic.  Eyes: Conjunctivae are normal. Pupils are equal, round, and reactive to light.  Neck: Normal range of motion. Neck supple.  Cardiovascular: Normal rate and regular rhythm.   Pulmonary/Chest: Effort normal.  Abdominal: Soft.  + bowel sounds.  + mild-moderate LUQ TTP    Musculoskeletal: Normal range of motion.   Neurological: He is alert.  Skin: Skin is warm.    ED Course  Procedures (including critical care time)  Labs Reviewed  COMPREHENSIVE METABOLIC PANEL  LIPASE  CBC WITH DIFFERENTIAL   No results found.   1. Abdominal pain, left upper quadrant       MDM  Differential diagnosis remains fairly broad for symptoms including gastritis, colitis, gastroenteritis pancreatitis, pneumonia, splenomegaly, pyelonephritis, as well as kidney stones. Will obtain a CT scan of the abdomen and pelvis without IV contrast given stage IV renal disease. Will add oral contrast. We'll check baseline labs including CBC, cemented, lipase. We'll also check chest x-ray to eval for any type of pneumonia contributing to symptoms. If, overall workup is negative, will increase PPI place patient on schedule MiraLAX. We'll followup pending results of CT scan.    The patient and/or caregiver has been counseled thoroughly with regard to treatment plan and/or medications prescribed including dosage, schedule, interactions, rationale for use, and possible side effects and they verbalize understanding. Diagnoses and expected course of recovery discussed and will return if not improved as expected or if the condition worsens. Patient and/or caregiver verbalized understanding.             Doree Albee, MD 09/27/12 1342  Ct Abdomen Pelvis Wo Contrast  09/27/2012  *RADIOLOGY REPORT*  Clinical Data: Left upper quadrant abdominal pain, bloating, diarrhea, constipation  CT ABDOMEN AND PELVIS WITHOUT CONTRAST  Technique:  Multidetector CT imaging of the abdomen and pelvis was performed following the standard protocol without intravenous contrast.  Comparison: CT abdomen of 11/10/2008  Findings: Asbestos related changes are again noted at both lung bases with calcified hemidiaphragms and calcified pleural plaques bilaterally. Cardiomegaly is stable.  The liver is unremarkable in the unenhanced state with several  calcified granulomas with multiple calcified splenic granulomas also present.  Surgical clips are present from prior cholecystectomy.  The pancreas is normal in size and the pancreatic duct is not dilated.  The adrenal glands are stable and the spleen is unchanged in size.  On this unenhanced study multiple low attenuation renal lesions are present bilaterally which are difficult to assess, most likely representing cysts.  One of  the larger lesions in the mid lower left kidney posterolaterally is slightly higher in attenuation, but probably represents a slightly complex cyst, not significantly changed compared to 2010 ml.  No renal calculi are noted and no hydronephrosis is seen.  The abdominal aorta is very ectatic but no focal aneurysm is noted.  No adenopathy is seen.  The urinary bladder is moderately urine distended with no abnormality noted.  The prostate is normal in size with small prostatic calculi present.  No pelvic mass or fluid is seen. Multiple rectosigmoid colonic diverticula are noted.  There is a moderate amount of feces throughout the entire colon.  The terminal ileum and the appendix are unremarkable with the cecum is slightly high in position.  There is a lumbar scoliosis present with diffuse degenerative change throughout the lumbar spine.  IMPRESSION:  1.  No explanation for the patient's left upper quadrant pain is seen. 2.  Moderate amount of feces throughout the entire colon. 3.  Stable changes of asbestos related pleural plaques and calcified hemidiaphragms. 4.  Multiple splenic and hepatic calcified granulomas from prior granulomatous disease. 5.  Multiple rectosigmoid colonic diverticula.   Original Report Authenticated By: Dwyane Dee, M.D.    Dg Chest 2 View  09/27/2012  *RADIOLOGY REPORT*  Clinical Data: Left upper quadrant pain for 1 day, former smoking history  CHEST - 2 VIEW  Comparison: CT chest of 09/27/2012 and chest x-ray of 09/17/2012  Findings: The lungs remain hyperaerated  consistent with emphysema. Multiple bilateral calcified pleural plaques remain.  No focal infiltrate or effusion is seen.  The heart is within upper limits normal in stable.  The bones are osteopenic.  IMPRESSION: Stable chronic change with hyperaeration and multiple calcified pleural plaques bilaterally.  No definite active process.   Original Report Authenticated By: Dwyane Dee, M.D.     Called and discussed imaging results with patient's wife. (Patient's wife was present at visit today).  Discussed no acute findings on imaging apart from what was previously present. No pneumonia on chest x-ray. Noted moderate amount of stool in the colon. Will increase patient's dose of PPI as well as start patient on scheduled twice a day MiraLAX. Discussed with wife that if the symptoms worsen between now and his followup with his gastroenterologist to go to the ER for further evaluation. Otherwise followup with gastroenterologist as previously scheduled.       Doree Albee, MD 09/27/12 1540

## 2012-09-28 ENCOUNTER — Ambulatory Visit (INDEPENDENT_AMBULATORY_CARE_PROVIDER_SITE_OTHER): Payer: MEDICARE | Admitting: Sports Medicine

## 2012-09-28 ENCOUNTER — Encounter: Payer: Self-pay | Admitting: Sports Medicine

## 2012-09-28 VITALS — BP 108/51 | HR 69 | Wt 127.0 lb

## 2012-09-28 DIAGNOSIS — M19049 Primary osteoarthritis, unspecified hand: Secondary | ICD-10-CM

## 2012-09-28 DIAGNOSIS — M7020 Olecranon bursitis, unspecified elbow: Secondary | ICD-10-CM

## 2012-09-28 DIAGNOSIS — M7021 Olecranon bursitis, right elbow: Secondary | ICD-10-CM

## 2012-09-28 LAB — COMPREHENSIVE METABOLIC PANEL
ALT: 16 U/L (ref 0–53)
BUN: 44 mg/dL — ABNORMAL HIGH (ref 6–23)
CO2: 22 mEq/L (ref 19–32)
Calcium: 9.5 mg/dL (ref 8.4–10.5)
Creat: 2.77 mg/dL — ABNORMAL HIGH (ref 0.50–1.35)
Glucose, Bld: 86 mg/dL (ref 70–99)
Total Bilirubin: 0.4 mg/dL (ref 0.3–1.2)

## 2012-09-28 LAB — LIPASE: Lipase: 22 U/L (ref 0–75)

## 2012-09-28 NOTE — Assessment & Plan Note (Signed)
Resolved. Return as needed for this. 

## 2012-09-28 NOTE — Assessment & Plan Note (Signed)
Significantly improved with formal physical therapy.

## 2012-09-28 NOTE — Progress Notes (Signed)
   Subjective:    CC: Followup  HPI: Right olecranon bursitis: Status post incision and drainage, and is just finishing 2 weeks of Septra. Cultures did grow out a pansensitive Staphylococcus aureus. Overall symptoms continue to improve, swelling is gone, drainage is stopped, erythema is gone. There is no pain.  Bilateral hand osteoarthritis: improved strength and range of motion with formal physical therapy.  Of note it sounds as though he started having increasing falls, the physical therapists are working with him on balance.  Past medical history, Surgical history, Family history not pertinant except as noted below, Social history, Allergies, and medications have been entered into the medical record, reviewed, and no changes needed.   Review of Systems: No headache, visual changes, nausea, vomiting, diarrhea, constipation, dizziness, abdominal pain, skin rash, fevers, chills, night sweats, weight loss, swollen lymph nodes, body aches, joint swelling, muscle aches, chest pain, shortness of breath, mood changes, visual or auditory hallucinations.   Objective:   General: Well Developed, well nourished, and in no acute distress.  Neuro/Psych: Alert and oriented x3, extra-ocular muscles intact, able to move all 4 extremities, sensation grossly intact. Skin: Warm and dry, no rashes noted.  Respiratory: Not using accessory muscles, speaking in full sentences, trachea midline.  Cardiovascular: Pulses palpable, no extremity edema. Abdomen: Does not appear distended. Right Elbow: Olecranon bursa looks much better, no erythema, no drainage. No tenderness to palpation. Range of motion full pronation, supination, flexion, extension. Strength is full to all of the above directions Stable to varus, valgus stress. Negative moving valgus stress test. No discrete areas of tenderness to palpation. Ulnar nerve does not sublux. Negative cubital tunnel Tinel's.  Impression and Recommendations:   This  case required medical decision making of moderate complexity.

## 2012-09-29 ENCOUNTER — Encounter: Payer: Self-pay | Admitting: Family Medicine

## 2012-09-30 ENCOUNTER — Encounter: Payer: Self-pay | Admitting: Family Medicine

## 2012-09-30 ENCOUNTER — Telehealth: Payer: Self-pay | Admitting: Emergency Medicine

## 2012-10-01 ENCOUNTER — Encounter: Payer: MEDICARE | Attending: Family Medicine | Admitting: *Deleted

## 2012-10-01 ENCOUNTER — Encounter: Payer: Self-pay | Admitting: *Deleted

## 2012-10-01 VITALS — Ht 67.0 in | Wt 124.2 lb

## 2012-10-01 DIAGNOSIS — Z713 Dietary counseling and surveillance: Secondary | ICD-10-CM | POA: Insufficient documentation

## 2012-10-01 DIAGNOSIS — N184 Chronic kidney disease, stage 4 (severe): Secondary | ICD-10-CM | POA: Insufficient documentation

## 2012-10-01 DIAGNOSIS — R630 Anorexia: Secondary | ICD-10-CM | POA: Insufficient documentation

## 2012-10-01 NOTE — Progress Notes (Signed)
Medical Nutrition Therapy:  Appt start time: 1030 end time:  1130.  Assessment:  Primary concern today: Chronic kidney disease stage 4 and weight loss. He has been diagnosed with CKD for 6 years. He has a fistula in place, but has not required dialysis. His potassium is slightly elevated at 5.6. He reports that he has been following a low potassium diet. He states that his appetite has declined since diagnosis. He was also diagnosed with spasmodic stomach, which limits food intake. He has lost about 5 pounds since December, and would like to maintain weight around 125 pounds. He can't drink Boost/Ensure due to high potassium content. He also reports that he sometimes forgets to eat meals/snacks and has to be reminded by wife to eat.   MEDICATIONS: Pantoprazole, lasix, calcitriol, lovastatin, Centrum Silver   DIETARY INTAKE:   Usual eating pattern includes 3 meals and 2 snacks per day.  24-hr recall:  B ( AM): Cold cereal or oatmeal or eggs, water/hot tea with honey Snk ( AM): Hot tea, roll, fruit (blueberries/strawberries/pineapple, grapes)  L ( PM): Cold cut sandwich with lettuce and tomato and cheese OR soup, fruit, cookies Snk ( PM): Not usually, forgets, yogurt, applesauce/peach or pear cups, crackers (saltines) D ( PM): Poultry/fish/beef, vegetable, mashed potato/rice/pasta Snk ( PM): Same Beverages: Water, juice, hot tea  Usual physical activity: Limited due to balance issues, seeing a physical therapist  Estimated energy needs: 1700 calories 255 g carbohydrates 64 g protein 42 g fat  Progress Towards Goal(s):  In progress.   Nutritional Diagnosis:  [place]-3.2 Unintentional weight loss As related to decreased appetite.  As evidenced by 5 pound weight loss over 5 months.    Intervention:  Nutrition counseling. We discussed strategies for increasing calories, including eating at least 6 small meals daily, adding fats (oil, butter, mayo, salad dressing) to foods, adding sugar (honey,  syrup) to foods, and utilizing renal nutrition supplements. We also discussed the renal diet, including limiting foods high in potassium and phosphorus.  Goal:  1. Maintain weight >/= 125 pounds.  2. Increase calories by adding fat and sugar containing foods to meals.  3. Eat 6 small meals daily.  4. Drink 1 renal nutrition supplement daily ([place]/RenalCal, 8 oz contains 450-500 calories, 8-10 g protein).   Handouts given during visit include:  Choose Your Meal booklet for renal failure  High Calorie nutrition therapy handout  Monitoring/Evaluation:  Dietary intake, exercise, renal labs, and body weight prn.

## 2012-10-01 NOTE — Patient Instructions (Signed)
Goal:  1. Maintain weight >/= 125 pounds.  2. Increase calories by adding fat and sugar containing foods to meals.  3. Eat 6 small meals daily.  4. Drink 1 renal nutrition supplement daily (Suplena/RenalCal, 8 oz contains 450-500 calories, 8-10 g protein).

## 2012-10-06 ENCOUNTER — Ambulatory Visit (INDEPENDENT_AMBULATORY_CARE_PROVIDER_SITE_OTHER): Payer: MEDICARE | Admitting: Family Medicine

## 2012-10-06 ENCOUNTER — Encounter: Payer: Self-pay | Admitting: Family Medicine

## 2012-10-06 VITALS — BP 108/53 | HR 61 | Ht 67.0 in | Wt 127.0 lb

## 2012-10-06 DIAGNOSIS — Z111 Encounter for screening for respiratory tuberculosis: Secondary | ICD-10-CM

## 2012-10-06 DIAGNOSIS — R5381 Other malaise: Secondary | ICD-10-CM

## 2012-10-06 DIAGNOSIS — N184 Chronic kidney disease, stage 4 (severe): Secondary | ICD-10-CM

## 2012-10-06 DIAGNOSIS — R5383 Other fatigue: Secondary | ICD-10-CM

## 2012-10-06 DIAGNOSIS — R05 Cough: Secondary | ICD-10-CM

## 2012-10-06 DIAGNOSIS — D649 Anemia, unspecified: Secondary | ICD-10-CM

## 2012-10-06 DIAGNOSIS — E875 Hyperkalemia: Secondary | ICD-10-CM

## 2012-10-06 DIAGNOSIS — J449 Chronic obstructive pulmonary disease, unspecified: Secondary | ICD-10-CM | POA: Insufficient documentation

## 2012-10-06 LAB — PULMONARY FUNCTION TEST

## 2012-10-06 MED ORDER — ALBUTEROL SULFATE (2.5 MG/3ML) 0.083% IN NEBU
2.5000 mg | INHALATION_SOLUTION | Freq: Once | RESPIRATORY_TRACT | Status: AC
  Administered 2012-10-06: 2.5 mg via RESPIRATORY_TRACT

## 2012-10-06 MED ORDER — ACLIDINIUM BROMIDE 400 MCG/ACT IN AEPB: 1.0000 | INHALATION_SPRAY | Freq: Two times a day (BID) | RESPIRATORY_TRACT | Status: DC

## 2012-10-06 MED ORDER — ALBUTEROL SULFATE HFA 108 (90 BASE) MCG/ACT IN AERS: 2.0000 | INHALATION_SPRAY | Freq: Four times a day (QID) | RESPIRATORY_TRACT | Status: DC | PRN

## 2012-10-06 NOTE — Progress Notes (Signed)
Subjective:    Patient ID: Stanley Meyers, male    DOB: June 16, 1925, 79 y.o.   MRN: [id number]  HPI Chronic cough - Will get CXR today. 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 About 10 years ago was put on nasonex but didn't really seem to help . Does have cat allergies but not cat in home. We'll call a chest x-ray results once available. He certainly has had a history of environmental exposures in the past. If the chest x-rays fairly normal then I would like to set him up for spirometry here in our office to evaluate for possible COPD. It sounds l like he has chronic bronchitis.does feel more SOB when goes up stairs, but not CP. Sarted to notice more in the last few months. Has had a chronic cough with sputum for years.  Says not energy to walk as far.  Currently he is afebrile. His wife notes he has been a little bit more fatigued the last couple of months.  Recently went to urgent care for abdominal pain. He is feeling better. He is still having symptoms with diarrhea and has an appointment with GI next week. His labs were normal. He was hyperkalemic. His hemoglobin was also down from his baseline. His BUN and creatinine were up from baseline as well. I did forward the results from urgent care to his nephrologist.  Chronic kidney disease, stage IV-he did go see the nutritionist and had a really good visit. They got some helpful information. He is still really struggling with diarrhea and hasn't seen GI next week. He would like his recent blood work results sent to Dr. Eliott Nine his nephrologist.  Review of Systems     Objective:   Physical Exam  Constitutional: He is oriented to person, place, and time. He appears well-developed and well-nourished.  HENT:  Head: Normocephalic and atraumatic.  Neurological: He is alert and oriented to person, place, and time.  Skin: Skin is warm and dry.  Psychiatric: He has a normal mood and affect. His behavior is normal.           Assessment & Plan:  Chronic cough -  Here for spirometry.  REviewed results. See discussion below under COPD.  COPD -reviewed her results of spirometry with him today.Spirometry performed on 10/06/2012. FEV1 ratio of 67%, FEV1 of 64%, an FVC of 74%. No reversibility with albuterol. His symptoms are consistent with chronic bronchitis with a persistent chronic productive cough for most 2 years. His chest x-ray shows some chronic granulomatous disease but nothing active. 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 His results are consistent with moderate obstruction. I would like to try tudorza for a month. Samples given. To see if this helps with his chronic cough and see if it improves his shortness of breath with activities. I will see him back in about 2 weeks. His wife is here to go over the results today as well. He was also given a prescription for albuterol to use only as needed if he notices shortness of breath, chest tightness, wheezing or persistent cough.  Abdominal pain-improved but he is still having some episodes of diarrhea and has a followup GI next week. Afebrile.  Hyperkalemia-repeat today. May have been secondary to some mild dehydration or hemolysis of the specimen. Will forward the results to his nephrologist.  Increase in  BUN and creatinine on top of chronic kidney disease, stage IV-recheck BUN and creatinine today. I suspect he is probably better hydrated so hopefully his numbers are back down to baseline.  Anemia-we will also check B12, folate and iron. He was receiving Aranesp injections monthly up until about a year ago after his numbers, stabilized. Time spent 25 minutes in counseling about his spirometry results  Time spent 25 minutes counseling about the results of the spirometry as well as his recent abnormal lab results.

## 2012-10-07 LAB — BASIC METABOLIC PANEL WITH GFR
BUN: 42 mg/dL — ABNORMAL HIGH (ref 6–23)
Creat: 1.96 mg/dL — ABNORMAL HIGH (ref 0.50–1.35)
GFR, Est African American: 35 mL/min — ABNORMAL LOW
GFR, Est Non African American: 30 mL/min — ABNORMAL LOW
Glucose, Bld: 97 mg/dL (ref 70–99)

## 2012-10-07 LAB — VITAMIN B12: Vitamin B-12: 471 pg/mL (ref 211–911)

## 2012-10-07 LAB — CBC
HCT: 35.2 % — ABNORMAL LOW (ref 39.0–52.0)
Hemoglobin: 11.5 g/dL — ABNORMAL LOW (ref 13.0–17.0)
MCH: 29.6 pg (ref 26.0–34.0)
MCHC: 32.7 g/dL (ref 30.0–36.0)
RDW: 15.1 % (ref 11.5–15.5)

## 2012-10-07 LAB — FERRITIN: Ferritin: 80 ng/mL (ref 22–322)

## 2012-10-08 LAB — TB SKIN TEST
Induration: 0 mm
TB Skin Test: NEGATIVE

## 2012-10-15 ENCOUNTER — Ambulatory Visit: Payer: MEDICARE | Admitting: Family Medicine

## 2012-10-20 ENCOUNTER — Ambulatory Visit (INDEPENDENT_AMBULATORY_CARE_PROVIDER_SITE_OTHER): Payer: MEDICARE | Admitting: Family Medicine

## 2012-10-20 ENCOUNTER — Encounter: Payer: Self-pay | Admitting: Family Medicine

## 2012-10-20 VITALS — BP 113/54 | HR 63 | Wt 130.0 lb

## 2012-10-20 DIAGNOSIS — J449 Chronic obstructive pulmonary disease, unspecified: Secondary | ICD-10-CM

## 2012-10-20 DIAGNOSIS — R2689 Other abnormalities of gait and mobility: Secondary | ICD-10-CM

## 2012-10-20 DIAGNOSIS — F809 Developmental disorder of speech and language, unspecified: Secondary | ICD-10-CM

## 2012-10-20 DIAGNOSIS — N184 Chronic kidney disease, stage 4 (severe): Secondary | ICD-10-CM

## 2012-10-20 DIAGNOSIS — R4789 Other speech disturbances: Secondary | ICD-10-CM

## 2012-10-20 DIAGNOSIS — R413 Other amnesia: Secondary | ICD-10-CM

## 2012-10-20 DIAGNOSIS — J4489 Other specified chronic obstructive pulmonary disease: Secondary | ICD-10-CM

## 2012-10-20 DIAGNOSIS — R29818 Other symptoms and signs involving the nervous system: Secondary | ICD-10-CM

## 2012-10-20 NOTE — Progress Notes (Signed)
  Subjective:    Patient ID: Stanley Meyers, male    DOB: [birth year], 79 y.o.   MRN: [id number]  HPI COPD - tolerating Tudorza well. Started it about 2 weeks ago. He has not had any side effects on the medication. No sore throat a raspiness. His wife is here with him today for his office visit. No recent exacerbations of his breathing. No current shortness of breath.  Still  balance issues. Has been using his cane and doing PT twice a week. They have given him some exercises to do on his own at home while sitting on the floor. His wife is fearful to get him back to the [facility] for silver sneakers because of his recent falls..   Wife says hard time finding words at time and balance problems in the last year. She says even this morning he could not remember my name. He thought initially he may have had a scan of his head done to Gastroenterology for him we will call they did not have any imaging reports of his brain or head.   Review of Systems     Objective:   Physical Exam  Constitutional: He is oriented to person, place, and time. He appears well-developed and well-nourished.  HENT:  Head: Normocephalic and atraumatic.  Cardiovascular: Normal rate, regular rhythm and normal heart sounds.   Pulmonary/Chest: Effort normal and breath sounds normal.  Musculoskeletal:  He stumbled backwards after he stood up. He was able to get up on the examining table by himself.  Neurological: He is alert and oriented to person, place, and time.  Skin: Skin is warm and dry.  Psychiatric: He has a normal mood and affect. His behavior is normal.          Assessment & Plan:  COPD - doing well on Tudorza. Continue current regimen. Lung exam is normal today. He has not needed to use his rescue inhaler.  Gait instability-continue physical therapy. Encouraged him to use his maximal number of visits for this to improve his gait and to make sure he is doing his home exercises. I would also like to get an MRI  of his brain. This has certainly progressed over the last year. Even just standing up in the room here today he almost fell backwards. Evaluate for evidence of infarct, tumor or normal pressure hydrocephalus. Next  Difficulty with word finding-like to do an MRI to evaluate for possible old infarct. If we do see evidence of this could consider putting him on a baby aspirin daily for prevention that we would need to check with his nephrologist to make sure that it is safe to do so.

## 2012-10-21 ENCOUNTER — Encounter: Payer: Self-pay | Admitting: Family Medicine

## 2012-10-21 ENCOUNTER — Telehealth: Payer: Self-pay | Admitting: *Deleted

## 2012-10-21 NOTE — Telephone Encounter (Signed)
Authorization obtained for MRI brain without contrast for [facility] location. Auth # [id number]. Carolyn notified at imaging. Barry Dienes, LPN

## 2012-10-30 ENCOUNTER — Ambulatory Visit (HOSPITAL_BASED_OUTPATIENT_CLINIC_OR_DEPARTMENT_OTHER)
Admission: RE | Admit: 2012-10-30 | Discharge: 2012-10-30 | Disposition: A | Discharge status: Home / Self Care | Payer: MEDICARE | Source: Ambulatory Visit | Attending: Family Medicine | Admitting: Family Medicine

## 2012-10-30 DIAGNOSIS — N189 Chronic kidney disease, unspecified: Secondary | ICD-10-CM | POA: Insufficient documentation

## 2012-10-30 DIAGNOSIS — R279 Unspecified lack of coordination: Secondary | ICD-10-CM | POA: Insufficient documentation

## 2012-10-30 DIAGNOSIS — Z9181 History of falling: Secondary | ICD-10-CM | POA: Insufficient documentation

## 2012-10-30 DIAGNOSIS — I129 Hypertensive chronic kidney disease with stage 1 through stage 4 chronic kidney disease, or unspecified chronic kidney disease: Secondary | ICD-10-CM | POA: Insufficient documentation

## 2012-10-30 DIAGNOSIS — G319 Degenerative disease of nervous system, unspecified: Secondary | ICD-10-CM | POA: Insufficient documentation

## 2012-10-30 DIAGNOSIS — R4789 Other speech disturbances: Secondary | ICD-10-CM

## 2012-10-30 DIAGNOSIS — R2689 Other abnormalities of gait and mobility: Secondary | ICD-10-CM

## 2012-10-30 DIAGNOSIS — R413 Other amnesia: Secondary | ICD-10-CM | POA: Insufficient documentation

## 2012-11-11 ENCOUNTER — Other Ambulatory Visit: Payer: Self-pay | Admitting: *Deleted

## 2012-11-11 MED ORDER — ACLIDINIUM BROMIDE 400 MCG/ACT IN AEPB: 1.0000 | INHALATION_SPRAY | Freq: Two times a day (BID) | RESPIRATORY_TRACT | Status: DC

## 2012-11-12 ENCOUNTER — Telehealth: Payer: Self-pay

## 2012-11-12 NOTE — Telephone Encounter (Signed)
Called in medication. Wife advised.

## 2012-11-12 NOTE — Telephone Encounter (Signed)
It looks like it was  sent to [facility] yesterday. He may want to check with the pharmacy and if they still have it then we can try to resend it.

## 2012-11-12 NOTE — Telephone Encounter (Signed)
Stanley Meyers needs a prescription for the [place].

## 2012-11-22 ENCOUNTER — Ambulatory Visit: Payer: BC Managed Care – PPO | Admitting: Family Medicine

## 2012-11-24 ENCOUNTER — Other Ambulatory Visit: Payer: Self-pay | Admitting: *Deleted

## 2012-11-24 MED ORDER — CALCITRIOL 0.25 MCG PO CAPS: 0.2500 ug | ORAL_CAPSULE | ORAL | Status: DC

## 2012-12-08 ENCOUNTER — Telehealth: Payer: Self-pay | Admitting: *Deleted

## 2012-12-08 NOTE — Telephone Encounter (Signed)
Did not receive the order for pt to continue to obtain care for the following DOS 5.6.14. I faxed this page over to their office.Loralee Pacas [place]

## 2012-12-15 ENCOUNTER — Encounter: Payer: Self-pay | Admitting: Family Medicine

## 2012-12-20 ENCOUNTER — Ambulatory Visit (INDEPENDENT_AMBULATORY_CARE_PROVIDER_SITE_OTHER): Payer: MEDICARE | Admitting: Sports Medicine

## 2012-12-20 ENCOUNTER — Other Ambulatory Visit: Payer: Self-pay | Admitting: *Deleted

## 2012-12-20 ENCOUNTER — Encounter: Payer: Self-pay | Admitting: Sports Medicine

## 2012-12-20 VITALS — BP 131/62 | HR 68 | Wt 128.0 lb

## 2012-12-20 DIAGNOSIS — M7021 Olecranon bursitis, right elbow: Secondary | ICD-10-CM

## 2012-12-20 DIAGNOSIS — M7020 Olecranon bursitis, unspecified elbow: Secondary | ICD-10-CM

## 2012-12-20 MED ORDER — SULFAMETHOXAZOLE-TRIMETHOPRIM 800-160 MG PO TABS: 1.0000 | ORAL_TABLET | Freq: Two times a day (BID) | ORAL | Status: DC

## 2012-12-20 MED ORDER — SULFAMETHOXAZOLE-TRIMETHOPRIM 800-160 MG PO TABS: 1.0000 | ORAL_TABLET | Freq: Two times a day (BID) | ORAL | Status: AC

## 2012-12-20 NOTE — Progress Notes (Signed)
  Subjective:    CC: Recheck olecranon bursa  HPI: I treated Terrelle for a right-sided septic olecranon bursitis several months ago, I performed an incision and drainage of the olecranon bursa, as well as later for packing. He went through 2 courses of antibiotics, and symptoms resolved. Unfortunately he's noted a persistent amount of yellowish drainage without pain, and no constitutional symptoms. Symptoms are moderate, persistent, stable. He has no abnormalities in function of his elbow itself.  Past medical history, Surgical history, Family history not pertinant except as noted below, Social history, Allergies, and medications have been entered into the medical record, reviewed, and no changes needed.   Review of Systems: No fevers, chills, night sweats, weight loss, chest pain, or shortness of breath.   Objective:    General: Well Developed, well nourished, and in no acute distress.  Neuro: Alert and oriented x3, extra-ocular muscles intact, sensation grossly intact.  HEENT: Normocephalic, atraumatic, pupils equal round reactive to light, neck supple, no masses, no lymphadenopathy, thyroid nonpalpable.  Skin: Warm and dry, no rashes. Cardiac: Regular rate and rhythm, no murmurs rubs or gallops, no lower extremity edema.  Respiratory: Clear to auscultation bilaterally. Not using accessory muscles, speaking in full sentences. Right elbow: Right olecranon bursa is not erythematous and not swollen, there's a small amount of drainage from the prior location of the I&D.  Procedure:  Complicated incision and drainage of infected right olecranon bursa Risks, benefits, and alternatives explained and consent obtained. Time out conducted. Surface cleaned with Betadine. 5 cc of lidocaine with epinephrine used in a field block. Lesion was fairly nontender, and incision was still patent after blunt dissection. A small amount of seropurulent discharge was seen. Iodoform packing placed leaving a  1-inch tail, approximately 6 inches placed inside the olecranon bursa. Hemostasis achieved. Pt stable. Aftercare and follow-up advised.  Impression and Recommendations:

## 2012-12-20 NOTE — Assessment & Plan Note (Addendum)
Repeat incision and drainage with packing of approximately 6 inches of iodoform. An additional course of antibiotics. Return in 1 week. I would likely leave in the packing for one to 2 weeks. We will do this entire process much more slowly this time.

## 2012-12-23 ENCOUNTER — Encounter: Payer: Self-pay | Admitting: Sports Medicine

## 2012-12-23 ENCOUNTER — Ambulatory Visit (INDEPENDENT_AMBULATORY_CARE_PROVIDER_SITE_OTHER): Payer: MEDICARE | Admitting: Sports Medicine

## 2012-12-23 VITALS — BP 106/53 | HR 66 | Wt 131.0 lb

## 2012-12-23 DIAGNOSIS — M7020 Olecranon bursitis, unspecified elbow: Secondary | ICD-10-CM

## 2012-12-23 DIAGNOSIS — M7021 Olecranon bursitis, right elbow: Secondary | ICD-10-CM

## 2012-12-23 NOTE — Assessment & Plan Note (Signed)
Wound looks good, no signs of worsening infection. I trimmed approximately 3 inches of packing and then redressed the wound. He will continue his oral antibiotic, as well as twice a day dressing changes. I will see him back next week.

## 2012-12-23 NOTE — Progress Notes (Signed)
  Subjective: Stanley Meyers comes back to recheck the wound on his right elbow. He had a history of olecranon bursitis that was treated 3 months ago with incision, drainage, packing, and antibiotics. He did extremely well but more recently has noted increasing drainage and lack of healing of the prior incision and drainage site. I performed a repeat incision and drainage, more extensive this time with packing yesterday. He returns with concerns about the wound. He has no pain.   Objective: General: Well-developed, well-nourished, and in no acute distress. Was inspected, it looks good, is clean, dry, and intact, no purulence, minimal erythema, packing is still in place.  Assessment/plan:

## 2012-12-27 ENCOUNTER — Ambulatory Visit (INDEPENDENT_AMBULATORY_CARE_PROVIDER_SITE_OTHER): Payer: MEDICARE | Admitting: Sports Medicine

## 2012-12-27 ENCOUNTER — Encounter: Payer: Self-pay | Admitting: Sports Medicine

## 2012-12-27 VITALS — BP 115/60 | HR 64 | Wt 127.0 lb

## 2012-12-27 DIAGNOSIS — M7021 Olecranon bursitis, right elbow: Secondary | ICD-10-CM

## 2012-12-27 NOTE — Assessment & Plan Note (Signed)
This is becoming chronic. We need a prolonged period with packing in place. I reapplied the packing, if it falls out within the next week and a half his wife will reapply approximately 2 inches of packing in the wound. Come back to see me in one and half to 2 weeks.

## 2012-12-27 NOTE — Progress Notes (Signed)
   Status post right olecranon bursa incision and drainage 7 days ago, packing fell out. Erythema has improved, essentially painless. Finished Septra.

## 2013-01-12 ENCOUNTER — Encounter: Payer: Self-pay | Admitting: Sports Medicine

## 2013-01-12 ENCOUNTER — Ambulatory Visit (INDEPENDENT_AMBULATORY_CARE_PROVIDER_SITE_OTHER): Payer: MEDICARE | Admitting: Sports Medicine

## 2013-01-12 VITALS — BP 101/52 | HR 60 | Wt 128.0 lb

## 2013-01-12 DIAGNOSIS — M7021 Olecranon bursitis, right elbow: Secondary | ICD-10-CM

## 2013-01-12 DIAGNOSIS — M7020 Olecranon bursitis, unspecified elbow: Secondary | ICD-10-CM

## 2013-01-12 NOTE — Assessment & Plan Note (Signed)
Postop day 22, packing is now out, clean, dry, intact, no drainage. Return in one month, if continues to drain and bother him, we could certainly do a surgical referral.

## 2013-01-12 NOTE — Progress Notes (Signed)
  Subjective:    CC: Followup  HPI: Chronic right olecranon bursitis: Stanley Meyers is now postop day 22 from incision, drainage, and packing of his right olecranon bursa. The packing has now fallen out completely, he has no pain, no swelling, no redness, the incision is still slightly open but appears to be granulating from deep within.  Past medical history, Surgical history, Family history not pertinant except as noted below, Social history, Allergies, and medications have been entered into the medical record, reviewed, and no changes needed.   Review of Systems: No fevers, chills, night sweats, weight loss, chest pain, or shortness of breath.   Objective:    General: Well Developed, well nourished, and in no acute distress.  Neuro: Alert and oriented x3, extra-ocular muscles intact, sensation grossly intact.  HEENT: Normocephalic, atraumatic, pupils equal round reactive to light, neck supple, no masses, no lymphadenopathy, thyroid nonpalpable.  Skin: Warm and dry, no rashes. Cardiac: Regular rate and rhythm, no murmurs rubs or gallops, no lower extremity edema.  Respiratory: Clear to auscultation bilaterally. Not using accessory muscles, speaking in full sentences. Right elbow: Olecranon shows no surrounding swelling, erythema, incision is still open but granulating from deep within.  Impression and Recommendations:

## 2013-01-27 ENCOUNTER — Other Ambulatory Visit: Payer: Self-pay | Admitting: Dermatology

## 2013-02-15 ENCOUNTER — Encounter: Payer: Self-pay | Admitting: Sports Medicine

## 2013-02-15 ENCOUNTER — Other Ambulatory Visit: Payer: Self-pay | Admitting: *Deleted

## 2013-02-15 ENCOUNTER — Ambulatory Visit (INDEPENDENT_AMBULATORY_CARE_PROVIDER_SITE_OTHER): Payer: MEDICARE | Admitting: Sports Medicine

## 2013-02-15 VITALS — BP 110/59 | HR 66 | Wt 128.0 lb

## 2013-02-15 DIAGNOSIS — M7021 Olecranon bursitis, right elbow: Secondary | ICD-10-CM

## 2013-02-15 DIAGNOSIS — I10 Essential (primary) hypertension: Secondary | ICD-10-CM

## 2013-02-15 DIAGNOSIS — M7020 Olecranon bursitis, unspecified elbow: Secondary | ICD-10-CM

## 2013-02-15 MED ORDER — AMLODIPINE BESYLATE 5 MG PO TABS: 5.0000 mg | ORAL_TABLET | Freq: Every day | ORAL | Status: DC

## 2013-02-15 NOTE — Progress Notes (Signed)
  Subjective:    CC: Followup  HPI: Stanley Meyers returns, he's had a three-month history of a septic right olecranon bursitis. He went through multiple antibiotics, and I subsequently performed an incision and drainage with packing. The packing was removed long ago, unfortunately he has continued to have a small amount of drainage, without pain.  Past medical history, Surgical history, Family history not pertinant except as noted below, Social history, Allergies, and medications have been entered into the medical record, reviewed, and no changes needed.   Review of Systems: No fevers, chills, night sweats, weight loss, chest pain, or shortness of breath.   Objective:    General: Well Developed, well nourished, and in no acute distress.  Neuro: Alert and oriented x3, extra-ocular muscles intact, sensation grossly intact.  HEENT: Normocephalic, atraumatic, pupils equal round reactive to light, neck supple, no masses, no lymphadenopathy, thyroid nonpalpable.  Skin: Warm and dry, no rashes. Cardiac: Regular rate and rhythm, no murmurs rubs or gallops, no lower extremity edema.  Respiratory: Clear to auscultation bilaterally. Not using accessory muscles, speaking in full sentences. Right elbow: There is essentially no erythema, there is still some fullness over the olecranon bursa, the incision remains open, and there continues to be a very small amount of seropurulent drainage.  Impression and Recommendations:

## 2013-02-15 NOTE — Assessment & Plan Note (Signed)
There has now been persistent drainage without pain after incision and drainage of the right olecranon bursa. He has had at least 2 months of antibiotics. At this point I would like him to see Dr. Jodi Geralds with orthopedic surgery for consideration of olecranon bursectomy.

## 2013-02-22 ENCOUNTER — Other Ambulatory Visit: Payer: Self-pay | Admitting: *Deleted

## 2013-02-22 DIAGNOSIS — I10 Essential (primary) hypertension: Secondary | ICD-10-CM

## 2013-02-22 MED ORDER — AMLODIPINE BESYLATE 5 MG PO TABS: 5.0000 mg | ORAL_TABLET | Freq: Every day | ORAL | Status: DC

## 2013-03-16 ENCOUNTER — Telehealth: Payer: Self-pay | Admitting: *Deleted

## 2013-03-16 NOTE — Telephone Encounter (Signed)
Pt's wife states that pt went this am to have surgery on his elbow and they done en EKG. They cancelled surgery due to EKG showing a Lt Bundle branch Block and informed them to contact you in regards of going to cardiology to get clearance before they will do surgery.  Please advise.  [name], LPN

## 2013-03-16 NOTE — Telephone Encounter (Signed)
Called [facility] to get copy of EKG from this am. Waiting on return call from surgery coordinator.  [name], LPN

## 2013-03-16 NOTE — Telephone Encounter (Signed)
I did go back and look at old EKG from 2009. He did not have left bundle branch block at that time. He's not currently symptomatic. We do recommend referral to cardiology for surgical clearance. Will try to call and see Dr. Olga Millers have any availability. We'll fax over or EKG from 2009 which is in the old [employer] centricity system.

## 2013-03-17 NOTE — Telephone Encounter (Signed)
Spoke with Darl Pikes at Dr. Luiz Blare office [phone]) and she is going to get the EKG faxed to [place].  [name], LPN

## 2013-03-17 NOTE — Telephone Encounter (Signed)
LM for Med Rec at [facility] to obtain copy of EKG.  [name], LPN

## 2013-03-18 ENCOUNTER — Other Ambulatory Visit: Payer: Self-pay | Admitting: Family Medicine

## 2013-03-21 ENCOUNTER — Other Ambulatory Visit: Payer: Self-pay

## 2013-03-21 MED ORDER — LOVASTATIN 20 MG PO TABS: 20.0000 mg | ORAL_TABLET | Freq: Every day | ORAL | Status: DC

## 2013-03-28 ENCOUNTER — Ambulatory Visit (INDEPENDENT_AMBULATORY_CARE_PROVIDER_SITE_OTHER): Payer: MEDICARE | Admitting: Sports Medicine

## 2013-03-28 ENCOUNTER — Encounter (INDEPENDENT_AMBULATORY_CARE_PROVIDER_SITE_OTHER): Payer: Self-pay

## 2013-03-28 ENCOUNTER — Encounter: Payer: Self-pay | Admitting: Sports Medicine

## 2013-03-28 VITALS — BP 120/62 | HR 61 | Wt 128.0 lb

## 2013-03-28 DIAGNOSIS — Z23 Encounter for immunization: Secondary | ICD-10-CM

## 2013-03-28 DIAGNOSIS — M7020 Olecranon bursitis, unspecified elbow: Secondary | ICD-10-CM

## 2013-03-28 DIAGNOSIS — M7021 Olecranon bursitis, right elbow: Secondary | ICD-10-CM

## 2013-03-28 NOTE — Progress Notes (Signed)
  Subjective:    CC: Recheck olecranon bursa   HPI: This is a very pleasant 79 year old male, he has chronic right olecranon bursitis, he was actually in the operating room with anesthesia noted a left bundle branch block and surgery was canceled. Since then he was seen by his cardiologist who cleared him for operative intervention. He returns to see me just to recheck the wound, and essentially reassure him that surgery is a good option.  Past medical history, Surgical history, Family history not pertinant except as noted below, Social history, Allergies, and medications have been entered into the medical record, reviewed, and no changes needed.   Review of Systems: No fevers, chills, night sweats, weight loss, chest pain, or shortness of breath.   Objective:    General: Well Developed, well nourished, and in no acute distress.  Neuro: Alert and oriented x3, extra-ocular muscles intact, sensation grossly intact.  HEENT: Normocephalic, atraumatic, pupils equal round reactive to light, neck supple, no masses, no lymphadenopathy, thyroid nonpalpable.  Skin: Warm and dry, no rashes. Cardiac: Regular rate and rhythm, no murmurs rubs or gallops, no lower extremity edema.  Respiratory: Clear to auscultation bilaterally. Not using accessory muscles, speaking in full sentences. Right elbow: Olecranon bursa is still mildly swollen, really not erythematous, I am able to express some mild purulence with pressure.  Impression and Recommendations:

## 2013-03-28 NOTE — Assessment & Plan Note (Signed)
Unfortunately surgery was recently canceled due to a left bundle branch block on EKG in the operating room. He has already seen the cardiologist who has cleared him for intermediate risk noncardiac surgery, based on his history today, he has at least 4 METS of exercise tolerance. I think is a good candidate and should proceed. He can return to see me on an as-needed basis.

## 2013-04-11 ENCOUNTER — Encounter: Payer: Self-pay | Admitting: Family Medicine

## 2013-04-14 ENCOUNTER — Encounter: Payer: Self-pay | Admitting: Family Medicine

## 2013-04-14 ENCOUNTER — Ambulatory Visit (INDEPENDENT_AMBULATORY_CARE_PROVIDER_SITE_OTHER): Payer: MEDICARE | Admitting: Family Medicine

## 2013-04-14 VITALS — BP 109/56 | HR 59 | Temp 97.5°F | Wt 126.0 lb

## 2013-04-14 DIAGNOSIS — J449 Chronic obstructive pulmonary disease, unspecified: Secondary | ICD-10-CM

## 2013-04-14 DIAGNOSIS — I679 Cerebrovascular disease, unspecified: Secondary | ICD-10-CM

## 2013-04-14 DIAGNOSIS — F0150 Vascular dementia without behavioral disturbance: Secondary | ICD-10-CM

## 2013-04-14 DIAGNOSIS — J4489 Other specified chronic obstructive pulmonary disease: Secondary | ICD-10-CM

## 2013-04-14 DIAGNOSIS — H6120 Impacted cerumen, unspecified ear: Secondary | ICD-10-CM

## 2013-04-14 DIAGNOSIS — J42 Unspecified chronic bronchitis: Secondary | ICD-10-CM

## 2013-04-14 DIAGNOSIS — H6121 Impacted cerumen, right ear: Secondary | ICD-10-CM

## 2013-04-14 DIAGNOSIS — I6789 Other cerebrovascular disease: Secondary | ICD-10-CM | POA: Insufficient documentation

## 2013-04-14 MED ORDER — DONEPEZIL HCL 5 MG PO TABS: 5.0000 mg | ORAL_TABLET | Freq: Every day | ORAL | Status: DC

## 2013-04-14 NOTE — Progress Notes (Signed)
Subjective:    Patient ID: Stanley Meyers, male    DOB: 06-01-1925, 79 y.o.   MRN: [id number]  HPI Cough - pt has been coughing up a lot of thick white mucus was taing mucinex stopped taking because she felt like it was making his congestion worse instead of helping to dry it out.  This is been going on for months without any significant change.Hx of COPD. Has been going on for months.  He has not had any significant nasal congestion, fevers or signs of sinusitis. There has been no increase in sputum production or change in color of sputum or fevers or chills or sweats or fatigue. He just says that the sputum is in copious amounts of the point where he feels like sometimes he is gagging and drowning.  Memory - his wife has noticed declining memory over the last year. He has not been able to balance a checkbook and she's been taking care of the finances for about a year. She is also not allow him to drive for him to see because she's been worried about his safety. He has been a little bit pressured at times because he feels like the routine home has changed but his wife says it has not. He is definitely having problems with more short short-term memory. The progression has been gradual and not abrupt. He and his wife deny any changes as far as mood or personality changes. He has had more falls in the last year but has been a little bit better after physical therapy. He does have hypertension, hyperlipidemia, and radiates, stage IV kidney disease, COPD.  His wife would like me to check his ears today. She thinks they may be blocked again.  He is planning on having cataract surgery in January. He needs Both cataracts done. Review of Systems     Objective:   Physical Exam  Constitutional: He is oriented to person, place, and time. He appears well-developed and well-nourished.  HENT:  Head: Normocephalic and atraumatic.  Right canal blocked with cerumen  Neurological: He is alert and oriented to  person, place, and time.  Skin: Skin is warm and dry.  Psychiatric: He has a normal mood and affect. His behavior is normal.          Assessment & Plan:  COPD/chronic bronchitis-I. gave him reassurance of the chronic cough and sputum production is very normal in patients who have more chronic bronchitis type of COPD. He needs to monitor carefully for any changes in symptoms such as change in sputum production or color, fevers chills or fatigue, shortness of breath or wheezing. Those are signs that he could be getting an infection or having a COPD exacerbation. I will try to speak with one of her pulmonologist to see if they have any thoughts or recommendations for things that may help with the excess sputum production besides Mucinex. Next  Memory loss-consider variable causes including vascular since he does have a history of hypertension hyperlipidemia and red nodes versus Alzheimer's dementia. We'll perform a Mini-Mental status exam today. His symptoms have been gradual and have not been abrupt which is also reassuring. A major change in mood. His wife says in fact he is very positive and is happy most of the time. Things like brain tumor etc. are less likely since he has not had any major neurologic changes. Will check RPR, B12, thyroid, et Karie Soda. We certainly could test for syphilis and HIV but they declined. I think this is very  unlikely. We discussed potential use of medications for memory loss the mild benefit as far as helping maintain his current level of functioning. Though I did explain to them that it does not reverse any memory loss. We discussed potential side effects that can be more common. They are interested in moving forward with a prescriptive for Aricept. 5 mg x6 weeks. Followup in 6 weeks if she's tolerating it well we'll increase to 10 mg at bedtime.  Cerumen impaction-irrigation of right ear performed today. Patient tolerated well.  Time spent 30 min w/ >50% of time spent  counselnig about chronic bronchitis and memory loss.

## 2013-04-18 ENCOUNTER — Other Ambulatory Visit: Payer: Self-pay | Admitting: Family Medicine

## 2013-05-16 ENCOUNTER — Other Ambulatory Visit: Payer: Self-pay | Admitting: *Deleted

## 2013-05-16 DIAGNOSIS — R609 Edema, unspecified: Secondary | ICD-10-CM

## 2013-05-16 MED ORDER — FUROSEMIDE 20 MG PO TABS: 20.0000 mg | ORAL_TABLET | Freq: Every day | ORAL | Status: DC

## 2013-05-23 ENCOUNTER — Other Ambulatory Visit: Payer: Self-pay | Admitting: Family Medicine

## 2013-06-20 ENCOUNTER — Other Ambulatory Visit: Payer: Self-pay | Admitting: Family Medicine

## 2013-06-21 ENCOUNTER — Other Ambulatory Visit: Payer: Self-pay | Admitting: *Deleted

## 2013-06-21 MED ORDER — CALCITRIOL 0.25 MCG PO CAPS: 0.2500 ug | ORAL_CAPSULE | ORAL | Status: DC

## 2013-06-21 NOTE — Telephone Encounter (Signed)
Pt's wife called in for med refill. rx sent to [facility].Audelia Hives [place]

## 2013-06-22 ENCOUNTER — Other Ambulatory Visit: Payer: Self-pay | Admitting: Family Medicine

## 2013-07-21 ENCOUNTER — Ambulatory Visit: Payer: MEDICARE | Admitting: Family Medicine

## 2013-07-22 ENCOUNTER — Ambulatory Visit (INDEPENDENT_AMBULATORY_CARE_PROVIDER_SITE_OTHER): Payer: MEDICARE

## 2013-07-22 ENCOUNTER — Encounter: Payer: Self-pay | Admitting: Physician Assistant

## 2013-07-22 ENCOUNTER — Ambulatory Visit (INDEPENDENT_AMBULATORY_CARE_PROVIDER_SITE_OTHER): Payer: MEDICARE | Admitting: Physician Assistant

## 2013-07-22 VITALS — BP 97/47 | HR 67 | Wt 126.0 lb

## 2013-07-22 DIAGNOSIS — R6889 Other general symptoms and signs: Secondary | ICD-10-CM

## 2013-07-22 DIAGNOSIS — R059 Cough, unspecified: Secondary | ICD-10-CM

## 2013-07-22 DIAGNOSIS — R05 Cough: Secondary | ICD-10-CM

## 2013-07-22 DIAGNOSIS — R0600 Dyspnea, unspecified: Secondary | ICD-10-CM

## 2013-07-22 DIAGNOSIS — R0989 Other specified symptoms and signs involving the circulatory and respiratory systems: Secondary | ICD-10-CM

## 2013-07-22 DIAGNOSIS — R0609 Other forms of dyspnea: Secondary | ICD-10-CM

## 2013-07-22 NOTE — Progress Notes (Signed)
   Subjective:    Patient ID: Stanley Meyers, male    DOB: 10/29/1924, 80 y.o.   MRN: [id number]  HPI Pt is a 80 yo male who presents with his wife to discuss ongoing congestion. He has COPD. Pt reports he has had ongoing congestion for many years. He used to take mucinex D on a daily basis. Then he found out that he had severe kidney disease and could not take OTC medications any more. He sees nephrologist yearly. His cough is productive but clear sputum. Stable today. Not worsening. Takes tudorza daily but reports does not notice any difference. occasionally will use albuterol inhaler but also does not notice any difference. Denies any sinus pressure He does report to sleep on 2 pillows at night because worse at night. Cough is worse at night. No wheezing. No fever, chills, n/v/d. ST, ear pain, sinus pressure. Pt is stable but tired of coughing.   Review of Systems     Objective:   Physical Exam  Constitutional: He is oriented to person, place, and time. He appears well-developed and well-nourished.  HENT:  Head: Normocephalic and atraumatic.  Right Ear: External ear normal.  Left Ear: External ear normal.  Nose: Nose normal.  Mouth/Throat: Oropharynx is clear and moist.  Eyes: Conjunctivae are normal.  Neck: Normal range of motion. Neck supple. No JVD present.  Cardiovascular: Normal rate, regular rhythm and normal heart sounds.   Pulmonary/Chest: Effort normal and breath sounds normal. He has no wheezes. He has no rales.  Lymphadenopathy:    He has no cervical adenopathy.  Neurological: He is alert and oriented to person, place, and time.  Skin: Skin is dry.  Psychiatric: He has a normal mood and affect. His behavior is normal.          Assessment & Plan:  PND/COPD/cough/congestion- not able to get pulse ox due to raynauds  concerned about CHF and fluid build up will get BNP and CXR. NO physical exams signs of CHF, no swelling or JVD. Consider cough due to  acid reflux but pt  on pepcid and protonix daily. Pt reports no beneift on Tudorza for COPD and does not use or get benefit from albuterol. Due to kidney disease limited on what he can take for congestion and chronic coughing. Will consider calling nephrologist for congestion medication if CHF labs are negative.

## 2013-07-23 LAB — BRAIN NATRIURETIC PEPTIDE: Brain Natriuretic Peptide: 108.6 pg/mL — ABNORMAL HIGH (ref 0.0–100.0)

## 2013-07-25 ENCOUNTER — Telehealth: Payer: Self-pay | Admitting: Physician Assistant

## 2013-07-25 NOTE — Telephone Encounter (Signed)
Will you call nephrologist and see what they suggest pt to take OTC for congestion and cough with severe kidney disease. Mucinex D helped but stopped when found out had kidney disease.

## 2013-07-26 NOTE — Telephone Encounter (Signed)
Left message on Dr. Allean Found assistant's vm asking for a return call.

## 2013-08-05 ENCOUNTER — Other Ambulatory Visit: Payer: Self-pay | Admitting: Family Medicine

## 2013-08-15 ENCOUNTER — Other Ambulatory Visit: Payer: Self-pay | Admitting: Family Medicine

## 2013-09-06 ENCOUNTER — Other Ambulatory Visit: Payer: Self-pay | Admitting: Family Medicine

## 2013-09-20 ENCOUNTER — Encounter: Payer: Self-pay | Admitting: Sports Medicine

## 2013-09-20 ENCOUNTER — Ambulatory Visit (INDEPENDENT_AMBULATORY_CARE_PROVIDER_SITE_OTHER): Payer: MEDICARE | Admitting: Sports Medicine

## 2013-09-20 VITALS — BP 93/51 | HR 60 | Ht 67.0 in | Wt 130.0 lb

## 2013-09-20 DIAGNOSIS — M5137 Other intervertebral disc degeneration, lumbosacral region: Secondary | ICD-10-CM

## 2013-09-20 DIAGNOSIS — M5136 Other intervertebral disc degeneration, lumbar region: Secondary | ICD-10-CM | POA: Insufficient documentation

## 2013-09-20 DIAGNOSIS — M51379 Other intervertebral disc degeneration, lumbosacral region without mention of lumbar back pain or lower extremity pain: Secondary | ICD-10-CM

## 2013-09-20 DIAGNOSIS — M51369 Other intervertebral disc degeneration, lumbar region without mention of lumbar back pain or lower extremity pain: Secondary | ICD-10-CM | POA: Insufficient documentation

## 2013-09-20 MED ORDER — PREDNISONE 50 MG PO TABS: ORAL_TABLET | ORAL | Status: DC

## 2013-09-20 MED ORDER — GABAPENTIN 100 MG PO CAPS: ORAL_CAPSULE | ORAL | Status: DC

## 2013-09-20 NOTE — Assessment & Plan Note (Signed)
With left-sided radicular symptoms at L5 versus an S1 distribution. Considering renal insufficiency we cannot use NSAIDs. Prednisone, low-dose gabapentin, formal physical therapy. Return in one month, MRI for interventional injection planning no better.

## 2013-09-20 NOTE — Patient Instructions (Signed)

## 2013-09-20 NOTE — Progress Notes (Signed)
  Subjective:    CC: Left leg pain.  HPI: Left leg pain: Present for several months, starts from the back and radiates down the left leg in an L5 versus an S1 distribution, moderate, persistent, no bowel or bladder dysfunction, no constitutional symptoms.  Chronic right olecranon bursitis: Continues to have a scab, there is occasional drainage, this has been I&D multiple times by me, as well as by the orthopedic surgeon.  Past medical history, Surgical history, Family history not pertinant except as noted below, Social history, Allergies, and medications have been entered into the medical record, reviewed, and no changes needed.   Review of Systems: No fevers, chills, night sweats, weight loss, chest pain, or shortness of breath.   Objective:    General: Well Developed, well nourished, and in no acute distress.  Neuro: Alert and oriented x3, extra-ocular muscles intact, sensation grossly intact.  HEENT: Normocephalic, atraumatic, pupils equal round reactive to light, neck supple, no masses, no lymphadenopathy, thyroid nonpalpable.  Skin: Warm and dry, no rashes. Cardiac: Regular rate and rhythm, no murmurs rubs or gallops, no lower extremity edema.  Respiratory: Clear to auscultation bilaterally. Not using accessory muscles, speaking in full sentences.  Impression and Recommendations:

## 2013-10-10 ENCOUNTER — Other Ambulatory Visit: Payer: Self-pay | Admitting: Family Medicine

## 2013-10-17 ENCOUNTER — Telehealth: Payer: Self-pay | Admitting: *Deleted

## 2013-10-17 ENCOUNTER — Encounter: Payer: Self-pay | Admitting: Sports Medicine

## 2013-10-17 ENCOUNTER — Ambulatory Visit (INDEPENDENT_AMBULATORY_CARE_PROVIDER_SITE_OTHER): Payer: MEDICARE | Admitting: Sports Medicine

## 2013-10-17 VITALS — BP 111/59 | HR 69 | Ht 67.0 in | Wt 134.0 lb

## 2013-10-17 DIAGNOSIS — M5136 Other intervertebral disc degeneration, lumbar region: Secondary | ICD-10-CM

## 2013-10-17 DIAGNOSIS — M51369 Other intervertebral disc degeneration, lumbar region without mention of lumbar back pain or lower extremity pain: Secondary | ICD-10-CM

## 2013-10-17 DIAGNOSIS — M51379 Other intervertebral disc degeneration, lumbosacral region without mention of lumbar back pain or lower extremity pain: Secondary | ICD-10-CM

## 2013-10-17 DIAGNOSIS — M5137 Other intervertebral disc degeneration, lumbosacral region: Secondary | ICD-10-CM

## 2013-10-17 NOTE — Progress Notes (Signed)
  Subjective:    CC: Followup  HPI: Lumbar radiculopathy : improved only slightly with physical therapy, steroids, gabapentin. He is enjoying physical therapy and does desire to continue. He is however also aware that he may need to proceed to intervention. Symptoms are moderate, persistent, and radiate down the left leg in an S1 distribution.  Past medical history, Surgical history, Family history not pertinant except as noted below, Social history, Allergies, and medications have been entered into the medical record, reviewed, and no changes needed.   Review of Systems: No fevers, chills, night sweats, weight loss, chest pain, or shortness of breath.   Objective:    General: Well Developed, well nourished, and in no acute distress.  Neuro: Alert and oriented x3, extra-ocular muscles intact, sensation grossly intact.  HEENT: Normocephalic, atraumatic, pupils equal round reactive to light, neck supple, no masses, no lymphadenopathy, thyroid nonpalpable.  Skin: Warm and dry, no rashes. Cardiac: Regular rate and rhythm, no murmurs rubs or gallops, no lower extremity edema.  Respiratory: Clear to auscultation bilaterally. Not using accessory muscles, speaking in full sentences.  Impression and Recommendations:

## 2013-10-17 NOTE — Telephone Encounter (Signed)
PA obtained for MRI Lumbar spine w/o contrast. Auth # [id number].  Oscar La, LPN

## 2013-10-17 NOTE — Assessment & Plan Note (Signed)
With left-sided S1 radicular symptoms. Doing okay with gabapentin. He is a little better, does desire more physical therapy, we are still going to proceed with an MRI for interventional injection planning.

## 2013-10-18 ENCOUNTER — Other Ambulatory Visit: Payer: Self-pay | Admitting: Family Medicine

## 2013-10-21 ENCOUNTER — Ambulatory Visit (INDEPENDENT_AMBULATORY_CARE_PROVIDER_SITE_OTHER): Payer: MEDICARE | Admitting: Family Medicine

## 2013-10-21 ENCOUNTER — Encounter: Payer: Self-pay | Admitting: Family Medicine

## 2013-10-21 VITALS — BP 129/60 | HR 60 | Wt 135.0 lb

## 2013-10-21 DIAGNOSIS — N184 Chronic kidney disease, stage 4 (severe): Secondary | ICD-10-CM

## 2013-10-21 DIAGNOSIS — R609 Edema, unspecified: Secondary | ICD-10-CM

## 2013-10-21 DIAGNOSIS — H6120 Impacted cerumen, unspecified ear: Secondary | ICD-10-CM

## 2013-10-21 DIAGNOSIS — I447 Left bundle-branch block, unspecified: Secondary | ICD-10-CM

## 2013-10-21 NOTE — Progress Notes (Signed)
Subjective:    Patient ID: Stanley Meyers, male    DOB: December 26, 1924, 80 y.o.   MRN: [id number]  HPI Here today for lower extremity edema. His wife is here with him today. She started noticing the swelling about a week ago. He tends to have chronic swelling in his right foot but not usually in the left. She also thinks that his weight is increasing. He denies any chest pain or shortness of breath. He denies any abdominal pain or dysuria. He denies any fevers chills or sweats or increase in back pain. He does have some chronic back pain, he is followed by Dr. Dianah Field and affect is scheduled for an MRI for further evaluation, and possible epidural he has known stage IV chronic kidney disease and is followed by Dr. Jamal Maes, nephrology.  Review of Systems  BP 129/60  Pulse 60  Wt 135 lb (61.236 kg)    No Known Allergies  Past Medical History  Diagnosis Date  . Kidney failure   . GERD (gastroesophageal reflux disease)   . Hypertension   . Hyperlipemia   . Spasmatic colon   . Raynaud's disease     Past Surgical History  Procedure Laterality Date  . Cholecystectomy    . Hernia repair    . Av fistula placement      History   Social History  . Marital Status: Married    Spouse Name: N/A    Number of Children: N/A  . Years of Education: N/A   Occupational History  . Not on file.   Social History Main Topics  . Smoking status: Former Research scientist (life sciences)  . Smokeless tobacco: Never Used  . Alcohol Use: 1.2 oz/week    2 Glasses of wine per week  . Drug Use: No  . Sexual Activity: Not on file   Other Topics Concern  . Not on file   Social History Narrative  . No narrative on file    No family history on file.  Outpatient Encounter Prescriptions as of 10/21/2013  Medication Sig  . acetaminophen (TYLENOL) 500 MG tablet Take 1,000 mg by mouth daily.   . Aclidinium Bromide (TUDORZA PRESSAIR) 400 MCG/ACT AEPB Inhale 1 puff into the lungs 2 (two) times daily.  Marland Kitchen albuterol  (PROAIR HFA) 108 (90 BASE) MCG/ACT inhaler Inhale 2 puffs into the lungs every 6 (six) hours as needed for wheezing or shortness of breath.  . AMBULATORY NON FORMULARY MEDICATION Medication Name: Shingles vaccine IM x 1  . amLODipine (NORVASC) 5 MG tablet Take 1 tablet (5 mg total) by mouth daily.  . calcitRIOL (ROCALTROL) 0.25 MCG capsule Take 1 capsule (0.25 mcg total) by mouth 2 (two) times a week. Take one tablet by mouth 2 times weekly  . dicyclomine (BENTYL) 10 MG capsule Take 5 mg by mouth 4 (four) times daily -  before meals and at bedtime.  . donepezil (ARICEPT) 5 MG tablet TAKE 1 TABLET (5 MG TOTAL) BY MOUTH AT BEDTIME.  . famotidine (PEPCID) 40 MG tablet   . furosemide (LASIX) 20 MG tablet Take 1 tablet (20 mg total) by mouth daily.  Marland Kitchen gabapentin (NEURONTIN) 100 MG capsule One tab PO qHS for a week, then BID for a week, then TID. May double weekly to a max of 3,600mg /day  . lovastatin (MEVACOR) 20 MG tablet Take 1 tablet (20 mg total) by mouth at bedtime.  . Multiple Vitamins-Minerals (MULTIVITAMIN PO) Take by mouth.  . pantoprazole (PROTONIX) 40 MG tablet Take 1 tablet (  40 mg total) by mouth daily.  . [DISCONTINUED] TUDORZA PRESSAIR 400 MCG/ACT AEPB INHALE 1 PUFF TWICE A DAY          Objective:   Physical Exam  Constitutional: He is oriented to person, place, and time. He appears well-developed and well-nourished.  HENT:  Head: Normocephalic and atraumatic.  Right Ear: External ear normal.  Left Ear: External ear normal.  Mouth/Throat: Oropharynx is clear and moist.  Left canal and TM is clear. Right canal is blocked by cerumen.   Cardiovascular: Normal rate, regular rhythm and normal heart sounds.   Left carotid bruit   Pulmonary/Chest: Effort normal and breath sounds normal.  Musculoskeletal:  1+ pitting edema in both feet bilaterally. The edema only extends a couple inches above the ankle bilaterally. Good capillary refill on the toes.  Neurological: He is alert and  oriented to person, place, and time.  Skin: Skin is warm and dry.  Psychiatric: He has a normal mood and affect. His behavior is normal.          Assessment & Plan:  Lower extremity edema-unclear etiology at this point. We'll do blood work to evaluate for thyroid changes, anemia, electrolyte disturbance. Also consider new onset congestive heart failure. He has a history of left bundle branch block. Will schedule for echocardiogram for further evaluation. In the meantime we'll increase his Lasix to 40 mg daily over the weekend. If this resolves the fluid and will go back down to history milligrams dose. If not then we'll need to recheck electrolytes next week. I would like him to weigh himself on his home scale when he gets back home today. Goal to lose 5 pounds by Monday.  Cerumen impaction right ear-irrigation performed. Patient tolerated well.

## 2013-10-21 NOTE — Patient Instructions (Signed)
Increase lasix to 40 mg on Saturday and Sunday or until loses 5 lbs on home scale Avoid high salt intake.   Limit fluid intake to 64 ounces daily.

## 2013-10-22 ENCOUNTER — Ambulatory Visit (HOSPITAL_BASED_OUTPATIENT_CLINIC_OR_DEPARTMENT_OTHER)
Admission: RE | Admit: 2013-10-22 | Discharge: 2013-10-22 | Disposition: A | Discharge status: Home / Self Care | Payer: MEDICARE | Source: Ambulatory Visit | Attending: Sports Medicine | Admitting: Sports Medicine

## 2013-10-22 DIAGNOSIS — M48061 Spinal stenosis, lumbar region without neurogenic claudication: Secondary | ICD-10-CM | POA: Insufficient documentation

## 2013-10-22 DIAGNOSIS — M5136 Other intervertebral disc degeneration, lumbar region: Secondary | ICD-10-CM

## 2013-10-22 LAB — COMPLETE METABOLIC PANEL WITH GFR
ALBUMIN: 3.9 g/dL (ref 3.5–5.2)
ALT: 15 U/L (ref 0–53)
AST: 23 U/L (ref 0–37)
Alkaline Phosphatase: 95 U/L (ref 39–117)
BUN: 40 mg/dL — AB (ref 6–23)
CHLORIDE: 104 meq/L (ref 96–112)
CO2: 28 mEq/L (ref 19–32)
CREATININE: 2.3 mg/dL — AB (ref 0.50–1.35)
Calcium: 9.8 mg/dL (ref 8.4–10.5)
GFR, EST AFRICAN AMERICAN: 29 mL/min — AB
GFR, Est Non African American: 25 mL/min — ABNORMAL LOW
Glucose, Bld: 97 mg/dL (ref 70–99)
POTASSIUM: 4.5 meq/L (ref 3.5–5.3)
Sodium: 140 mEq/L (ref 135–145)
Total Bilirubin: 0.5 mg/dL (ref 0.2–1.2)
Total Protein: 6 g/dL (ref 6.0–8.3)

## 2013-10-22 LAB — CBC WITH DIFFERENTIAL/PLATELET
BASOS ABS: 0.1 10*3/uL (ref 0.0–0.1)
BASOS PCT: 1 % (ref 0–1)
EOS PCT: 1 % (ref 0–5)
Eosinophils Absolute: 0.1 10*3/uL (ref 0.0–0.7)
HEMATOCRIT: 32.3 % — AB (ref 39.0–52.0)
Hemoglobin: 10.6 g/dL — ABNORMAL LOW (ref 13.0–17.0)
Lymphocytes Relative: 32 % (ref 12–46)
Lymphs Abs: 1.6 10*3/uL (ref 0.7–4.0)
MCH: 29.4 pg (ref 26.0–34.0)
MCHC: 32.8 g/dL (ref 30.0–36.0)
MCV: 89.7 fL (ref 78.0–100.0)
MONO ABS: 0.6 10*3/uL (ref 0.1–1.0)
Monocytes Relative: 12 % (ref 3–12)
Neutro Abs: 2.8 10*3/uL (ref 1.7–7.7)
Neutrophils Relative %: 54 % (ref 43–77)
PLATELETS: 146 10*3/uL — AB (ref 150–400)
RBC: 3.6 MIL/uL — ABNORMAL LOW (ref 4.22–5.81)
RDW: 15.5 % (ref 11.5–15.5)
WBC: 5.1 10*3/uL (ref 4.0–10.5)

## 2013-10-22 LAB — TSH: TSH: 1.35 u[IU]/mL (ref 0.350–4.500)

## 2013-10-22 LAB — BRAIN NATRIURETIC PEPTIDE: Brain Natriuretic Peptide: 279.6 pg/mL — ABNORMAL HIGH (ref 0.0–100.0)

## 2013-10-26 LAB — POCT URINALYSIS DIPSTICK
Bilirubin, UA: NEGATIVE
Blood, UA: NEGATIVE
Glucose, UA: NEGATIVE
Ketones, UA: NEGATIVE
Leukocytes, UA: NEGATIVE
Nitrite, UA: NEGATIVE
PH UA: 6
PROTEIN UA: NEGATIVE
SPEC GRAV UA: 1.015
Urobilinogen, UA: 0.2

## 2013-10-27 ENCOUNTER — Encounter: Payer: Self-pay | Admitting: Sports Medicine

## 2013-10-27 ENCOUNTER — Ambulatory Visit (INDEPENDENT_AMBULATORY_CARE_PROVIDER_SITE_OTHER): Payer: MEDICARE | Admitting: Sports Medicine

## 2013-10-27 VITALS — BP 116/56 | HR 62 | Ht 67.0 in | Wt 134.0 lb

## 2013-10-27 DIAGNOSIS — M51369 Other intervertebral disc degeneration, lumbar region without mention of lumbar back pain or lower extremity pain: Secondary | ICD-10-CM

## 2013-10-27 DIAGNOSIS — M5136 Other intervertebral disc degeneration, lumbar region: Secondary | ICD-10-CM

## 2013-10-27 DIAGNOSIS — M51379 Other intervertebral disc degeneration, lumbosacral region without mention of lumbar back pain or lower extremity pain: Secondary | ICD-10-CM

## 2013-10-27 DIAGNOSIS — M5137 Other intervertebral disc degeneration, lumbosacral region: Secondary | ICD-10-CM

## 2013-10-27 MED ORDER — HYDROCODONE-ACETAMINOPHEN 5-325 MG PO TABS: 1.0000 | ORAL_TABLET | Freq: Three times a day (TID) | ORAL | Status: DC | PRN

## 2013-10-27 NOTE — Progress Notes (Signed)
  Subjective:    CC: MRI results  HPI: Oziah returns, he continues to have left-sided back pain with radicular symptoms in an S1 predominantly but possibly an L5 distribution. He tells me that his radicular symptoms are worst in his axial symptoms. He has been through physical therapy, steroids, NSAIDs, gabapentin. Symptoms are persistent, moderate to severe.  Past medical history, Surgical history, Family history not pertinant except as noted below, Social history, Allergies, and medications have been entered into the medical record, reviewed, and no changes needed.   Review of Systems: No fevers, chills, night sweats, weight loss, chest pain, or shortness of breath.   Objective:    General: Well Developed, well nourished, and in no acute distress.  Neuro: Alert and oriented x3, extra-ocular muscles intact, sensation grossly intact.  HEENT: Normocephalic, atraumatic, pupils equal round reactive to light, neck supple, no masses, no lymphadenopathy, thyroid nonpalpable.  Skin: Warm and dry, no rashes. Cardiac: Regular rate and rhythm, no murmurs rubs or gallops, no lower extremity edema.  Respiratory: Clear to auscultation bilaterally. Not using accessory muscles, speaking in full sentences.  MRI was reviewed and shows multilevel disc protrusions with multilevel widespread facet spondylosis, he certainly has V8-L3, left-sided foraminal stenosis and lateral recess stenosis that could explain his symptoms.  Impression and Recommendations:

## 2013-10-27 NOTE — Assessment & Plan Note (Signed)
MRI does show multilevel degenerative disc disease with degenerative scoliosis with fairly widespead but minimal in severity facet arthrosis. Since symptoms are predominantly left-sided S1 versus L5 radicular, we are going to proceed with a left-sided L5-S1 interlaminar epidural injection. Certainly we should keep his facet joints as possible targets. Return to see me 2 weeks after her epidural. Small amount of hydrocodone provided.

## 2013-10-28 ENCOUNTER — Inpatient Hospital Stay: Admission: RE | Admit: 2013-10-28 | Payer: MEDICARE | Source: Ambulatory Visit

## 2013-10-31 ENCOUNTER — Telehealth: Payer: Self-pay | Admitting: *Deleted

## 2013-10-31 NOTE — Telephone Encounter (Signed)
Pt's wife called and stated that his weight came down however, it is back up to 135lbs. And he still has edema she wants to know what should be done now?Stanley Meyers

## 2013-10-31 NOTE — Telephone Encounter (Signed)
Take extra lasix tab for 2 days and then will need to check his potassium.

## 2013-10-31 NOTE — Telephone Encounter (Signed)
Patient's wife advised

## 2013-11-03 ENCOUNTER — Other Ambulatory Visit: Payer: Self-pay | Admitting: *Deleted

## 2013-11-03 DIAGNOSIS — I10 Essential (primary) hypertension: Secondary | ICD-10-CM

## 2013-11-03 LAB — POTASSIUM: Potassium: 4.1 mEq/L (ref 3.5–5.3)

## 2013-11-04 ENCOUNTER — Ambulatory Visit
Admission: RE | Admit: 2013-11-04 | Discharge: 2013-11-04 | Disposition: A | Discharge status: Home / Self Care | Payer: MEDICARE | Source: Ambulatory Visit | Attending: Sports Medicine | Admitting: Sports Medicine

## 2013-11-04 MED ORDER — IOHEXOL 180 MG/ML  SOLN
1.0000 mL | Freq: Once | INTRAMUSCULAR | Status: AC | PRN
  Administered 2013-11-04: 1 mL via EPIDURAL

## 2013-11-04 MED ORDER — METHYLPREDNISOLONE ACETATE 40 MG/ML INJ SUSP (RADIOLOG
120.0000 mg | Freq: Once | INTRAMUSCULAR | Status: AC
  Administered 2013-11-04: 120 mg via EPIDURAL

## 2013-11-04 NOTE — Discharge Instructions (Signed)

## 2013-11-04 NOTE — Progress Notes (Signed)
Quick Note:  All labs are normal. ______ 

## 2013-11-08 ENCOUNTER — Telehealth: Payer: Self-pay | Admitting: *Deleted

## 2013-11-08 NOTE — Telephone Encounter (Signed)
PA obtained for 2D echo w/o contrast. Auth # [id number]. Exp. 12/07/13.  Oscar La, LPN

## 2013-11-09 ENCOUNTER — Other Ambulatory Visit: Payer: Self-pay | Admitting: Family Medicine

## 2013-11-16 ENCOUNTER — Ambulatory Visit (HOSPITAL_BASED_OUTPATIENT_CLINIC_OR_DEPARTMENT_OTHER): Payer: MEDICARE

## 2013-11-18 ENCOUNTER — Ambulatory Visit (INDEPENDENT_AMBULATORY_CARE_PROVIDER_SITE_OTHER): Payer: MEDICARE | Admitting: Sports Medicine

## 2013-11-18 ENCOUNTER — Encounter: Payer: Self-pay | Admitting: Sports Medicine

## 2013-11-18 VITALS — BP 115/58 | HR 62 | Ht 67.0 in | Wt 128.0 lb

## 2013-11-18 DIAGNOSIS — M51369 Other intervertebral disc degeneration, lumbar region without mention of lumbar back pain or lower extremity pain: Secondary | ICD-10-CM

## 2013-11-18 DIAGNOSIS — M5137 Other intervertebral disc degeneration, lumbosacral region: Secondary | ICD-10-CM

## 2013-11-18 DIAGNOSIS — M51379 Other intervertebral disc degeneration, lumbosacral region without mention of lumbar back pain or lower extremity pain: Secondary | ICD-10-CM

## 2013-11-18 DIAGNOSIS — M5136 Other intervertebral disc degeneration, lumbar region: Secondary | ICD-10-CM

## 2013-11-18 NOTE — Assessment & Plan Note (Signed)
No response, not even temporary to an L5-S1 interlaminar epidural at this point we are going to proceed with a selective S1 nerve root block on the left side. I also think we need to try some facet joints, considering the widespread nature of his facet arthritis I do think we need to hit the L1-S1 facet joints on the left side.

## 2013-11-18 NOTE — Progress Notes (Signed)
  Subjective:    CC: Followup after epidural  HPI: Yvone Neu is a very pleasant 80 year old old male with chronic low back pain. He has predominantly radicular pain down the left leg and S1 nerve root distribution, we recently ordered an L5-S1 intralaminar epidural steroid injection which provided no relief, not even temporarily. He does have some pain left side that is predominantly axial worse with standing in extension, but also tells me that his radicular pain down the left leg in an S1 distribution is worst. He is taking Tylenol 650 mg 3 times a day. Unfortunately his quality of life has decreased significantly, he is unable to go on walks with his wife anymore.  Past medical history, Surgical history, Family history not pertinant except as noted below, Social history, Allergies, and medications have been entered into the medical record, reviewed, and no changes needed.   Review of Systems: No fevers, chills, night sweats, weight loss, chest pain, or shortness of breath.   Objective:    General: Well Developed, well nourished, and in no acute distress.  Neuro: Alert and oriented x3, extra-ocular muscles intact, sensation grossly intact.  HEENT: Normocephalic, atraumatic, pupils equal round reactive to light, neck supple, no masses, no lymphadenopathy, thyroid nonpalpable.  Skin: Warm and dry, no rashes. Cardiac: Regular rate and rhythm, no murmurs rubs or gallops, no lower extremity edema.  Respiratory: Clear to auscultation bilaterally. Not using accessory muscles, speaking in full sentences.  I personally reviewed his MRI scan again which showed multilevel degenerative disease with degenerative scoliosis and widespread facet arthritis. Nearly every neural foramen is blocked by disc, the results of widespread facet arthritis at nearly every level. blocked by disc  Impression and Recommendations:

## 2013-11-23 ENCOUNTER — Ambulatory Visit (HOSPITAL_BASED_OUTPATIENT_CLINIC_OR_DEPARTMENT_OTHER)
Admission: RE | Admit: 2013-11-23 | Discharge: 2013-11-23 | Disposition: A | Discharge status: Home / Self Care | Payer: MEDICARE | Source: Ambulatory Visit | Attending: Family Medicine | Admitting: Family Medicine

## 2013-11-23 ENCOUNTER — Telehealth: Payer: Self-pay | Admitting: *Deleted

## 2013-11-23 DIAGNOSIS — I447 Left bundle-branch block, unspecified: Secondary | ICD-10-CM | POA: Insufficient documentation

## 2013-11-23 DIAGNOSIS — I080 Rheumatic disorders of both mitral and aortic valves: Secondary | ICD-10-CM | POA: Insufficient documentation

## 2013-11-23 DIAGNOSIS — I359 Nonrheumatic aortic valve disorder, unspecified: Secondary | ICD-10-CM

## 2013-11-23 DIAGNOSIS — I079 Rheumatic tricuspid valve disease, unspecified: Secondary | ICD-10-CM | POA: Insufficient documentation

## 2013-11-23 DIAGNOSIS — I517 Cardiomegaly: Secondary | ICD-10-CM | POA: Insufficient documentation

## 2013-11-23 DIAGNOSIS — I379 Nonrheumatic pulmonary valve disorder, unspecified: Secondary | ICD-10-CM | POA: Insufficient documentation

## 2013-11-23 DIAGNOSIS — R609 Edema, unspecified: Secondary | ICD-10-CM | POA: Insufficient documentation

## 2013-11-23 DIAGNOSIS — I10 Essential (primary) hypertension: Secondary | ICD-10-CM | POA: Insufficient documentation

## 2013-11-23 DIAGNOSIS — I708 Atherosclerosis of other arteries: Secondary | ICD-10-CM | POA: Insufficient documentation

## 2013-11-23 NOTE — Progress Notes (Signed)
  Echocardiogram 2D Echocardiogram has been performed.  Stanley Meyers 11/23/2013, 9:45 AM

## 2013-11-23 NOTE — Telephone Encounter (Signed)
Pt's wife informed.Stanley Meyers  

## 2013-11-23 NOTE — Telephone Encounter (Signed)
There is nothing on the echo that would be of concern for him having cataract surgery. He has a little bit of regurgitation of a couple of bouts but it's mild. Not significant. And the pumping function of the heart is actually pretty good.

## 2013-11-23 NOTE — Telephone Encounter (Signed)
Pt's wife called and stated that the eye surgeon said that if anything showed up unfavorable he advised that he not proceed with the cataract surgery. Please advise.Stanley Meyers Circle

## 2013-12-07 ENCOUNTER — Ambulatory Visit
Admission: RE | Admit: 2013-12-07 | Discharge: 2013-12-07 | Disposition: A | Discharge status: Home / Self Care | Payer: BC Managed Care – PPO | Source: Ambulatory Visit | Attending: Sports Medicine | Admitting: Sports Medicine

## 2013-12-07 ENCOUNTER — Ambulatory Visit: Admission: RE | Admit: 2013-12-07 | Payer: MEDICARE | Source: Ambulatory Visit

## 2013-12-07 MED ORDER — METHYLPREDNISOLONE ACETATE 40 MG/ML INJ SUSP (RADIOLOG
120.0000 mg | Freq: Once | INTRAMUSCULAR | Status: AC
  Administered 2013-12-07: 120 mg via EPIDURAL

## 2013-12-07 MED ORDER — IOHEXOL 180 MG/ML  SOLN
1.0000 mL | Freq: Once | INTRAMUSCULAR | Status: AC | PRN
  Administered 2013-12-07: 1 mL via EPIDURAL

## 2013-12-07 NOTE — Discharge Instructions (Signed)

## 2013-12-12 ENCOUNTER — Other Ambulatory Visit: Payer: Self-pay | Admitting: Family Medicine

## 2013-12-13 ENCOUNTER — Other Ambulatory Visit: Payer: Self-pay | Admitting: *Deleted

## 2013-12-13 MED ORDER — LOVASTATIN 20 MG PO TABS: 20.0000 mg | ORAL_TABLET | Freq: Every day | ORAL | Status: DC

## 2013-12-16 ENCOUNTER — Other Ambulatory Visit: Payer: Self-pay | Admitting: Family Medicine

## 2013-12-19 ENCOUNTER — Other Ambulatory Visit: Payer: Self-pay | Admitting: *Deleted

## 2013-12-19 MED ORDER — ACLIDINIUM BROMIDE 400 MCG/ACT IN AEPB: 1.0000 | INHALATION_SPRAY | Freq: Two times a day (BID) | RESPIRATORY_TRACT | Status: DC

## 2013-12-22 ENCOUNTER — Emergency Department (INDEPENDENT_AMBULATORY_CARE_PROVIDER_SITE_OTHER): Payer: MEDICARE

## 2013-12-22 ENCOUNTER — Encounter: Payer: Self-pay | Admitting: Emergency Medicine

## 2013-12-22 ENCOUNTER — Emergency Department
Admission: EM | Admit: 2013-12-22 | Discharge: 2013-12-22 | Disposition: A | Discharge status: Home / Self Care | Payer: MEDICARE | Source: Home / Self Care | Attending: Emergency Medicine | Admitting: Emergency Medicine

## 2013-12-22 DIAGNOSIS — W230XXA Caught, crushed, jammed, or pinched between moving objects, initial encounter: Secondary | ICD-10-CM

## 2013-12-22 DIAGNOSIS — S92309A Fracture of unspecified metatarsal bone(s), unspecified foot, initial encounter for closed fracture: Secondary | ICD-10-CM

## 2013-12-22 DIAGNOSIS — S92355A Nondisplaced fracture of fifth metatarsal bone, left foot, initial encounter for closed fracture: Secondary | ICD-10-CM

## 2013-12-22 MED ORDER — TRAMADOL HCL 50 MG PO TABS: ORAL_TABLET | ORAL | Status: DC

## 2013-12-22 MED ORDER — TRAMADOL HCL 50 MG PO TABS: 50.0000 mg | ORAL_TABLET | Freq: Four times a day (QID) | ORAL | Status: DC | PRN

## 2013-12-22 NOTE — ED Provider Notes (Signed)
CSN: [id number]     Arrival date & time 12/22/13  1817 History   First MD Initiated Contact with Patient 12/22/13 1848     Chief Complaint  Patient presents with  . Foot Pain  . Ankle Pain    Patient is a 80 y.o. male presenting with lower extremity pain. The history is provided by the patient and the spouse.  Foot Pain This is a new problem. The current episode started 1 to 2 hours ago. The problem occurs constantly. The problem has been gradually worsening. Pertinent negatives include no chest pain, no abdominal pain, no headaches and no shortness of breath. Exacerbated by: Weightbearing. The symptoms are relieved by rest. He has tried acetaminophen for the symptoms. The treatment provided mild relief.   Reports getting left foot caught under electric cart/chair at grocery store about one hour ago. Is able to walk, but it is painful. Took regular tylenol before arriving here.  Pain in left lateral foot and ankle is 8/10 with weightbearing, 6/10 at rest. Pain is sharp.  Past Medical History  Diagnosis Date  . Kidney failure   . GERD (gastroesophageal reflux disease)   . Hypertension   . Hyperlipemia   . Spasmatic colon   . Raynaud's disease    Past Surgical History  Procedure Laterality Date  . Cholecystectomy    . Hernia repair    . Av fistula placement     History reviewed. No pertinent family history. History  Substance Use Topics  . Smoking status: Former Research scientist (life sciences)  . Smokeless tobacco: Never Used  . Alcohol Use: 1.2 oz/week    2 Glasses of wine per week    Review of Systems  Constitutional:       Patient and wife deny any other new symptoms  Respiratory: Negative for shortness of breath.   Cardiovascular: Negative for chest pain.  Gastrointestinal: Negative for abdominal pain.  Neurological: Negative for headaches.  All other systems reviewed and are negative.   Allergies  Review of patient's allergies indicates no known allergies.  Home Medications   Prior  to Admission medications   Medication Sig Start Date End Date Taking? Authorizing Provider  acetaminophen (TYLENOL) 500 MG tablet Take 1,000 mg by mouth daily.     Historical Provider, MD  Aclidinium Bromide (TUDORZA PRESSAIR) 400 MCG/ACT AEPB Inhale 1 puff into the lungs 2 (two) times daily. 12/19/13   Hali Marry, MD  albuterol (PROAIR HFA) 108 (90 BASE) MCG/ACT inhaler Inhale 2 puffs into the lungs every 6 (six) hours as needed for wheezing or shortness of breath. 10/06/12   Hali Marry, MD  AMBULATORY NON FORMULARY MEDICATION Medication Name: Shingles vaccine IM x 1 05/18/12   Hali Marry, MD  amLODipine (NORVASC) 5 MG tablet Take 1 tablet (5 mg total) by mouth daily. 02/22/13   Hali Marry, MD  calcitRIOL (ROCALTROL) 0.25 MCG capsule Take 1 capsule (0.25 mcg total) by mouth 2 (two) times a week. Take one tablet by mouth 2 times weekly 06/21/13   Hali Marry, MD  dicyclomine (BENTYL) 10 MG capsule Take 5 mg by mouth 4 (four) times daily -  before meals and at bedtime.    Historical Provider, MD  donepezil (ARICEPT) 5 MG tablet TAKE 1 TABLET (5 MG TOTAL) BY MOUTH AT BEDTIME. 12/12/13   Hali Marry, MD  famotidine (PEPCID) 40 MG tablet  12/04/12   Historical Provider, MD  furosemide (LASIX) 20 MG tablet Take 1 tablet (20 mg total)  by mouth daily. 05/16/13   Hali Marry, MD  gabapentin (NEURONTIN) 100 MG capsule One tab PO qHS for a week, then BID for a week, then TID. May double weekly to a max of 3,600mg /day 09/20/13   Silverio Decamp, MD  HYDROcodone-acetaminophen (NORCO/VICODIN) 5-325 MG per tablet Take 1 tablet by mouth every 8 (eight) hours as needed for moderate pain. 10/27/13   Silverio Decamp, MD  lovastatin (MEVACOR) 20 MG tablet Take 1 tablet (20 mg total) by mouth at bedtime. 12/13/13   Hali Marry, MD  Multiple Vitamins-Minerals (MULTIVITAMIN PO) Take by mouth.    Historical Provider, MD  pantoprazole (PROTONIX) 40 MG  tablet Take 1 tablet (40 mg total) by mouth daily. 09/27/12   Shanda Howells, MD  traMADol (ULTRAM) 50 MG tablet Take 1 tablet (50 mg total) by mouth every 6 (six) hours as needed for severe pain. Take 1/2 or 1 tablet every 12 hours as needed for severe pain.--Caution, may cause drowsiness. 12/22/13   Jacqulyn Cane, MD  TUDORZA PRESSAIR 400 MCG/ACT AEPB INHALE 1 PUFF TWICE A DAY    Hali Marry, MD   BP 135/64  Pulse 65  Temp(Src) 97.9 F (36.6 C) (Oral)  Resp 16  Ht 5\' 7"  (1.702 m)  Wt 127 lb (57.607 kg)  BMI 19.89 kg/m2  SpO2 100% Physical Exam  Nursing note and vitals reviewed. Constitutional: He is oriented to person, place, and time. He appears well-developed and well-nourished. No distress.  Very uncomfortable from right foot and ankle pain.  HENT:  Head: Normocephalic and atraumatic.  Eyes: Conjunctivae and EOM are normal. Pupils are equal, round, and reactive to light. No scleral icterus.  Neck: Normal range of motion.  Cardiovascular: Normal rate.   Pulmonary/Chest: Effort normal.  Abdominal: He exhibits no distension.  Musculoskeletal:       Right ankle: He exhibits decreased range of motion, swelling and ecchymosis. He exhibits no deformity, no laceration and normal pulse. Tenderness. Lateral malleolus and head of 5th metatarsal tenderness found. No medial malleolus tenderness found. Achilles tendon normal.       Right lower leg: Normal. He exhibits no tenderness.       Right foot: He exhibits decreased range of motion, tenderness and swelling. He exhibits normal capillary refill and no laceration.  Point of maximum tenderness is swelling and ecchymosis right lateral foot, over fifth metatarsal.  Tendons intact. He can wiggle his toes without problem. Neurovascular distally intact.  Neurological: He is alert and oriented to person, place, and time.  Skin: Skin is warm.  Psychiatric: He has a normal mood and affect.    ED Course  Procedures (including critical care  time) Labs Review Labs Reviewed - No data to display  Imaging Review Dg Ankle Complete Left  12/22/2013   CLINICAL DATA:  Ankle pain.  Injury.  EXAM: LEFT ANKLE COMPLETE - 3+ VIEW  COMPARISON:  None.  FINDINGS: Fracture of the base of the fifth metatarsal, nondisplaced. This is described under the left foot radiographs.  No ankle fracture. Ankle mortise is normally spaced and aligned. Small plantar calcaneal spur. Soft tissues are unremarkable. Bones are demineralized.  IMPRESSION: Nondisplaced fracture of the base of the fifth metatarsal.   Electronically Signed   By: Lajean Manes M.D.   On: 12/22/2013 19:35   Dg Foot Complete Left  12/22/2013   CLINICAL DATA:  Traumatic injury and pain  EXAM: LEFT FOOT - COMPLETE 3+ VIEW  COMPARISON:  None.  FINDINGS:  There is a nondisplaced fracture through the base of the fifth metatarsal. Mild soft tissue swelling is noted in this region. No other fractures are seen.  IMPRESSION: Fifth metatarsal fracture as described   Electronically Signed   By: Inez Catalina M.D.   On: 12/22/2013 19:36     MDM   1. Nondisplaced fracture of fifth metatarsal bone, left, closed, initial encounter    X-rays left ankle negative for ankle fracture. X-rays left foot show nondisplaced fracture through base of the fifth metatarsal  Treatment options discussed, as well as risks, benefits, alternatives. Patient and Wife voiced understanding and agreement with the following plans: Cam Walker Encourage rest, ice, compression with ACE bandage, and elevation of injured body part. Note that NSAIDs are contraindicated, so for pain relief advised Tylenol for mild to moderate pain. I prescribed small amount of tramadol 50 mg, but caution discussed for possible drowsiness. Advised a lower dose of one half every 8 hours if needed for severe pain.  Over 40 minutes spent, greater than 50% of the time spent for counseling and coordination of care. Other advice given .--Continue treatment  for osteoporosis Follow up with your Dr. Dianah Field within 7 days for followup, sooner if worse or new symptoms or red flags. Precautions discussed. Red flags discussed. Questions invited and answered. They voiced understanding and agreement.    Jacqulyn Cane, MD 12/22/13 2043

## 2013-12-22 NOTE — ED Notes (Signed)
Reports getting right foot caught under electric cart/chair at grocery store about one hour ago. Is able to walk, but it is painful. Took regular tylenol before arriving here.

## 2013-12-27 ENCOUNTER — Ambulatory Visit (INDEPENDENT_AMBULATORY_CARE_PROVIDER_SITE_OTHER): Payer: MEDICARE | Admitting: Sports Medicine

## 2013-12-27 ENCOUNTER — Encounter: Payer: Self-pay | Admitting: Sports Medicine

## 2013-12-27 VITALS — BP 97/57 | HR 60 | Ht 67.0 in | Wt 131.0 lb

## 2013-12-27 DIAGNOSIS — S92352A Displaced fracture of fifth metatarsal bone, left foot, initial encounter for closed fracture: Secondary | ICD-10-CM | POA: Insufficient documentation

## 2013-12-27 DIAGNOSIS — S92309A Fracture of unspecified metatarsal bone(s), unspecified foot, initial encounter for closed fracture: Secondary | ICD-10-CM
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
## 2013-12-27 NOTE — Assessment & Plan Note (Signed)
Boot was too heavy, switched to post op shoe. Continue tramadol as needed. Return in 4 weeks, xray before visit.  I billed a fracture code for this encounter, all subsequent visits will be post-op checks in the global period.

## 2014-01-17 ENCOUNTER — Telehealth: Payer: Self-pay | Admitting: *Deleted

## 2014-01-17 DIAGNOSIS — R609 Edema, unspecified: Secondary | ICD-10-CM

## 2014-01-17 MED ORDER — FUROSEMIDE 20 MG PO TABS: 40.0000 mg | ORAL_TABLET | Freq: Every day | ORAL | Status: DC

## 2014-01-17 NOTE — Telephone Encounter (Signed)
Pt's wife called and stated that he is still having edema in his ankles. She stated that she has increased the lasix to 3x a day and he seems to be tolerating this pretty well she also stated that she called his nephrologist office about this and has not heard back. I informed her that I would send in an Rx for the lasix however, she will need to have him come in to have labs done to ensure that his kidney function is ok she understood and agreed.Stanley Meyers

## 2014-01-18 LAB — COMPLETE METABOLIC PANEL WITH GFR
ALK PHOS: 69 U/L (ref 39–117)
ALT: 11 U/L (ref 0–53)
AST: 15 U/L (ref 0–37)
Albumin: 4 g/dL (ref 3.5–5.2)
BILIRUBIN TOTAL: 0.7 mg/dL (ref 0.2–1.2)
BUN: 36 mg/dL — AB (ref 6–23)
CO2: 25 meq/L (ref 19–32)
Calcium: 9.6 mg/dL (ref 8.4–10.5)
Chloride: 104 mEq/L (ref 96–112)
Creat: 2.25 mg/dL — ABNORMAL HIGH (ref 0.50–1.35)
GFR, EST NON AFRICAN AMERICAN: 25 mL/min — AB
GFR, Est African American: 29 mL/min — ABNORMAL LOW
GLUCOSE: 94 mg/dL (ref 70–99)
Potassium: 4.3 mEq/L (ref 3.5–5.3)
SODIUM: 139 meq/L (ref 135–145)
Total Protein: 6.2 g/dL (ref 6.0–8.3)

## 2014-01-19 ENCOUNTER — Other Ambulatory Visit: Payer: Self-pay | Admitting: Family Medicine

## 2014-01-19 ENCOUNTER — Telehealth: Payer: Self-pay | Admitting: *Deleted

## 2014-01-19 NOTE — Telephone Encounter (Signed)
Called and informed his wife about results and asked about his swelling. She reports that is better his only complaint is about his back.Maryruth Eve, Lahoma Crocker

## 2014-01-27 ENCOUNTER — Telehealth: Payer: Self-pay

## 2014-01-27 MED ORDER — TRAMADOL HCL 50 MG PO TABS: ORAL_TABLET | ORAL | Status: DC

## 2014-01-27 NOTE — Telephone Encounter (Signed)
Tramadol has been faxed to pharmacy. Rhonda Cunningham,CMA

## 2014-01-27 NOTE — Telephone Encounter (Signed)
Patient wife called stated that patient was seen in urgent care for foot fracture and he was given a rx Tramadol. He has ran out and she request a refill for Tramadol. Shakila Mak,CMA

## 2014-01-27 NOTE — Telephone Encounter (Signed)
Not a problem, tramadol refill is in my box.

## 2014-01-30 ENCOUNTER — Ambulatory Visit: Payer: MEDICARE | Admitting: Sports Medicine

## 2014-01-31 ENCOUNTER — Encounter: Payer: Self-pay | Admitting: Sports Medicine

## 2014-01-31 ENCOUNTER — Ambulatory Visit (INDEPENDENT_AMBULATORY_CARE_PROVIDER_SITE_OTHER): Payer: MEDICARE

## 2014-01-31 ENCOUNTER — Ambulatory Visit (INDEPENDENT_AMBULATORY_CARE_PROVIDER_SITE_OTHER): Payer: MEDICARE | Admitting: Sports Medicine

## 2014-01-31 VITALS — BP 123/54 | HR 69 | Wt 126.0 lb

## 2014-01-31 DIAGNOSIS — IMO0001 Reserved for inherently not codable concepts without codable children: Secondary | ICD-10-CM

## 2014-01-31 DIAGNOSIS — M51379 Other intervertebral disc degeneration, lumbosacral region without mention of lumbar back pain or lower extremity pain: Secondary | ICD-10-CM

## 2014-01-31 DIAGNOSIS — M51369 Other intervertebral disc degeneration, lumbar region without mention of lumbar back pain or lower extremity pain: Secondary | ICD-10-CM

## 2014-01-31 DIAGNOSIS — M5137 Other intervertebral disc degeneration, lumbosacral region: Secondary | ICD-10-CM

## 2014-01-31 DIAGNOSIS — S92352D Displaced fracture of fifth metatarsal bone, left foot, subsequent encounter for fracture with routine healing: Secondary | ICD-10-CM

## 2014-01-31 DIAGNOSIS — S92352A Displaced fracture of fifth metatarsal bone, left foot, initial encounter for closed fracture: Secondary | ICD-10-CM

## 2014-01-31 DIAGNOSIS — M5136 Other intervertebral disc degeneration, lumbar region: Secondary | ICD-10-CM

## 2014-01-31 DIAGNOSIS — S8290XD Unspecified fracture of unspecified lower leg, subsequent encounter for closed fracture with routine healing: Secondary | ICD-10-CM

## 2014-01-31 MED ORDER — MIRTAZAPINE 15 MG PO TABS: 15.0000 mg | ORAL_TABLET | Freq: Every day | ORAL | Status: DC

## 2014-01-31 NOTE — Assessment & Plan Note (Addendum)
At this point we are going to get [place] on scheduled tramadol. He did not respond to lumbar epidurals, and does not want to proceed any further with interventional treatment or facet injections which is appropriate. He will be one half tab 3 times a day scheduled for a week or 2 and then go up to one tab 3 times a day. At this point we are going to start Remeron both for pain, mood, and to help him gain some weight, at the next visit if insufficiently gaining weight, can add Megace as well, they will follow this up with her PCP.

## 2014-01-31 NOTE — Progress Notes (Signed)
  Subjective:    CC: Followup  HPI: Left fifth metatarsal fracture: Pain-free.  Low back pain: No response to an L5-S1 epidural or a selective S1 nerve root block, he did not want to proceed any further with any more interventional or invasive treatment, tramadol was effective. Has not been taking this for the back pain.  Weight loss: This likely represents an element of failure to thrive, they have not yet discussed this with her PCP, amenable to trying a medicine to help with weight gain.  Past medical history, Surgical history, Family history not pertinant except as noted below, Social history, Allergies, and medications have been entered into the medical record, reviewed, and no changes needed.   Review of Systems: No fevers, chills, night sweats, weight loss, chest pain, or shortness of breath.   Objective:    General: Well Developed, well nourished, and in no acute distress.  Neuro: Alert and oriented x3, extra-ocular muscles intact, sensation grossly intact.  HEENT: Normocephalic, atraumatic, pupils equal round reactive to light, neck supple, no masses, no lymphadenopathy, thyroid nonpalpable.  Skin: Warm and dry, no rashes. Cardiac: Regular rate and rhythm, no murmurs rubs or gallops, no lower extremity edema.  Respiratory: Clear to auscultation bilaterally. Not using accessory muscles, speaking in full sentences. Left Foot: No visible erythema or swelling. Range of motion is full in all directions. Strength is 5/5 in all directions. No hallux valgus. No pes cavus or pes planus. No abnormal callus noted. No pain over the navicular prominence, or base of fifth metatarsal. No tenderness to palpation of the calcaneal insertion of plantar fascia. No pain at the Achilles insertion. No pain over the calcaneal bursa. No pain of the retrocalcaneal bursa. No tenderness to palpation over the tarsals, metatarsals, or phalanges. No hallux rigidus or limitus. No tenderness palpation  over interphalangeal joints. No pain with compression of the metatarsal heads. Neurovascularly intact distally.  Impression and Recommendations:

## 2014-01-31 NOTE — Assessment & Plan Note (Signed)
5 weeks out, pain-free and clinically healed, x-rays look good. May transition into a regular shoe.

## 2014-02-12 ENCOUNTER — Other Ambulatory Visit: Payer: Self-pay | Admitting: Family Medicine

## 2014-02-19 ENCOUNTER — Emergency Department (HOSPITAL_COMMUNITY)
Admission: EM | Admit: 2014-02-19 | Discharge: 2014-02-19 | Discharge status: Against Medical Advice | Payer: MEDICARE | Attending: Emergency Medicine | Admitting: Emergency Medicine

## 2014-02-19 DIAGNOSIS — R112 Nausea with vomiting, unspecified: Secondary | ICD-10-CM | POA: Insufficient documentation

## 2014-02-19 DIAGNOSIS — I10 Essential (primary) hypertension: Secondary | ICD-10-CM | POA: Insufficient documentation

## 2014-02-19 DIAGNOSIS — R197 Diarrhea, unspecified: Secondary | ICD-10-CM | POA: Diagnosis not present

## 2014-02-19 MED ORDER — ONDANSETRON 4 MG PO TBDP
8.0000 mg | ORAL_TABLET | Freq: Once | ORAL | Status: AC
  Administered 2014-02-19: 8 mg via ORAL
  Filled 2014-02-19: qty 2

## 2014-02-19 NOTE — ED Notes (Signed)
After administering Zofran family inquired about wait times. This RN informed family and patient of maximum wait time and they stated intention to head home and take patient to Doctor on Tues if symptoms persist. Encouraged family to stay for evaluation but they declined. Education provided as to what symptoms to be aware of that may indicate worsening condition. Family verbalized understanding and stated they will return if things worsen.

## 2014-02-19 NOTE — ED Notes (Signed)
Patient here with complaint of nausea, vomiting, and diarrhea with eating. States that it began early this morning. Family called Doctors office and they suggested patient present for evaluation to ED. Patient is Stage IV Renal per family, but has not required dialysis as of yet. Denies other symptoms aside from chronic issues. VS WDL.

## 2014-02-24 ENCOUNTER — Encounter: Payer: Self-pay | Admitting: Emergency Medicine

## 2014-02-24 ENCOUNTER — Emergency Department
Admission: EM | Admit: 2014-02-24 | Discharge: 2014-02-24 | Disposition: A | Discharge status: Home / Self Care | Payer: MEDICARE | Source: Home / Self Care | Attending: Emergency Medicine | Admitting: Emergency Medicine

## 2014-02-24 DIAGNOSIS — N184 Chronic kidney disease, stage 4 (severe): Secondary | ICD-10-CM

## 2014-02-24 DIAGNOSIS — D649 Anemia, unspecified: Secondary | ICD-10-CM

## 2014-02-24 DIAGNOSIS — R1115 Cyclical vomiting syndrome unrelated to migraine: Secondary | ICD-10-CM

## 2014-02-24 DIAGNOSIS — F0150 Vascular dementia without behavioral disturbance: Secondary | ICD-10-CM

## 2014-02-24 DIAGNOSIS — R112 Nausea with vomiting, unspecified: Secondary | ICD-10-CM

## 2014-02-24 LAB — POCT CBC W AUTO DIFF (K'VILLE URGENT CARE)

## 2014-02-24 LAB — POCT URINALYSIS DIP (MANUAL ENTRY)
Bilirubin, UA: NEGATIVE
Blood, UA: NEGATIVE
Glucose, UA: NEGATIVE
Ketones, POC UA: NEGATIVE
Leukocytes, UA: NEGATIVE
Nitrite, UA: NEGATIVE
Protein Ur, POC: NEGATIVE
Spec Grav, UA: 1.01 (ref 1.005–1.03)
Urobilinogen, UA: NEGATIVE (ref 0–1)
pH, UA: 5.5 (ref 5–8)

## 2014-02-24 MED ORDER — ONDANSETRON 4 MG PO TBDP: 4.0000 mg | ORAL_TABLET | Freq: Three times a day (TID) | ORAL | Status: DC | PRN

## 2014-02-24 NOTE — ED Provider Notes (Signed)
CSN: [id number]     Arrival date & time 02/24/14  0945 History   First MD Initiated Contact with Patient 02/24/14 [phone]     Chief Complaint  Patient presents with  . Emesis    The history is provided by the patient and the spouse.   80 year old male PCP is Dr. Madilyn Fireman. 5 days ago, had episode of nausea and vomiting, went to emergency room, but he and wife went home without seeing Dr. after waiting in emergency room. His PCP called in Zofran 4 mg sublingual and that significantly helped, and he was able to eat and drink without problems, and was doing well without further episodes until this morning. Then, at 6 AM today, he awoke and vomited times one nonbloody vomitus. His wife gave him another Zofran 4 mg sublingual and the symptoms resolved, no further episodes. He has quite complicated past medical history, reviewed below. Had extensive GI workup about 2 years ago without any significant pathology on colonoscopy and upper endoscopy.  Associated symptoms: Denies chest pain or new shortness of breath, fever, chills, or any new urinary symptoms. His urinary flow has been stable. Denies dysuria or hematuria or pyuria. When he vomited this morning, had some epigastric pain, but that significantly resolved. No change of bowel habits. No melena or bright red blood per rectum. He has a long-standing history of anemia of chronic disease from chronic kidney disease. Hemoglobin chronically in the 11 range, blood test for ferritin and serum iron levels have been within normal limits. Wife states that his mentation has been stable, long-standing history of dementia. No new neurologic symptoms  Past Medical History  Diagnosis Date  . Kidney failure   . GERD (gastroesophageal reflux disease)   . Hypertension   . Hyperlipemia   . Spasmatic colon   . Raynaud's disease    Past Surgical History  Procedure Laterality Date  . Cholecystectomy    . Hernia repair    . Av fistula placement     History  reviewed. No pertinent family history. History  Substance Use Topics  . Smoking status: Former Research scientist (life sciences)  . Smokeless tobacco: Never Used  . Alcohol Use: 1.2 oz/week    2 Glasses of wine per week    Review of Systems  Neurological: Negative for seizures and syncope.  All other systems reviewed and are negative.   Allergies  Review of patient's allergies indicates no known allergies.  Home Medications   Prior to Admission medications   Medication Sig Start Date End Date Taking? Authorizing Provider  acetaminophen (TYLENOL) 500 MG tablet Take 1,000 mg by mouth daily.     Historical Provider, MD  Aclidinium Bromide (TUDORZA PRESSAIR) 400 MCG/ACT AEPB Inhale 1 puff into the lungs 2 (two) times daily. 12/19/13   Hali Marry, MD  albuterol (PROAIR HFA) 108 (90 BASE) MCG/ACT inhaler Inhale 2 puffs into the lungs every 6 (six) hours as needed for wheezing or shortness of breath. 10/06/12   Hali Marry, MD  AMBULATORY NON FORMULARY MEDICATION Medication Name: Shingles vaccine IM x 1 05/18/12   Hali Marry, MD  amLODipine (NORVASC) 5 MG tablet Take 1 tablet (5 mg total) by mouth daily. 02/22/13   Hali Marry, MD  calcitRIOL (ROCALTROL) 0.25 MCG capsule Take 1 capsule (0.25 mcg total) by mouth 2 (two) times a week. Take one tablet by mouth 2 times weekly 06/21/13   Hali Marry, MD  dicyclomine (BENTYL) 10 MG capsule Take 5 mg by mouth 4 (  four) times daily -  before meals and at bedtime.    Historical Provider, MD  donepezil (ARICEPT) 5 MG tablet TAKE 1 TABLET (5 MG TOTAL) BY MOUTH AT BEDTIME. 02/13/14   Hali Marry, MD  famotidine (PEPCID) 40 MG tablet  12/04/12   Historical Provider, MD  furosemide (LASIX) 20 MG tablet Take 2 tablets (40 mg total) by mouth daily. 01/17/14   Hali Marry, MD  gabapentin (NEURONTIN) 100 MG capsule One tab PO qHS for a week, then BID for a week, then TID. May double weekly to a max of 3,600mg /day 09/20/13   Silverio Decamp, MD  lovastatin (MEVACOR) 20 MG tablet Take 1 tablet (20 mg total) by mouth at bedtime. 12/13/13   Hali Marry, MD  mirtazapine (REMERON) 15 MG tablet Take 1 tablet (15 mg total) by mouth at bedtime. 01/31/14   Silverio Decamp, MD  Multiple Vitamins-Minerals (MULTIVITAMIN PO) Take by mouth.    Historical Provider, MD  ondansetron (ZOFRAN-ODT) 4 MG disintegrating tablet Take 1 tablet (4 mg total) by mouth every 8 (eight) hours as needed for nausea. 02/24/14   Jacqulyn Cane, MD  pantoprazole (PROTONIX) 40 MG tablet Take 1 tablet (40 mg total) by mouth daily. 09/27/12   Shanda Howells, MD  traMADol (ULTRAM) 50 MG tablet Take one half or 1 tablet every 8 hours as needed for severe pain.--Caution, may cause drowsiness 01/27/14   Silverio Decamp, MD  TUDORZA PRESSAIR 400 MCG/ACT AEPB INHALE 1 PUFF TWICE A DAY 01/19/14   Hali Marry, MD   BP 103/54  Pulse 72  Temp(Src) 98.4 F (36.9 C) (Oral)  Ht 5\' 7"  (1.702 m)  Wt 121 lb (54.885 kg)  BMI 18.95 kg/m2  SpO2 97% Physical Exam  Nursing note and vitals reviewed. Constitutional: He is oriented to person, place, and time. He appears well-developed and well-nourished. No distress.  He can ambulate well with cane. Alert, cooperative. No acute distress  HENT:  Right Ear: External ear normal.  Left Ear: External ear normal.  Nose: Nose normal.  Mouth/Throat: Oropharynx is clear and moist. No oropharyngeal exudate.  Eyes: Conjunctivae are normal. Pupils are equal, round, and reactive to light. Right eye exhibits no discharge. Left eye exhibits no discharge. No scleral icterus.  Neck: Normal range of motion. Neck supple. No JVD present. No tracheal deviation present.  Cardiovascular: Normal rate and regular rhythm.  Exam reveals no gallop and no friction rub.   Murmur (Grade 2/6 systolic ejection murmur) heard. Pulmonary/Chest: Effort normal and breath sounds normal. No respiratory distress. He has no wheezes. He has no  rales.  Lungs essentially clear. Occasional rhonchi, but after I asked him to cough, the rhonchi resolved and lungs clear with equal expansion.  Abdominal: Soft. Bowel sounds are normal. He exhibits no distension, no abdominal bruit, no pulsatile midline mass and no mass. There is no hepatosplenomegaly. There is tenderness (Minimal nonreproducible epigastric tenderness.--I repeated this exam 5 minutes later and abdomen was soft and completely nontender). There is no rigidity, no rebound, no guarding, no CVA tenderness, no tenderness at McBurney's point and negative Murphy's sign.  Musculoskeletal: Normal range of motion.  Lymphadenopathy:    He has no cervical adenopathy.  Neurological: He is alert and oriented to person, place, and time. No cranial nerve deficit.  Skin: Skin is warm and dry. No rash noted.  Psychiatric: He has a normal mood and affect.    ED Course  Procedures (including critical care time)  Labs Review Labs Reviewed  COMPLETE METABOLIC PANEL WITH GFR  AMYLASE  LIPASE  POCT CBC W AUTO DIFF ([facility])  POCT URINALYSIS DIP (MANUAL ENTRY)   Results for orders placed during the hospital encounter of 02/24/14  POCT CBC W AUTO DIFF ([facility])      Result Value Ref Range   WBC    4.5 - 10.5 K/uL   Lymphocytes relative %    15 - 45 %   Monocytes relative %    2 - 10 %   Neutrophils relative % (GR)    44 - 76 %   Lymphocytes absolute    0.1 - 1.8 K/uL   Monocyes absolute    0.1 - 1 K/uL   Neutrophils absolute (GR#)    1.7 - 7.7 K/uL   RBC    4.2 - 5.8 MIL/uL   Hemoglobin    13 - 17 g/dL   Hematocrit    38.5 - 51 %   MCV    80 - 98 fL   MCH    26.5 - 32.5 pg   MCHC    32.5 - 36.9 g/dL   RDW    11.6 - 14 %   Platelet count    140 - 400 K/uL   MPV    7.8 - 11 fL  POCT URINALYSIS DIP (MANUAL ENTRY)      Result Value Ref Range   Color, UA light yellow     Clarity, UA clear     Glucose, UA neg     Bilirubin, UA negative     Bilirubin, UA negative      Spec Grav, UA 1.010  1.005 - 1.03   Blood, UA negative     pH, UA 5.5  5 - 8   Protein Ur, POC negative     Urobilinogen, UA negative  0 - 1   Nitrite, UA Negative     Leukocytes, UA Negative     Urinalysis completely within normal limits.  Imaging Review No results found.   MDM   1. Non-intractable cyclical vomiting with nausea   2. Nausea and vomiting, vomiting of unspecified type     Over 40 minutes spent, greater than 50% of the time spent for counseling and coordination of care. WBC count within normal limits . Hemoglobin 11.2, MCV 94, relatively stable with history of anemia chronic disease . Urinalysis within normal limits   Other than mild dementia, physical exam within normal limits without any localized abnormality . No evidence of acute abdomen Discussed at length with patient and wife. Send off CMP ,amylase and lipase  Refill Zofran ODT 4 mg every 8 hours when necessary nausea. Follow-up with your primary care doctor in 5-7 days if not improving, or sooner if symptoms become worse. Precautions discussed. Red flags discussed. Questions invited and answered. Patient and wife voiced understanding and agreement.   Jacqulyn Cane, MD 02/24/14 [phone]

## 2014-02-24 NOTE — ED Notes (Signed)
Was seen in ER 02/19/2014 for nausea and vomiting; was given dose of Zofran at that time and felt better and was able to eat and drink the following day. This morning awoke at 0600 and vomited x 1; took Zofran 4mg  sl and no further episodes. Has compromised health due to kidney failure, so being cautious.

## 2014-02-25 ENCOUNTER — Telehealth: Payer: Self-pay | Admitting: Emergency Medicine

## 2014-02-25 LAB — COMPLETE METABOLIC PANEL WITH GFR
ALT: 12 U/L (ref 0–53)
AST: 16 U/L (ref 0–37)
Albumin: 4.1 g/dL (ref 3.5–5.2)
Alkaline Phosphatase: 77 U/L (ref 39–117)
BUN: 37 mg/dL — ABNORMAL HIGH (ref 6–23)
CO2: 27 mEq/L (ref 19–32)
Calcium: 9.2 mg/dL (ref 8.4–10.5)
Chloride: 104 mEq/L (ref 96–112)
Creat: 2.25 mg/dL — ABNORMAL HIGH (ref 0.50–1.35)
GFR, Est African American: 29 mL/min — ABNORMAL LOW
GFR, Est Non African American: 25 mL/min — ABNORMAL LOW
Glucose, Bld: 95 mg/dL (ref 70–99)
Potassium: 4 mEq/L (ref 3.5–5.3)
Sodium: 138 mEq/L (ref 135–145)
Total Bilirubin: 0.6 mg/dL (ref 0.2–1.2)
Total Protein: 6.1 g/dL (ref 6.0–8.3)

## 2014-02-25 LAB — AMYLASE: Amylase: 68 U/L (ref 0–105)

## 2014-02-25 LAB — LIPASE: Lipase: 31 U/L (ref 0–75)

## 2014-02-27 ENCOUNTER — Other Ambulatory Visit: Payer: Self-pay | Admitting: *Deleted

## 2014-02-27 ENCOUNTER — Other Ambulatory Visit: Payer: Self-pay | Admitting: Family Medicine

## 2014-02-27 DIAGNOSIS — I10 Essential (primary) hypertension: Secondary | ICD-10-CM

## 2014-02-27 MED ORDER — AMLODIPINE BESYLATE 5 MG PO TABS: 5.0000 mg | ORAL_TABLET | Freq: Every day | ORAL | Status: DC

## 2014-02-28 ENCOUNTER — Encounter: Payer: Self-pay | Admitting: Sports Medicine

## 2014-02-28 ENCOUNTER — Ambulatory Visit (INDEPENDENT_AMBULATORY_CARE_PROVIDER_SITE_OTHER): Payer: MEDICARE | Admitting: Sports Medicine

## 2014-02-28 VITALS — BP 108/51 | HR 71 | Wt 122.0 lb

## 2014-02-28 DIAGNOSIS — M51379 Other intervertebral disc degeneration, lumbosacral region without mention of lumbar back pain or lower extremity pain: Secondary | ICD-10-CM

## 2014-02-28 DIAGNOSIS — M5137 Other intervertebral disc degeneration, lumbosacral region: Secondary | ICD-10-CM

## 2014-02-28 DIAGNOSIS — M5136 Other intervertebral disc degeneration, lumbar region: Secondary | ICD-10-CM

## 2014-02-28 DIAGNOSIS — M51369 Other intervertebral disc degeneration, lumbar region without mention of lumbar back pain or lower extremity pain: Secondary | ICD-10-CM

## 2014-02-28 MED ORDER — ACETAMINOPHEN-CODEINE #3 300-30 MG PO TABS: 1.0000 | ORAL_TABLET | Freq: Four times a day (QID) | ORAL | Status: DC | PRN

## 2014-02-28 MED ORDER — LINACLOTIDE 145 MCG PO CAPS: 145.0000 ug | ORAL_CAPSULE | Freq: Every day | ORAL | Status: DC

## 2014-02-28 NOTE — Assessment & Plan Note (Signed)
Persistent pain despite gabapentin which caused intractable leg swelling, tramadol has been ineffective. Hydrocodone even at 2.5 mg has caused altered mental status. Tylenol #3, Linzess to prevent constipation. Return in one month. He will also double mirtazapine gets.

## 2014-02-28 NOTE — Progress Notes (Signed)
  Subjective:    CC: Followup  HPI: Lumbar spondylosis: No response, not even temporary to an L5-S1 epidural and in selective S1 nerve root block. Hydrocodone has been too strong, tramadol was not strong enough, has pain daily. Gabapentin caused excessive intractable lower extremity swelling so this was discontinued.  Failure to thrive: Unclear response to one half tab mirtazapine, unfortunately had a recent illness with nausea and vomiting and has lost more weight.  Past medical history, Surgical history, Family history not pertinant except as noted below, Social history, Allergies, and medications have been entered into the medical record, reviewed, and no changes needed.   Review of Systems: No fevers, chills, night sweats, weight loss, chest pain, or shortness of breath.   Objective:    General: Well Developed, well nourished, and in no acute distress.  Neuro: Alert and oriented x3, extra-ocular muscles intact, sensation grossly intact.  HEENT: Normocephalic, atraumatic, pupils equal round reactive to light, neck supple, no masses, no lymphadenopathy, thyroid nonpalpable.  Skin: Warm and dry, no rashes. Cardiac: Regular rate and rhythm, no murmurs rubs or gallops, no lower extremity edema.  Respiratory: Clear to auscultation bilaterally. Not using accessory muscles, speaking in full sentences.  Impression and Recommendations:

## 2014-03-08 ENCOUNTER — Encounter: Payer: Self-pay | Admitting: Sports Medicine

## 2014-03-08 ENCOUNTER — Ambulatory Visit (INDEPENDENT_AMBULATORY_CARE_PROVIDER_SITE_OTHER): Payer: MEDICARE | Admitting: Sports Medicine

## 2014-03-08 VITALS — BP 92/51 | HR 68 | Ht 67.0 in | Wt 124.0 lb

## 2014-03-08 DIAGNOSIS — R627 Adult failure to thrive: Secondary | ICD-10-CM | POA: Insufficient documentation

## 2014-03-08 DIAGNOSIS — M7021 Olecranon bursitis, right elbow: Secondary | ICD-10-CM

## 2014-03-08 MED ORDER — DOXYCYCLINE HYCLATE 100 MG PO TABS: 100.0000 mg | ORAL_TABLET | Freq: Two times a day (BID) | ORAL | Status: AC

## 2014-03-08 MED ORDER — DOXYCYCLINE HYCLATE 100 MG PO TABS: 100.0000 mg | ORAL_TABLET | Freq: Two times a day (BID) | ORAL | Status: DC

## 2014-03-08 NOTE — Patient Instructions (Signed)
Followup with Dr. Melrose Nakayama (Dr. Berenice Primas' partner) at [facility] orthopedics on Friday morning at 8:30 AM

## 2014-03-08 NOTE — Progress Notes (Signed)
  Subjective:    CC: Olecranon bursitis  HPI: This is a very pleasant 80 year old male with chronic renal insufficiency, generalized deconditioning, and failure to thrive. He has chronic right-sided olecranon bursitis, and occasionally drains. Initially he was set up for operative olecranon bursectomy in the operating room, unfortunately he was noted to have some bundle branch block, left, and surgery was canceled. He was subsequently seen by cardiology and was cleared for intermediate risk noncardiac surgery. He did not pursue followup with orthopedic surgery, and now comes to see me for worsening drainage.  Failure to thrive: Starting to have some weight gain after doubling mirtazapine.  Past medical history, Surgical history, Family history not pertinant except as noted below, Social history, Allergies, and medications have been entered into the medical record, reviewed, and no changes needed.   Review of Systems: No fevers, chills, night sweats, weight loss, chest pain, or shortness of breath.   Objective:    General: Well Developed, well nourished, and in no acute distress.  Neuro: Alert and oriented x3, extra-ocular muscles intact, sensation grossly intact.  HEENT: Normocephalic, atraumatic, pupils equal round reactive to light, neck supple, no masses, no lymphadenopathy, thyroid nonpalpable.  Skin: Warm and dry, no rashes. Cardiac: Regular rate and rhythm, no murmurs rubs or gallops, no lower extremity edema.  Respiratory: Clear to auscultation bilaterally. Not using accessory muscles, speaking in full sentences. Right elbow: After some debridement of the overlying tissues I still note a chronically draining wound, using a #11 blade under sterile conditions I debrided back to vitalized/bleeding tissue. This was cleaned/irrigated, and stapled shut.  Impression and Recommendations:

## 2014-03-08 NOTE — Assessment & Plan Note (Signed)
Chronic olecranon bursitis. He failed conservative measures, he was set up for an olecranon bursa excision in the operating room, had a left bundle branch block, and surgery was canceled. He has seen cardiology, has been cleared. I debrided some of the wound here in the office today, and stapled it shut. He will see Dr. Rhona Raider at [place] on Friday morning, and hopefully be in the operating room with a regional block rather than general anesthesia within a few days. Cultures have grown out pansensitive Staphylococcus aureus, we will add doxycycline in the meantime.

## 2014-03-08 NOTE — Assessment & Plan Note (Signed)
Starting to gain some weight since doubling mirtazapine. Follow this up with PCP.

## 2014-03-09 ENCOUNTER — Ambulatory Visit (INDEPENDENT_AMBULATORY_CARE_PROVIDER_SITE_OTHER): Payer: MEDICARE | Admitting: Family Medicine

## 2014-03-09 ENCOUNTER — Encounter: Payer: Self-pay | Admitting: Family Medicine

## 2014-03-09 VITALS — BP 113/55 | HR 63 | Wt 124.0 lb

## 2014-03-09 DIAGNOSIS — R112 Nausea with vomiting, unspecified: Secondary | ICD-10-CM

## 2014-03-09 DIAGNOSIS — Z23 Encounter for immunization: Secondary | ICD-10-CM

## 2014-03-09 DIAGNOSIS — N184 Chronic kidney disease, stage 4 (severe): Secondary | ICD-10-CM

## 2014-03-09 DIAGNOSIS — R627 Adult failure to thrive: Secondary | ICD-10-CM

## 2014-03-09 DIAGNOSIS — K5900 Constipation, unspecified: Secondary | ICD-10-CM

## 2014-03-09 DIAGNOSIS — R197 Diarrhea, unspecified: Secondary | ICD-10-CM

## 2014-03-09 NOTE — Progress Notes (Signed)
   Subjective:    Patient ID: NEYLAND PETTENGILL, male    DOB: 08/18/1924, 80 y.o.   MRN: [id number]  HPI Grayling is here to follow up on a month of diarrhea and vomiting off and on. The diarrhea and vomiting has resolved but he is still fatigued.  Gabapentin caused swelling . Tramadol didn't work.  Then tried a narcotic which caused his to be in "la la land".  Then given Linzess it caused bloating and then vomiting.  Decided to try tylenol arthritis, so has been using that for the last couple of days.Marland Kitchen Has beeen using a walker. He is just frustrated because not able to do his usual activities like take a walk in the neighborhood with his wife.  Constipation - has started miralax.and that has really helped. Says the vomiting has improved and seem sto have resolved.  His wife did read on the bottle to avoid in patients with kidney problems so she just wanted to make sure it was okay to use.  Decreased appetitie - he is taking mirtazepine and that his helping  Tremor is increasing.   Review of Systems     Objective:   Physical Exam  Constitutional: He is oriented to person, place, and time. He appears well-developed and well-nourished.  HENT:  Head: Normocephalic and atraumatic.  Right Ear: External ear normal.  Left Ear: External ear normal.  Cardiovascular: Normal rate, regular rhythm and normal heart sounds.   Pulmonary/Chest: Effort normal and breath sounds normal.  Neurological: He is alert and oriented to person, place, and time.  Skin: Skin is warm and dry.  Psychiatric: He has a normal mood and affect. His behavior is normal.          Assessment & Plan:  Diarrhea and vomiting seems to have resolved with moving his bowels.  Constipation-continue with the MiraLax. Discussed that he may need to use as needed or might even need to use it on a daily or every other day basis to keep his bowels moving consistently. He is no longer taking any type of narcotic medication. I did add  Linzess to his intolerance list.  Pain control-currently using Tylenol with arthritis. He would also be a good candidate for topical NSAID.  Decreased appetite doing well on mirtazapine.  CKD 4 -  Stable with no recent changes. In fact is actually following yearly with his nephrologist.

## 2014-03-09 NOTE — Patient Instructions (Signed)
Recommend trial of topical NSAID

## 2014-03-18 ENCOUNTER — Other Ambulatory Visit: Payer: Self-pay | Admitting: Family Medicine

## 2014-03-28 ENCOUNTER — Encounter: Payer: Self-pay | Admitting: Sports Medicine

## 2014-03-28 ENCOUNTER — Ambulatory Visit (INDEPENDENT_AMBULATORY_CARE_PROVIDER_SITE_OTHER): Payer: MEDICARE | Admitting: Sports Medicine

## 2014-03-28 VITALS — BP 101/51 | HR 70 | Wt 123.0 lb

## 2014-03-28 DIAGNOSIS — Z418 Encounter for other procedures for purposes other than remedying health state: Secondary | ICD-10-CM

## 2014-03-28 DIAGNOSIS — M7021 Olecranon bursitis, right elbow: Secondary | ICD-10-CM

## 2014-03-28 DIAGNOSIS — Z299 Encounter for prophylactic measures, unspecified: Secondary | ICD-10-CM | POA: Insufficient documentation

## 2014-03-28 DIAGNOSIS — R627 Adult failure to thrive: Secondary | ICD-10-CM

## 2014-03-28 DIAGNOSIS — M5136 Other intervertebral disc degeneration, lumbar region: Secondary | ICD-10-CM

## 2014-03-28 DIAGNOSIS — M51369 Other intervertebral disc degeneration, lumbar region without mention of lumbar back pain or lower extremity pain: Secondary | ICD-10-CM

## 2014-03-28 MED ORDER — DRONABINOL 5 MG PO CAPS: 5.0000 mg | ORAL_CAPSULE | Freq: Two times a day (BID) | ORAL | Status: DC

## 2014-03-28 MED ORDER — DICLOFENAC SODIUM 2 % TD SOLN: TRANSDERMAL | Status: DC

## 2014-03-28 NOTE — Assessment & Plan Note (Signed)
Stanley Meyers does desire to improve his quality of life, he also is significantly interested in decreasing his pill burden. I have removed some of his medications. Followup in one month.

## 2014-03-28 NOTE — Assessment & Plan Note (Addendum)
Persistent pain despite gabapentin which also caused intractable leg swelling, tramadol and Tylenol #3 have caused altered mental status even at low doses. Has not responded to multiple epidurals. Not a surgical candidate. Continue 1300 mg of Tylenol extended release 3 times a day, adding topical Pennsaid. I would also like him to consider a mobility evaluation for a motorized wheelchair.

## 2014-03-28 NOTE — Progress Notes (Signed)
  Subjective:    CC: Followup  HPI: Chronic right olecranon bursitis: Postop . Doing well.  Lumbar spondylosis: Has not responded to physical therapy, multiple neuropathic agents, analgesics, narcotics, or epidurals. Symptoms are persistent, at this point they are limiting his ability to walk and go outside, these are the things that predominantly bringing him joy.  Failure to thrive: Weight has stabilized on mirtazapine, desires additional weight gain.  Polypharmacy: Patient does ask if he can discontinue some of his medications.  Past medical history, Surgical history, Family history not pertinant except as noted below, Social history, Allergies, and medications have been entered into the medical record, reviewed, and no changes needed.   Review of Systems: No fevers, chills, night sweats, weight loss, chest pain, or shortness of breath.   Objective:    General: Well Developed, well nourished, and in no acute distress.  Neuro: Alert and oriented x3, extra-ocular muscles intact, sensation grossly intact.  HEENT: Normocephalic, atraumatic, pupils equal round reactive to light, neck supple, no masses, no lymphadenopathy, thyroid nonpalpable.  Skin: Warm and dry, no rashes. Cardiac: Regular rate and rhythm, no murmurs rubs or gallops, no lower extremity edema.  Respiratory: Clear to auscultation bilaterally. Not using accessory muscles, speaking in full sentences.  Impression and Recommendations:

## 2014-03-28 NOTE — Assessment & Plan Note (Addendum)
Weight has overall stabilized on mirtazapine. Still low. We are going to try Marinol, he has adult failure to thrive, persistent low blood pressure and low body weight despite mirtazapine, and chronic pain resistant to all forms of allopathic treatment.

## 2014-03-28 NOTE — Assessment & Plan Note (Signed)
Post olecranon bursectomy with regional block. Doing well.

## 2014-03-31 ENCOUNTER — Telehealth: Payer: Self-pay | Admitting: *Deleted

## 2014-03-31 NOTE — Telephone Encounter (Signed)
Pt's wife left vm stating that the surgeon put Stanley Meyers on doxy and she wants to know if it's ok to take that along with the new med you started him on.  Please advise.

## 2014-03-31 NOTE — Telephone Encounter (Signed)
Pt's wife notified.  She actually just got the new med yesterday because they had to wait for the pharmacy to get it in so she hasn't noticed a difference yet.

## 2014-03-31 NOTE — Telephone Encounter (Signed)
Yes that's fine, any response to the new medication?

## 2014-04-04 ENCOUNTER — Other Ambulatory Visit: Payer: Self-pay | Admitting: Family Medicine

## 2014-04-13 ENCOUNTER — Other Ambulatory Visit: Payer: Self-pay

## 2014-04-13 ENCOUNTER — Telehealth: Payer: Self-pay

## 2014-04-13 DIAGNOSIS — M5136 Other intervertebral disc degeneration, lumbar region: Secondary | ICD-10-CM

## 2014-04-13 DIAGNOSIS — M51369 Other intervertebral disc degeneration, lumbar region without mention of lumbar back pain or lower extremity pain: Secondary | ICD-10-CM

## 2014-04-13 MED ORDER — DICLOFENAC SODIUM 2 % TD SOLN: TRANSDERMAL | Status: DC

## 2014-04-13 NOTE — Telephone Encounter (Signed)
Patient called requested a Rx for Pennsaid sent to pharmacy Patient stated that it is helping.Suanne Marker Jaziyah Gradel,CMA

## 2014-04-26 ENCOUNTER — Ambulatory Visit: Payer: MEDICARE | Admitting: Sports Medicine

## 2014-04-27 ENCOUNTER — Encounter (HOSPITAL_BASED_OUTPATIENT_CLINIC_OR_DEPARTMENT_OTHER): Payer: MEDICARE | Attending: Internal Medicine

## 2014-04-27 ENCOUNTER — Ambulatory Visit: Payer: MEDICARE | Admitting: Sports Medicine

## 2014-04-27 DIAGNOSIS — S51001D Unspecified open wound of right elbow, subsequent encounter: Secondary | ICD-10-CM | POA: Insufficient documentation

## 2014-04-27 DIAGNOSIS — T8189XD Other complications of procedures, not elsewhere classified, subsequent encounter: Secondary | ICD-10-CM | POA: Insufficient documentation

## 2014-04-28 ENCOUNTER — Encounter: Payer: Self-pay | Admitting: Sports Medicine

## 2014-04-28 ENCOUNTER — Ambulatory Visit (INDEPENDENT_AMBULATORY_CARE_PROVIDER_SITE_OTHER): Payer: MEDICARE | Admitting: Sports Medicine

## 2014-04-28 VITALS — BP 114/56 | HR 65 | Wt 128.0 lb

## 2014-04-28 DIAGNOSIS — M170 Bilateral primary osteoarthritis of knee: Secondary | ICD-10-CM

## 2014-04-28 DIAGNOSIS — F01518 Vascular dementia, unspecified severity, with other behavioral disturbance: Secondary | ICD-10-CM

## 2014-04-28 DIAGNOSIS — F0151 Vascular dementia with behavioral disturbance: Secondary | ICD-10-CM

## 2014-04-28 DIAGNOSIS — R627 Adult failure to thrive: Secondary | ICD-10-CM

## 2014-04-28 MED ORDER — DRONABINOL 2.5 MG PO CAPS: 2.5000 mg | ORAL_CAPSULE | Freq: Two times a day (BID) | ORAL | Status: DC

## 2014-04-28 NOTE — Progress Notes (Signed)
  Subjective:    CC: follow-up  HPI: Failure to thrive: We have tried mirtazapine, which destabilizes weight, more recently we started Marinol at 5 mg twice a day, he has started to gain some weight however they think the dose is a bit too high. We have not yet tried Megace.  Dementia: Currently on Aricept, starting to get occasional hallucinations. No symptoms of dysuria, or upper respiratory symptoms.  Bilateral knee pain: History of osteoarthritis, has gotten Visco supplementation by another provider in the past, would like to restart this. Pain is moderate, persistent and localized to both joint lines.  Past medical history, Surgical history, Family history not pertinant except as noted below, Social history, Allergies, and medications have been entered into the medical record, reviewed, and no changes needed.   Review of Systems: No fevers, chills, night sweats, weight loss, chest pain, or shortness of breath.   Objective:    General: Well Developed, well nourished, and in no acute distress.  Neuro: Alert and oriented x3, extra-ocular muscles intact, sensation grossly intact.  HEENT: Normocephalic, atraumatic, pupils equal round reactive to light, neck supple, no masses, no lymphadenopathy, thyroid nonpalpable.  Skin: Warm and dry, no rashes. Cardiac: Regular rate and rhythm, no murmurs rubs or gallops, no lower extremity edema.  Respiratory: Clear to auscultation bilaterally. Not using accessory muscles, speaking in full sentences.  Impression and Recommendations:

## 2014-04-28 NOTE — Progress Notes (Signed)
[facility]  NAME:  Stanley Meyers, Stanley Meyers NO.:  [id number]  MEDICAL RECORD NO.:  [id number]      DATE OF BIRTH:  12-Aug-1924  PHYSICIAN:  Ricard Dillon, M.D.      VISIT DATE:                                  OFFICE VISIT   [name] arrives, referred through the offices of [facility], Dr. Rhona Raider, for review of a postsurgical wound on his right elbow.  His wife accompanies him today.  He underwent a right olecranon bursectomy done on March 16, 2014.  This is closed gradually over time.  Both followup visits from Dr. Rhona Raider staff note  a 1-cm opening with the last visit on April 05, 2014.  Looking back through [facility], this was followed by the patient's primary physician, I believe, starting with a bursitis in the right elbow that was very painful reviewed on February 12th, Dr. Dianah Field.  At that point, 2 mL of cloudy fluid was aspirated that grew Methicillin-sensitive (pansensitive Staph).  He was treated with Septra.  By the look of things, he underwent several I and D's of this area again by his primary physician,  ultimately, I think being referred to Dr. Rhona Raider.  He underwent a right bursectomy in March 16, 2014, however this is not completely healed over.  PAST MEDICAL HISTORY: 1. Senile osteoporosis. 2. Chronic renal failure, prepped for dialysis with shunts in his     right arm, however dialysis was never initiated.  He has secondary     hyperparathyroidism, renal osteodystrophy. 3. Raynaud syndrome. 4. BPH. 5. Osteoarthritis. 6. Olecranon bursitis of the right elbow. 7. Severe lumbar degenerative disk disease which is now limiting his     mobility. 8. Hypertension. 9. Hyperlipidemia. 10.Major depression. 11.Failure to thrive. 12.Vascular dementia. 13.COPD. 14.Chronic renal failure, stage IV. 15.Closed fracture of the fifth metatarsal of his right foot. 16.Degenerative disk disease of the  cervical spine. 17.Anemia.  CURRENT MEDICATIONS:  Voltaren topically 2% b.i.d., Aricept 5 nightly, Pepcid 40 daily, Lasix 40 daily, Remeron 15 daily, Norvasc 5 daily, calcitriol 0.25 two times a week, and Probiotics.  PHYSICAL EXAMINATION:  VITAL SIGNS:  On examination, temperature is 97.9, pulse 68, respirations 18, blood pressure 125/79.  Wound exam, over the olecranon surface of the right elbow measuring 0.6 x 0.5 x 0.3, there is tunneling from 6 to 3 o'clock of 1.4 cm.  The wound was lightly debrided with a #15 scalpel of light amounts of surface eschar.  What I can see is the granulation here looks fairly healthy.  There was nothing particularly that looked infected I did not do further cultures.  IMPRESSION:  Surgical wound nonhealing in the setting of prior pansensitive Staph aureus septic olecranon bursitis.  There is no current evidence of infection.  In my experience, this type of wound in this location is a difficult area to close.  As initial starting option, I have prescribed Iodosorb gel 2%.  We are showing wife, how to push this up into the undermining area covered with a light gauze wrap.  She will do this daily.  We will see this again in a week.  If we cannot get in any progression in the first 2-3 weeks with standard dressings, I think he will need some  form of advanced treatment option to stimulate the granulation.  We will see how this goes, I have talked to them about pressure relief, they already seemed quite versed in this.          ______________________________ Ricard Dillon, M.D.     MGR/MEDQ  D:  04/27/2014  T:  04/28/2014  Job:  [id number]

## 2014-04-28 NOTE — Assessment & Plan Note (Signed)
He did gain some weight on Marinol 5 twice a day. This dose was too high, we are going to decrease to 2.5 mg twice a day. Return in one month.

## 2014-04-28 NOTE — Assessment & Plan Note (Signed)
Return to start Orthovisc and steroid injection.

## 2014-04-28 NOTE — Assessment & Plan Note (Signed)
Continue Aricept. We may need to consider combination Namenda and Aricept in the future. Getting some occasional sundowning, we will check a urinalysis,Certainly may need to consider Haldol.

## 2014-05-01 ENCOUNTER — Ambulatory Visit: Payer: MEDICARE | Admitting: Sports Medicine

## 2014-05-02 ENCOUNTER — Other Ambulatory Visit: Payer: Self-pay | Admitting: Family Medicine

## 2014-05-02 ENCOUNTER — Ambulatory Visit (INDEPENDENT_AMBULATORY_CARE_PROVIDER_SITE_OTHER): Payer: MEDICARE

## 2014-05-02 ENCOUNTER — Ambulatory Visit (INDEPENDENT_AMBULATORY_CARE_PROVIDER_SITE_OTHER): Payer: MEDICARE | Admitting: Sports Medicine

## 2014-05-02 ENCOUNTER — Encounter: Payer: Self-pay | Admitting: Sports Medicine

## 2014-05-02 VITALS — BP 123/66 | HR 72 | Wt 128.0 lb

## 2014-05-02 DIAGNOSIS — M25562 Pain in left knee: Secondary | ICD-10-CM

## 2014-05-02 DIAGNOSIS — R627 Adult failure to thrive: Secondary | ICD-10-CM

## 2014-05-02 DIAGNOSIS — M170 Bilateral primary osteoarthritis of knee: Secondary | ICD-10-CM

## 2014-05-02 NOTE — Progress Notes (Signed)
   Procedure: Real-time Ultrasound Guided aspiration/Injection of left knee Device: GE Logiq E  Verbal informed consent obtained.  Time-out conducted.  Noted no overlying erythema, induration, or other signs of local infection.  Skin prepped in a sterile fashion.  Local anesthesia: Topical Ethyl chloride.  With sterile technique and under real time ultrasound guidance:  20 mL of straw-colored fluid aspirated, syringe switched and 2 mL kenalog 40, 4 mL lidocaine injected easily, syringe switched and 30 mg/2 mL of OrthoVisc (sodium hyaluronate) in a prefilled syringe was injected easily into the knee through a 22-gauge needle. Completed without difficulty  Pain immediately resolved suggesting accurate placement of the medication.  Advised to call if fevers/chills, erythema, induration, drainage, or persistent bleeding.  Images permanently stored and available for review in the ultrasound unit.  Impression: Technically successful ultrasound guided injection.  Procedure: Real-time Ultrasound Guided Injection of left knee Device: GE Logiq E  Verbal informed consent obtained.  Time-out conducted.  Noted no overlying erythema, induration, or other signs of local infection.  Skin prepped in a sterile fashion.  Local anesthesia: Topical Ethyl chloride.  With sterile technique and under real time ultrasound guidance:  2 mL kenalog 40, 4 mL lidocaine injected easily, syringe switched and 30 mg/2 mL of OrthoVisc (sodium hyaluronate) in a prefilled syringe was injected easily into the knee through a 22-gauge needle. Completed without difficulty  Pain immediately resolved suggesting accurate placement of the medication.  Advised to call if fevers/chills, erythema, induration, drainage, or persistent bleeding.  Images permanently stored and available for review in the ultrasound unit.  Impression: Technically successful ultrasound guided injection.

## 2014-05-02 NOTE — Assessment & Plan Note (Signed)
Gaining weight on Marinol. Follow this up with PCP.

## 2014-05-02 NOTE — Assessment & Plan Note (Signed)
Steroid and Orthovisc injection #1 into both knees. Return in one week for injection #2.

## 2014-05-03 LAB — URINALYSIS
Bilirubin Urine: NEGATIVE
Glucose, UA: NEGATIVE mg/dL
Hgb urine dipstick: NEGATIVE
Ketones, ur: NEGATIVE mg/dL
Leukocytes, UA: NEGATIVE
Nitrite: NEGATIVE
Protein, ur: NEGATIVE mg/dL
Specific Gravity, Urine: 1.015 (ref 1.005–1.030)
Urobilinogen, UA: 0.2 mg/dL (ref 0.0–1.0)
pH: 5 (ref 5.0–8.0)

## 2014-05-05 DIAGNOSIS — S51001D Unspecified open wound of right elbow, subsequent encounter: Secondary | ICD-10-CM | POA: Diagnosis not present

## 2014-05-05 DIAGNOSIS — T8189XD Other complications of procedures, not elsewhere classified, subsequent encounter: Secondary | ICD-10-CM | POA: Diagnosis not present

## 2014-05-07 ENCOUNTER — Emergency Department (HOSPITAL_BASED_OUTPATIENT_CLINIC_OR_DEPARTMENT_OTHER): Payer: MEDICARE

## 2014-05-07 ENCOUNTER — Encounter (HOSPITAL_BASED_OUTPATIENT_CLINIC_OR_DEPARTMENT_OTHER): Payer: Self-pay | Admitting: *Deleted

## 2014-05-07 ENCOUNTER — Emergency Department (HOSPITAL_BASED_OUTPATIENT_CLINIC_OR_DEPARTMENT_OTHER)
Admission: EM | Admit: 2014-05-07 | Discharge: 2014-05-07 | Disposition: A | Discharge status: Home / Self Care | Payer: MEDICARE | Attending: Emergency Medicine | Admitting: Emergency Medicine

## 2014-05-07 DIAGNOSIS — K219 Gastro-esophageal reflux disease without esophagitis: Secondary | ICD-10-CM | POA: Diagnosis not present

## 2014-05-07 DIAGNOSIS — Z87891 Personal history of nicotine dependence: Secondary | ICD-10-CM | POA: Insufficient documentation

## 2014-05-07 DIAGNOSIS — Z87448 Personal history of other diseases of urinary system: Secondary | ICD-10-CM | POA: Insufficient documentation

## 2014-05-07 DIAGNOSIS — S29001A Unspecified injury of muscle and tendon of front wall of thorax, initial encounter: Secondary | ICD-10-CM | POA: Diagnosis present

## 2014-05-07 DIAGNOSIS — Y9389 Activity, other specified: Secondary | ICD-10-CM | POA: Insufficient documentation

## 2014-05-07 DIAGNOSIS — S79912A Unspecified injury of left hip, initial encounter: Secondary | ICD-10-CM | POA: Diagnosis not present

## 2014-05-07 DIAGNOSIS — W010XXA Fall on same level from slipping, tripping and stumbling without subsequent striking against object, initial encounter: Secondary | ICD-10-CM | POA: Diagnosis not present

## 2014-05-07 DIAGNOSIS — Y92002 Bathroom of unspecified non-institutional (private) residence single-family (private) house as the place of occurrence of the external cause: Secondary | ICD-10-CM | POA: Insufficient documentation

## 2014-05-07 DIAGNOSIS — Y998 Other external cause status: Secondary | ICD-10-CM | POA: Diagnosis not present

## 2014-05-07 DIAGNOSIS — T148XXA Other injury of unspecified body region, initial encounter: Secondary | ICD-10-CM

## 2014-05-07 DIAGNOSIS — I10 Essential (primary) hypertension: Secondary | ICD-10-CM | POA: Diagnosis not present

## 2014-05-07 DIAGNOSIS — T148 Other injury of unspecified body region: Secondary | ICD-10-CM | POA: Diagnosis not present

## 2014-05-07 DIAGNOSIS — Z791 Long term (current) use of non-steroidal anti-inflammatories (NSAID): Secondary | ICD-10-CM | POA: Insufficient documentation

## 2014-05-07 DIAGNOSIS — Z8639 Personal history of other endocrine, nutritional and metabolic disease: Secondary | ICD-10-CM | POA: Insufficient documentation

## 2014-05-07 DIAGNOSIS — I7300 Raynaud's syndrome without gangrene: Secondary | ICD-10-CM | POA: Insufficient documentation

## 2014-05-07 DIAGNOSIS — W19XXXA Unspecified fall, initial encounter: Secondary | ICD-10-CM

## 2014-05-07 DIAGNOSIS — Z79899 Other long term (current) drug therapy: Secondary | ICD-10-CM | POA: Insufficient documentation

## 2014-05-07 LAB — URINALYSIS, ROUTINE W REFLEX MICROSCOPIC
BILIRUBIN URINE: NEGATIVE
GLUCOSE, UA: NEGATIVE mg/dL
HGB URINE DIPSTICK: NEGATIVE
Ketones, ur: NEGATIVE mg/dL
Leukocytes, UA: NEGATIVE
Nitrite: NEGATIVE
PROTEIN: NEGATIVE mg/dL
Specific Gravity, Urine: 1.013 (ref 1.005–1.030)
UROBILINOGEN UA: 0.2 mg/dL (ref 0.0–1.0)
pH: 5.5 (ref 5.0–8.0)

## 2014-05-07 MED ORDER — ACETAMINOPHEN 325 MG PO TABS: 650.0000 mg | ORAL_TABLET | Freq: Four times a day (QID) | ORAL | Status: AC | PRN

## 2014-05-07 MED ORDER — ACETAMINOPHEN 325 MG PO TABS
650.0000 mg | ORAL_TABLET | Freq: Once | ORAL | Status: AC
  Administered 2014-05-07: 650 mg via ORAL
  Filled 2014-05-07: qty 2

## 2014-05-07 NOTE — ED Provider Notes (Signed)
CSN: [id number]     Arrival date & time 05/07/14  6283 History   First MD Initiated Contact with Patient 05/07/14 [phone]     Chief Complaint  Patient presents with  . Fall     (Consider location/radiation/quality/duration/timing/severity/associated sxs/prior Treatment) HPI  This is an 80 year old male with a history of reflux, hypertension, hyperlipidemia who presents following a fall. Patient reports that he fell in his bathroom at approximately 4:30 this morning. He is unsure whether whether he slipped and fell. He does not think he passed out. He denies any dizziness. He is unsure whether he lost consciousness. Upon arrival of his family to the bathroom, he was sitting on the side of the tub. He does not take his head. He is not on any blood thinners. He is reporting left sided pain in his rib cage and left hip. He has not taken anything for the pain. He has been ambulatory.  Current pain is 7 out of 10.  Past Medical History  Diagnosis Date  . Kidney failure   . GERD (gastroesophageal reflux disease)   . Hypertension   . Hyperlipemia   . Spasmatic colon   . Raynaud's disease    Past Surgical History  Procedure Laterality Date  . Cholecystectomy    . Hernia repair    . Av fistula placement     No family history on file. History  Substance Use Topics  . Smoking status: Former Research scientist (life sciences)  . Smokeless tobacco: Never Used  . Alcohol Use: 1.2 oz/week    2 Glasses of wine per week    Review of Systems  Constitutional: Negative.  Negative for fever.  Respiratory: Negative.  Negative for chest tightness and shortness of breath.   Cardiovascular: Positive for chest pain. Negative for leg swelling.  Gastrointestinal: Negative.  Negative for nausea, vomiting, abdominal pain and diarrhea.  Genitourinary: Negative.  Negative for dysuria.  Musculoskeletal: Negative for back pain.       Left hip pain  Skin: Negative for wound.  Neurological: Negative for headaches.   Psychiatric/Behavioral: Negative for confusion.  All other systems reviewed and are negative.     Allergies  Gabapentin; Linzess; and Tramadol  Home Medications   Prior to Admission medications   Medication Sig Start Date End Date Taking? Authorizing Provider  acetaminophen (TYLENOL) 325 MG tablet Take 2 tablets (650 mg total) by mouth every 6 (six) hours as needed. 05/07/14   Merryl Hacker, MD  Diclofenac Sodium 2 % SOLN Apply to affected area twice a day 04/13/14   Silverio Decamp, MD  dicyclomine (BENTYL) 10 MG capsule Take 5 mg by mouth 4 (four) times daily -  before meals and at bedtime.    Historical Provider, MD  donepezil (ARICEPT) 5 MG tablet TAKE 1 TABLET AT BEDTIME 04/04/14   Hali Marry, MD  dronabinol (MARINOL) 2.5 MG capsule Take 1 capsule (2.5 mg total) by mouth 2 (two) times daily before lunch and supper. For adult failure to thrive and anorexia. 04/28/14   Silverio Decamp, MD  famotidine (PEPCID) 40 MG tablet  12/04/12   Historical Provider, MD  furosemide (LASIX) 20 MG tablet TAKE 2 TABLETS (40 MG TOTAL) BY MOUTH DAILY. 03/20/14   Hali Marry, MD  mirtazapine (REMERON) 15 MG tablet Take 1 tablet (15 mg total) by mouth at bedtime. 01/31/14   Silverio Decamp, MD  pantoprazole (PROTONIX) 40 MG tablet Take 1 tablet (40 mg total) by mouth daily. 09/27/12   Remo Lipps  Newton, MD   BP 142/57 mmHg  Pulse 64  Temp(Src) 98 F (36.7 C)  Resp 18  Wt 126 lb (57.153 kg)  SpO2 100% Physical Exam  Constitutional: He is oriented to person, place, and time. No distress.  Elderly  HENT:  Head: Normocephalic and atraumatic.  Mouth/Throat: Oropharynx is clear and moist.  Eyes: Pupils are equal, round, and reactive to light.  Neck: Neck supple.  No midline tenderness to palpation  Cardiovascular: Normal rate, regular rhythm and normal heart sounds.   No murmur heard. Pulmonary/Chest: Effort normal and breath sounds normal. No respiratory  distress. He has no wheezes. He exhibits tenderness.  Tenderness to palpation over the left chest wall without crepitus, no ecchymosis noted  Abdominal: Soft. Bowel sounds are normal. There is no tenderness. There is no rebound and no guarding.  Musculoskeletal: He exhibits no edema.  Normal range of motion of the bilateral hips and knees, no obvious deformity  Neurological: He is alert and oriented to person, place, and time.  Skin: Skin is warm and dry.  Psychiatric: He has a normal mood and affect.  Nursing note and vitals reviewed.   ED Course  Procedures (including critical care time) Labs Review Labs Reviewed  URINALYSIS, ROUTINE W REFLEX MICROSCOPIC    Imaging Review Dg Chest 2 View  05/07/2014   CLINICAL DATA:  Fall today with anterior chest pain and rib pain.  EXAM: CHEST - 2 VIEW  COMPARISON:  07/22/2013  FINDINGS: There is no evidence of pulmonary edema, consolidation, pneumothorax, nodule or pleural fluid. Significant chronic lung disease again noted with areas of calcified pleural plaque bilaterally as well as calcified hilar lymph nodes. No acute rib fractures identified. The heart size and mediastinal contours are normal. Stable mild spondylosis of the thoracic spine without visualized thoracic fracture.  IMPRESSION: No active disease. Stable chronic lung disease and bilateral calcified pleural plaque.   Electronically Signed   By: Aletta Edouard M.D.   On: 05/07/2014 11:02   Dg Pelvis 1-2 Views  05/07/2014   CLINICAL DATA:  Fall.  EXAM: PELVIS - 1-2 VIEW  COMPARISON:  None.  FINDINGS: There is no evidence of pelvic fracture or diastasis. Curvilinear lucencies superimposed over the right inferior pubic ramus is felt to represent air adjacent to stool in the rectum. The hip joints show normal alignment and appearance in the frontal projection bilaterally. Significant spondylosis visualized at L4-5 and L5-S1. No pelvic bone lesions are seen.  IMPRESSION: No evidence of acute  pelvic fracture.   Electronically Signed   By: Aletta Edouard M.D.   On: 05/07/2014 11:04   Ct Head Wo Contrast  05/07/2014   CLINICAL DATA:  Fall 6 hr ago.  Small vertex laceration.  EXAM: CT HEAD WITHOUT CONTRAST  TECHNIQUE: Contiguous axial images were obtained from the base of the skull through the vertex without intravenous contrast.  COMPARISON:  None.  FINDINGS: No acute intracranial abnormality. Specifically, no hemorrhage, hydrocephalus, mass lesion, acute infarction, or significant intracranial injury. No acute calvarial abnormality.  IMPRESSION: Unremarkable head CT for patient's age.   Electronically Signed   By: Rolm Baptise M.D.   On: 05/07/2014 10:40     EKG Interpretation   Date/Time:  Sunday May 07 2014 09:50:51 EST Ventricular Rate:  68 PR Interval:  186 QRS Duration: 144 QT Interval:  410 QTC Calculation: 435 R Axis:   42 Text Interpretation:  Normal sinus rhythm Left bundle branch block  Abnormal ECG Known LBBB Confirmed by HORTON  MD, Loma Sousa ([id number]) on  05/07/2014 11:58:41 AM      MDM   Final diagnoses:  Fall  Contusion    Patient presents following a fall. Mostly complaining of left chest pain. No obvious injury. He is unclear whether he had a mechanical fall and is unsure whether he hit his head or lost consciousness. Workup including CT head, plain films of the pelvis and the chest were obtained. Imaging is reassuring. EKG shows left bundle branch block which she is known to have.  No evidence of urinary tract infection. Patient was able to ambulate at his baseline. He reports improved pain with Tylenol.  After history, exam, and medical workup I feel the patient has been appropriately medically screened and is safe for discharge home. Pertinent diagnoses were discussed with the patient. Patient was given return precautions.     Merryl Hacker, MD 05/07/14 [phone]

## 2014-05-07 NOTE — ED Notes (Signed)
Unable to urinate for sample

## 2014-05-07 NOTE — Discharge Instructions (Signed)
Contusion A contusion is a deep bruise. Contusions are the result of an injury that caused bleeding under the skin. The contusion may turn blue, purple, or yellow. Minor injuries will give you a painless contusion, but more severe contusions may stay painful and swollen for a few weeks.  CAUSES  A contusion is usually caused by a blow, trauma, or direct force to an area of the body. SYMPTOMS   Swelling and redness of the injured area.  Bruising of the injured area.  Tenderness and soreness of the injured area.  Pain. DIAGNOSIS  The diagnosis can be made by taking a history and physical exam. An X-ray, CT scan, or MRI may be needed to determine if there were any associated injuries, such as fractures. TREATMENT  Specific treatment will depend on what area of the body was injured. In general, the best treatment for a contusion is resting, icing, elevating, and applying cold compresses to the injured area. Over-the-counter medicines may also be recommended for pain control. Ask your caregiver what the best treatment is for your contusion. HOME CARE INSTRUCTIONS   Put ice on the injured area.  Put ice in a plastic bag.  Place a towel between your skin and the bag.  Leave the ice on for 15-20 minutes, 3-4 times a day, or as directed by your health care provider.  Only take over-the-counter or prescription medicines for pain, discomfort, or fever as directed by your caregiver. Your caregiver may recommend avoiding anti-inflammatory medicines (aspirin, ibuprofen, and naproxen) for 48 hours because these medicines may increase bruising.  Rest the injured area.  If possible, elevate the injured area to reduce swelling. SEEK IMMEDIATE MEDICAL CARE IF:   You have increased bruising or swelling.  You have pain that is getting worse.  Your swelling or pain is not relieved with medicines. MAKE SURE YOU:   Understand these instructions.  Will watch your condition.  Will get help right  away if you are not doing well or get worse. Document Released: 03/12/2005 Document Revised: 06/07/2013 Document Reviewed: 04/07/2011 [facility] Patient Information 2015 [place], [place]. This information is not intended to replace advice given to you by your health care provider. Make sure you discuss any questions you have with your health care provider.  Fall Prevention and Home Safety Falls cause injuries and can affect all age groups. It is possible to use preventive measures to significantly decrease the likelihood of falls. There are many simple measures which can make your home safer and prevent falls. OUTDOORS  Repair cracks and edges of walkways and driveways.  Remove high doorway thresholds.  Trim shrubbery on the main path into your home.  Have good outside lighting.  Clear walkways of tools, rocks, debris, and clutter.  Check that handrails are not broken and are securely fastened. Both sides of steps should have handrails.  Have leaves, snow, and ice cleared regularly.  Use sand or salt on walkways during winter months.  In the garage, clean up grease or oil spills. BATHROOM  Install night lights.  Install grab bars by the toilet and in the tub and shower.  Use non-skid mats or decals in the tub or shower.  Place a plastic non-slip stool in the shower to sit on, if needed.  Keep floors dry and clean up all water on the floor immediately.  Remove soap buildup in the tub or shower on a regular basis.  Secure bath mats with non-slip, double-sided rug tape.  Remove throw rugs and tripping  from the floors. °BEDROOMS °· Install night lights. °· Make sure a bedside light is easy to reach. °· Do not use oversized bedding. °· Keep a telephone by your bedside. °· Have a firm chair with side arms to use for getting dressed. °· Remove throw rugs and tripping hazards from the floor. °KITCHEN °· Keep handles on pots and pans turned toward the center of the stove. Use  back burners when possible. °· Clean up spills quickly and allow time for drying. °· Avoid walking on wet floors. °· Avoid hot utensils and knives. °· Position shelves so they are not too high or low. °· Place commonly used objects within easy reach. °· If necessary, use a sturdy step stool with a grab bar when reaching. °· Keep electrical cables out of the way. °· Do not use floor polish or wax that makes floors slippery. If you must use wax, use non-skid floor wax. °· Remove throw rugs and tripping hazards from the floor. °STAIRWAYS °· Never leave objects on stairs. °· Place handrails on both sides of stairways and use them. Fix any loose handrails. Make sure handrails on both sides of the stairways are as long as the stairs. °· Check carpeting to make sure it is firmly attached along stairs. Make repairs to worn or loose carpet promptly. °· Avoid placing throw rugs at the top or bottom of stairways, or properly secure the rug with carpet tape to prevent slippage. Get rid of throw rugs, if possible. °· Have an electrician put in a light switch at the top and bottom of the stairs. °OTHER FALL PREVENTION TIPS °· Wear low-heel or rubber-soled shoes that are supportive and fit well. Wear closed toe shoes. °· When using a stepladder, make sure it is fully opened and both spreaders are firmly locked. Do not climb a closed stepladder. °· Add color or contrast paint or tape to grab bars and handrails in your home. Place contrasting color strips on first and last steps. °· Learn and use mobility aids as needed. Install an electrical emergency response system. °· Turn on lights to avoid dark areas. Replace light bulbs that burn out immediately. Get light switches that glow. °· Arrange furniture to create clear pathways. Keep furniture in the same place. °· Firmly attach carpet with non-skid or double-sided tape. °· Eliminate uneven floor surfaces. °· Select a carpet pattern that does not visually hide the edge of  steps. °· Be aware of all pets. °OTHER HOME SAFETY TIPS °· Set the water temperature for 120° F (48.8° C). °· Keep emergency numbers on or near the telephone. °· Keep smoke detectors on every level of the home and near sleeping areas. °Document Released: 05/23/2002 Document Revised: 12/02/2011 Document Reviewed: 08/22/2011 °[employer]® Patient Information ©2015 [employer]. This information is not intended to replace advice given to you by your health care provider. Make sure you discuss any questions you have with your health care provider. ° °

## 2014-05-07 NOTE — ED Notes (Signed)
Patient slipped and fell in the bathroom around 4:30 this morning, c/o pain in shoulder and ribs

## 2014-05-09 ENCOUNTER — Ambulatory Visit (INDEPENDENT_AMBULATORY_CARE_PROVIDER_SITE_OTHER): Payer: MEDICARE | Admitting: Sports Medicine

## 2014-05-09 ENCOUNTER — Encounter: Payer: Self-pay | Admitting: Sports Medicine

## 2014-05-09 VITALS — BP 116/60 | HR 81 | Wt 123.0 lb

## 2014-05-09 DIAGNOSIS — R627 Adult failure to thrive: Secondary | ICD-10-CM

## 2014-05-09 DIAGNOSIS — M170 Bilateral primary osteoarthritis of knee: Secondary | ICD-10-CM

## 2014-05-09 NOTE — Assessment & Plan Note (Signed)
Had continued to gain weight even on low-dose Marinol. Unfortunately had a fall, so this was discontinued. He will follow this up with PCP.

## 2014-05-09 NOTE — Assessment & Plan Note (Signed)
Pain-free after Orthovisc injection #1. Orthovisc injection #2 into both knees as above. Return in one week for #3.

## 2014-05-09 NOTE — Progress Notes (Signed)

## 2014-05-18 ENCOUNTER — Ambulatory Visit (INDEPENDENT_AMBULATORY_CARE_PROVIDER_SITE_OTHER): Payer: MEDICARE | Admitting: Sports Medicine

## 2014-05-18 ENCOUNTER — Encounter: Payer: Self-pay | Admitting: Sports Medicine

## 2014-05-18 VITALS — BP 125/59 | HR 66 | Wt 125.0 lb

## 2014-05-18 DIAGNOSIS — M170 Bilateral primary osteoarthritis of knee: Secondary | ICD-10-CM

## 2014-05-18 NOTE — Assessment & Plan Note (Signed)
Pain-free. Orthovisc injection #3 into both knees as above. Return in one week for #4.

## 2014-05-18 NOTE — Progress Notes (Signed)

## 2014-05-19 ENCOUNTER — Encounter (HOSPITAL_BASED_OUTPATIENT_CLINIC_OR_DEPARTMENT_OTHER): Payer: MEDICARE | Attending: Internal Medicine

## 2014-05-19 DIAGNOSIS — M71121 Other infective bursitis, right elbow: Secondary | ICD-10-CM | POA: Insufficient documentation

## 2014-05-19 DIAGNOSIS — T814XXD Infection following a procedure, subsequent encounter: Secondary | ICD-10-CM | POA: Insufficient documentation

## 2014-05-19 DIAGNOSIS — Y839 Surgical procedure, unspecified as the cause of abnormal reaction of the patient, or of later complication, without mention of misadventure at the time of the procedure: Secondary | ICD-10-CM | POA: Insufficient documentation

## 2014-05-19 DIAGNOSIS — B957 Other staphylococcus as the cause of diseases classified elsewhere: Secondary | ICD-10-CM | POA: Diagnosis not present

## 2014-05-21 ENCOUNTER — Other Ambulatory Visit: Payer: Self-pay | Admitting: Family Medicine

## 2014-05-25 ENCOUNTER — Encounter: Payer: Self-pay | Admitting: Sports Medicine

## 2014-05-25 ENCOUNTER — Ambulatory Visit (INDEPENDENT_AMBULATORY_CARE_PROVIDER_SITE_OTHER): Payer: MEDICARE | Admitting: Sports Medicine

## 2014-05-25 VITALS — BP 124/58 | HR 70 | Wt 124.0 lb

## 2014-05-25 DIAGNOSIS — M170 Bilateral primary osteoarthritis of knee: Secondary | ICD-10-CM

## 2014-05-25 NOTE — Assessment & Plan Note (Signed)
Starting to feel better after Orthovisc No. 3. Orthovisc No. 4 into both knees as above. Return in one month.

## 2014-05-25 NOTE — Progress Notes (Signed)

## 2014-05-26 DIAGNOSIS — T814XXD Infection following a procedure, subsequent encounter: Secondary | ICD-10-CM | POA: Diagnosis not present

## 2014-05-26 DIAGNOSIS — B957 Other staphylococcus as the cause of diseases classified elsewhere: Secondary | ICD-10-CM | POA: Diagnosis not present

## 2014-05-26 DIAGNOSIS — M71121 Other infective bursitis, right elbow: Secondary | ICD-10-CM | POA: Diagnosis not present

## 2014-06-02 DIAGNOSIS — B957 Other staphylococcus as the cause of diseases classified elsewhere: Secondary | ICD-10-CM | POA: Diagnosis not present

## 2014-06-02 DIAGNOSIS — T814XXD Infection following a procedure, subsequent encounter: Secondary | ICD-10-CM | POA: Diagnosis not present

## 2014-06-02 DIAGNOSIS — M71121 Other infective bursitis, right elbow: Secondary | ICD-10-CM | POA: Diagnosis not present

## 2014-06-08 DIAGNOSIS — M71121 Other infective bursitis, right elbow: Secondary | ICD-10-CM | POA: Diagnosis not present

## 2014-06-08 DIAGNOSIS — T814XXD Infection following a procedure, subsequent encounter: Secondary | ICD-10-CM | POA: Diagnosis not present

## 2014-06-08 DIAGNOSIS — B957 Other staphylococcus as the cause of diseases classified elsewhere: Secondary | ICD-10-CM | POA: Diagnosis not present

## 2014-06-14 ENCOUNTER — Other Ambulatory Visit: Payer: Self-pay | Admitting: Sports Medicine

## 2014-06-15 DIAGNOSIS — T814XXD Infection following a procedure, subsequent encounter: Secondary | ICD-10-CM | POA: Diagnosis not present

## 2014-06-15 DIAGNOSIS — B957 Other staphylococcus as the cause of diseases classified elsewhere: Secondary | ICD-10-CM | POA: Diagnosis not present

## 2014-06-15 DIAGNOSIS — M71121 Other infective bursitis, right elbow: Secondary | ICD-10-CM | POA: Diagnosis not present

## 2014-06-16 ENCOUNTER — Other Ambulatory Visit: Payer: Self-pay | Admitting: Sports Medicine

## 2014-06-23 ENCOUNTER — Encounter (HOSPITAL_BASED_OUTPATIENT_CLINIC_OR_DEPARTMENT_OTHER): Payer: BLUE CROSS/BLUE SHIELD | Attending: Internal Medicine

## 2014-06-23 DIAGNOSIS — T8189XA Other complications of procedures, not elsewhere classified, initial encounter: Secondary | ICD-10-CM | POA: Diagnosis not present

## 2014-06-23 DIAGNOSIS — S51001A Unspecified open wound of right elbow, initial encounter: Secondary | ICD-10-CM | POA: Insufficient documentation

## 2014-06-26 ENCOUNTER — Ambulatory Visit (INDEPENDENT_AMBULATORY_CARE_PROVIDER_SITE_OTHER): Payer: BLUE CROSS/BLUE SHIELD | Admitting: Sports Medicine

## 2014-06-26 VITALS — BP 94/42 | HR 58 | Wt 127.0 lb

## 2014-06-26 DIAGNOSIS — M7021 Olecranon bursitis, right elbow: Secondary | ICD-10-CM

## 2014-06-26 DIAGNOSIS — M51369 Other intervertebral disc degeneration, lumbar region without mention of lumbar back pain or lower extremity pain: Secondary | ICD-10-CM

## 2014-06-26 DIAGNOSIS — M170 Bilateral primary osteoarthritis of knee: Secondary | ICD-10-CM

## 2014-06-26 DIAGNOSIS — M5136 Other intervertebral disc degeneration, lumbar region: Secondary | ICD-10-CM

## 2014-06-26 MED ORDER — AMBULATORY NON FORMULARY MEDICATION: Status: DC

## 2014-06-26 NOTE — Assessment & Plan Note (Signed)
Doing extremely well after a series of Orthovisc and is pain free with regards to his knees.

## 2014-06-26 NOTE — Assessment & Plan Note (Signed)
Unfortunately has not responded to multiple medications, physical therapy, several injections. At this point I am going to give him a prescription for a motorized scooter to improve his mobility when the summertime comes.

## 2014-06-26 NOTE — Assessment & Plan Note (Signed)
Unfortunately continues to have a persistently draining tract. He will continue to follow-up with the wound care center.

## 2014-06-26 NOTE — Progress Notes (Signed)
  Subjective:    CC: Follow-up  HPI: Bilateral knee osteoarthritis: Pain-free now after a full series of Orthovisc into both knees.  Lumbar radiculopathy: Unfortunately has not responded to medications, therapy, multiple injections.   Chronic right olecranon bursitis: There does seem to be a chronically draining tract after olecranon bursectomy. He is currently seeing wound care center.  Past medical history, Surgical history, Family history not pertinant except as noted below, Social history, Allergies, and medications have been entered into the medical record, reviewed, and no changes needed.   Review of Systems: No fevers, chills, night sweats, weight loss, chest pain, or shortness of breath.   Objective:    General: Well Developed, well nourished, and in no acute distress.  Neuro: Alert and oriented x3, extra-ocular muscles intact, sensation grossly intact.  HEENT: Normocephalic, atraumatic, pupils equal round reactive to light, neck supple, no masses, no lymphadenopathy, thyroid nonpalpable.  Skin: Warm and dry, no rashes. Cardiac: Regular rate and rhythm, no murmurs rubs or gallops, no lower extremity edema.  Respiratory: Clear to auscultation bilaterally. Not using accessory muscles, speaking in full sentences. Bilateral Knee: Normal to inspection with no erythema or effusion or obvious bony abnormalities. Palpation normal with no warmth or joint line tenderness or patellar tenderness or condyle tenderness. ROM normal in flexion and extension and lower leg rotation. Ligaments with solid consistent endpoints including ACL, PCL, LCL, MCL. Negative Mcmurray's and provocative meniscal tests. Non painful patellar compression. Patellar and quadriceps tendons unremarkable. Hamstring and quadriceps strength is normal.  Impression and Recommendations:

## 2014-06-30 DIAGNOSIS — T8189XA Other complications of procedures, not elsewhere classified, initial encounter: Secondary | ICD-10-CM | POA: Diagnosis not present

## 2014-06-30 DIAGNOSIS — S51001A Unspecified open wound of right elbow, initial encounter: Secondary | ICD-10-CM | POA: Diagnosis not present

## 2014-07-11 ENCOUNTER — Ambulatory Visit (INDEPENDENT_AMBULATORY_CARE_PROVIDER_SITE_OTHER): Payer: BLUE CROSS/BLUE SHIELD | Admitting: Sports Medicine

## 2014-07-11 ENCOUNTER — Other Ambulatory Visit: Payer: Self-pay | Admitting: Sports Medicine

## 2014-07-11 ENCOUNTER — Ambulatory Visit (INDEPENDENT_AMBULATORY_CARE_PROVIDER_SITE_OTHER): Payer: BLUE CROSS/BLUE SHIELD

## 2014-07-11 ENCOUNTER — Encounter: Payer: Self-pay | Admitting: Sports Medicine

## 2014-07-11 VITALS — BP 131/67 | HR 60

## 2014-07-11 DIAGNOSIS — F0151 Vascular dementia with behavioral disturbance: Secondary | ICD-10-CM

## 2014-07-11 DIAGNOSIS — M47896 Other spondylosis, lumbar region: Secondary | ICD-10-CM

## 2014-07-11 DIAGNOSIS — M25551 Pain in right hip: Secondary | ICD-10-CM

## 2014-07-11 DIAGNOSIS — M5136 Other intervertebral disc degeneration, lumbar region: Secondary | ICD-10-CM

## 2014-07-11 DIAGNOSIS — S32601A Unspecified fracture of right ischium, initial encounter for closed fracture: Secondary | ICD-10-CM | POA: Insufficient documentation

## 2014-07-11 DIAGNOSIS — F01518 Vascular dementia, unspecified severity, with other behavioral disturbance: Secondary | ICD-10-CM

## 2014-07-11 NOTE — Progress Notes (Signed)
  Subjective:    CC: Hip pain  HPI: This is a pleasant 81 year old male with failure to thrive comes in after taking a misstep and nearly falling, and then having subsequent pain in the posterior pelvis and hip. Pain does not radiate, it is severe, persistent, he is having difficulty bearing weight. Nothing radicular. He also endorses increasing confusion and falls.  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  Past medical history, Surgical history, Family history not pertinant except as noted below, Social history, Allergies, and medications have been entered into the medical record, reviewed, and no changes needed.   Review of Systems: No fevers, chills, night sweats, weight loss, chest pain, or shortness of breath.   Objective:    General: Well Developed, well nourished, and in no acute distress.  Neuro: Alert and oriented x3, extra-ocular muscles intact, sensation grossly intact.  HEENT: Normocephalic, atraumatic, pupils equal round reactive to light, neck supple, no masses, no lymphadenopathy, thyroid nonpalpable.  Skin: Warm and dry, no rashes. Cardiac: Regular rate and rhythm, no murmurs rubs or gallops, no lower extremity edema.  Respiratory: Clear to auscultation bilaterally. Not using accessory muscles, speaking in full sentences. Right Hip: ROM IR: 60 Deg, ER: 60 Deg, Flexion: 120 Deg, Extension: 100 Deg, Abduction: 45 Deg, Adduction: 45 Deg Strength IR: 5/5, ER: 5/5, Flexion: 5/5, Extension: 5/5, Abduction: 5/5, Adduction: 5/5 Pelvic alignment unremarkable to inspection and palpation. Standing hip rotation and gait without trendelenburg / unsteadiness. Greater trochanter without tenderness to palpation. Tender to palpation over the right lateral sacrum, piriformis, and ischium No SI joint tenderness and normal minimal SI movement.  Impression  and Recommendations:

## 2014-07-11 NOTE — Assessment & Plan Note (Addendum)
Unfortunately this recently occurred after a missed step and I do have a suspicion for a pelvic fracture. X-rays, he does need an MRI, if we do see a pelvic fracture he will need to be nonweightbearing in a skilled nursing facility. In the meantime I would like him to also work with a home health nurse. Overall seems is a one quarter tramadol is effective for his pain.  MRI does confirm a fracture of the right ischium that is nondisplaced and incomplete. Treatment will revolve around limited weightbearing, initially complete nonweightbearing with only minimal weightbearing on the left side with gentle advance to weightbearing as tolerated on the right. Calcium and vitamin D supplement twice a day. I did not bill a fracture code, we will do this at the next visit.

## 2014-07-12 LAB — URINALYSIS
Bilirubin Urine: NEGATIVE
Glucose, UA: NEGATIVE mg/dL
Hgb urine dipstick: NEGATIVE
Ketones, ur: NEGATIVE mg/dL
Leukocytes, UA: NEGATIVE
Nitrite: NEGATIVE
Protein, ur: NEGATIVE mg/dL
Specific Gravity, Urine: 1.01 (ref 1.005–1.030)
Urobilinogen, UA: 0.2 mg/dL (ref 0.0–1.0)
pH: 5 (ref 5.0–8.0)

## 2014-07-14 ENCOUNTER — Telehealth: Payer: Self-pay

## 2014-07-14 ENCOUNTER — Other Ambulatory Visit: Payer: Self-pay | Admitting: Family Medicine

## 2014-07-14 DIAGNOSIS — S51001A Unspecified open wound of right elbow, initial encounter: Secondary | ICD-10-CM | POA: Diagnosis not present

## 2014-07-14 DIAGNOSIS — T8189XA Other complications of procedures, not elsewhere classified, initial encounter: Secondary | ICD-10-CM | POA: Diagnosis not present

## 2014-07-14 NOTE — Telephone Encounter (Signed)
PA MR Pelvis without contrast - Approved [id number]

## 2014-07-17 ENCOUNTER — Ambulatory Visit (INDEPENDENT_AMBULATORY_CARE_PROVIDER_SITE_OTHER): Payer: BLUE CROSS/BLUE SHIELD

## 2014-07-17 DIAGNOSIS — W19XXXA Unspecified fall, initial encounter: Secondary | ICD-10-CM

## 2014-07-17 DIAGNOSIS — S32691A Other specified fracture of right ischium, initial encounter for closed fracture: Secondary | ICD-10-CM

## 2014-07-17 DIAGNOSIS — M25551 Pain in right hip: Secondary | ICD-10-CM

## 2014-07-18 ENCOUNTER — Telehealth: Payer: Self-pay

## 2014-07-18 DIAGNOSIS — S32601A Unspecified fracture of right ischium, initial encounter for closed fracture: Secondary | ICD-10-CM

## 2014-07-18 NOTE — Telephone Encounter (Signed)
Called gave patient spouse Xray results. Patient wife has some concerns as to if patient should be put on bed rest or what his limitations are, she wants to know if he was able to go to any of his other appts. Please advise patient spouse. Rhonda Cunningham,CMA

## 2014-07-18 NOTE — Telephone Encounter (Signed)
Spoke to patient spouse she stated that she already has home health coming out but she would like a referral for [facility] for physical therapy and make sure its documented what they can do and what they cannot do. Adryan Druckenmiller,CMA

## 2014-07-18 NOTE — Telephone Encounter (Signed)
For all intensive purposes yes he should be on bed rest, he can get up to use the bathroom but should minimize weightbearing if at all possible on the right extremity. I am happy to get a physical therapist to come out to the house to help move his legs while he is in bed, we will do this for approximately one month while the ischial fracture heals. We know if they would like me to set up a home health physical therapist.   Also yes he may go to his other doctors visits but weightbearing should be minimized.

## 2014-07-18 NOTE — Telephone Encounter (Signed)
Referral placed.

## 2014-07-20 NOTE — Telephone Encounter (Signed)
Patient wife has been informed. Rhonda Cunningham,CMA

## 2014-07-21 ENCOUNTER — Encounter (HOSPITAL_BASED_OUTPATIENT_CLINIC_OR_DEPARTMENT_OTHER): Payer: BLUE CROSS/BLUE SHIELD | Attending: Internal Medicine

## 2014-07-22 ENCOUNTER — Other Ambulatory Visit: Payer: Self-pay | Admitting: Family Medicine

## 2014-07-22 NOTE — Telephone Encounter (Signed)
Patient has a fracture. Refilled medications for 2 months.

## 2014-07-24 ENCOUNTER — Telehealth: Payer: Self-pay

## 2014-07-24 NOTE — Telephone Encounter (Signed)
I had brought this up previously but often times we have to get patient's into skilled nursing facilities for a brief period of time. Let me know if she would like me to work on setting this up.

## 2014-07-24 NOTE — Telephone Encounter (Signed)
Patient wife called stated that she is really feeling overwhelmed, patient has appts to see wound doctor and she want some advise on how she should be handling him or caring for him being non weight bearing and bed rest for a month, she wants to know what she can use to lift him to get him to the restroom. I gave the patient spouse information from last phone call, please advise patient on what to do. Rhonda Cunningham,CMA

## 2014-07-25 ENCOUNTER — Telehealth: Payer: Self-pay | Admitting: Family Medicine

## 2014-07-25 NOTE — Telephone Encounter (Signed)
Mrs. Haywood called. She wants to know if it is okay for her to transfer her husband from bed to chair for him to sit up for a while. Her overall concern is not knowing what she is allowed to do.  Thank you.

## 2014-07-25 NOTE — Telephone Encounter (Signed)
Spoke to patient spouse and she has been informed, patient declined any skilled nursing at this time. Britton Perkinson,CMA

## 2014-07-25 NOTE — Telephone Encounter (Signed)
Yes that is okay,  Bathing is appropriate as well, weightbearing should simply be minimized

## 2014-07-28 ENCOUNTER — Ambulatory Visit: Payer: BLUE CROSS/BLUE SHIELD | Admitting: Sports Medicine

## 2014-08-09 ENCOUNTER — Telehealth: Payer: Self-pay

## 2014-08-09 NOTE — Telephone Encounter (Signed)
Wife called to see if he could go to the dentist because he broke his tooth. I advised yes and stated he is just limited weightbearing.

## 2014-08-09 NOTE — Telephone Encounter (Signed)
Excellent, thank you!

## 2014-08-15 ENCOUNTER — Telehealth: Payer: Self-pay

## 2014-08-15 DIAGNOSIS — S51001S Unspecified open wound of right elbow, sequela: Secondary | ICD-10-CM

## 2014-08-15 NOTE — Telephone Encounter (Signed)
Left message on patient vm for spouse to call me back. Shakura Cowing,CMA

## 2014-08-15 NOTE — Telephone Encounter (Signed)
Wound VAC is a good idea, particularly if the topical colloid or Mepilex type dressings are not working, keep in mind that these type of chronic wounds/fistulas in relatively malnourished patients can take a long time to heal even with the wound VAC.

## 2014-08-15 NOTE — Telephone Encounter (Signed)
Patient spouse has been informed. Alberta Cairns,CMA  

## 2014-08-15 NOTE — Telephone Encounter (Signed)
Referral placed.

## 2014-08-15 NOTE — Telephone Encounter (Signed)
Patient wife called stated that wound nurse came out to see the patient and she stated that there is a increase in the underlying of the wound up to 1 cm the nurse wants to do a wound vac and wanted to know if there was anything else that you would like to add. Maebell Lyvers,CMA

## 2014-08-15 NOTE — Telephone Encounter (Signed)
Spoke to patient spouse gave her information as noted below. A order (referral) is needed in order for the wound nurse to get the wound vac and put it on the patient. Please advise Stanley Meyers. Rhonda Cunningham,CMA

## 2014-08-28 ENCOUNTER — Ambulatory Visit (INDEPENDENT_AMBULATORY_CARE_PROVIDER_SITE_OTHER): Payer: BLUE CROSS/BLUE SHIELD | Admitting: Sports Medicine

## 2014-08-28 ENCOUNTER — Encounter: Payer: Self-pay | Admitting: Sports Medicine

## 2014-08-28 VITALS — BP 83/45 | HR 60 | Wt 127.0 lb

## 2014-08-28 DIAGNOSIS — M7021 Olecranon bursitis, right elbow: Secondary | ICD-10-CM

## 2014-08-28 DIAGNOSIS — R627 Adult failure to thrive: Secondary | ICD-10-CM

## 2014-08-28 DIAGNOSIS — S32601S Unspecified fracture of right ischium, sequela: Secondary | ICD-10-CM

## 2014-08-28 MED ORDER — MEGESTROL ACETATE 400 MG/10ML PO SUSP: 400.0000 mg | Freq: Two times a day (BID) | ORAL | Status: DC

## 2014-08-28 NOTE — Assessment & Plan Note (Signed)
There is a persistent fistula despite olecranon bursectomy in the operating room. I think this is in part for malnutrition, I think he is getting good wound care. We are going to start Megace in addition to his mirtazapine. I would also like him to start a wound VAC.

## 2014-08-28 NOTE — Assessment & Plan Note (Signed)
Clinically resolved after 6 weeks of mostly nonweightbearing.

## 2014-08-28 NOTE — Progress Notes (Signed)
  Subjective:    CC: Follow-up  HPI: Stanley Meyers is now well outside of his right ischial bone fracture, he has been nonweightbearing, and reports pain relief.  Chronic right olecranon bursitis: He is post surgical excision of the bursa with what seems to be a chronically nonhealing wound/fistula. He has been doing wound care, and at this point they are considering a wound VAC placement although the wound is quite small.  Failure to thrive: Stabilized on Remeron, had some altered mental status on dronabinol, amenable to try Megace.  Past medical history, Surgical history, Family history not pertinant except as noted below, Social history, Allergies, and medications have been entered into the medical record, reviewed, and no changes needed.   Review of Systems: No fevers, chills, night sweats, weight loss, chest pain, or shortness of breath.   Objective:    General: Well Developed, well nourished, and in no acute distress.  Neuro: Alert and oriented x3, extra-ocular muscles intact, sensation grossly intact.  HEENT: Normocephalic, atraumatic, pupils equal round reactive to light, neck supple, no masses, no lymphadenopathy, thyroid nonpalpable.  Skin: Warm and dry, no rashes. Cardiac: Regular rate and rhythm, no murmurs rubs or gallops, no lower extremity edema.  Respiratory: Clear to auscultation bilaterally. Not using accessory muscles, speaking in full sentences. Right elbow: There continues to be a 1 cm open lesion with minimal drainage, no erythema, no signs of bacterial infection, no malodorous discharge, but no granulation tissue visible. Pelvis: Able to ambulate appropriately without any pain.  Impression and Recommendations:

## 2014-08-28 NOTE — Assessment & Plan Note (Signed)
Stabilizing of weight loss with mirtazapine, Marinol created some altered mental status. I am going to add Megace. Return in a month.

## 2014-08-30 ENCOUNTER — Telehealth: Payer: Self-pay

## 2014-08-30 NOTE — Telephone Encounter (Signed)
Received fax from [facility] for PA on Megestrol Acet sent through cover my meds and received a does not require PA response. - CF

## 2014-08-31 ENCOUNTER — Encounter (HOSPITAL_BASED_OUTPATIENT_CLINIC_OR_DEPARTMENT_OTHER): Payer: BLUE CROSS/BLUE SHIELD | Attending: Internal Medicine

## 2014-08-31 DIAGNOSIS — M71021 Abscess of bursa, right elbow: Secondary | ICD-10-CM | POA: Insufficient documentation

## 2014-09-08 ENCOUNTER — Encounter (HOSPITAL_BASED_OUTPATIENT_CLINIC_OR_DEPARTMENT_OTHER): Payer: Self-pay | Admitting: *Deleted

## 2014-09-08 ENCOUNTER — Emergency Department (HOSPITAL_BASED_OUTPATIENT_CLINIC_OR_DEPARTMENT_OTHER)
Admission: EM | Admit: 2014-09-08 | Discharge: 2014-09-08 | Disposition: A | Discharge status: Home / Self Care | Payer: Medicare Other | Attending: Emergency Medicine | Admitting: Emergency Medicine

## 2014-09-08 DIAGNOSIS — Z79818 Long term (current) use of other agents affecting estrogen receptors and estrogen levels: Secondary | ICD-10-CM | POA: Diagnosis not present

## 2014-09-08 DIAGNOSIS — R197 Diarrhea, unspecified: Secondary | ICD-10-CM | POA: Diagnosis present

## 2014-09-08 DIAGNOSIS — Z87448 Personal history of other diseases of urinary system: Secondary | ICD-10-CM | POA: Insufficient documentation

## 2014-09-08 DIAGNOSIS — Z87891 Personal history of nicotine dependence: Secondary | ICD-10-CM | POA: Diagnosis not present

## 2014-09-08 DIAGNOSIS — K219 Gastro-esophageal reflux disease without esophagitis: Secondary | ICD-10-CM | POA: Diagnosis not present

## 2014-09-08 DIAGNOSIS — F0390 Unspecified dementia without behavioral disturbance: Secondary | ICD-10-CM | POA: Insufficient documentation

## 2014-09-08 DIAGNOSIS — Z79899 Other long term (current) drug therapy: Secondary | ICD-10-CM | POA: Insufficient documentation

## 2014-09-08 DIAGNOSIS — E860 Dehydration: Secondary | ICD-10-CM | POA: Diagnosis not present

## 2014-09-08 DIAGNOSIS — I10 Essential (primary) hypertension: Secondary | ICD-10-CM | POA: Insufficient documentation

## 2014-09-08 HISTORY — DX: Diverticulosis of intestine, part unspecified, without perforation or abscess without bleeding: K57.90

## 2014-09-08 HISTORY — DX: Unspecified dementia, unspecified severity, without behavioral disturbance, psychotic disturbance, mood disturbance, and anxiety: F03.90

## 2014-09-08 LAB — BASIC METABOLIC PANEL
ANION GAP: 10 (ref 5–15)
BUN: 49 mg/dL — ABNORMAL HIGH (ref 6–23)
CALCIUM: 9.5 mg/dL (ref 8.4–10.5)
CO2: 20 mmol/L (ref 19–32)
CREATININE: 2.74 mg/dL — AB (ref 0.50–1.35)
Chloride: 110 mmol/L (ref 96–112)
GFR calc Af Amer: 22 mL/min — ABNORMAL LOW (ref >90)
GFR, EST NON AFRICAN AMERICAN: 19 mL/min — AB (ref >90)
Glucose, Bld: 96 mg/dL (ref 70–99)
Potassium: 4.1 mmol/L (ref 3.5–5.1)
SODIUM: 140 mmol/L (ref 135–145)

## 2014-09-08 LAB — CBC WITH DIFFERENTIAL/PLATELET
BASOS ABS: 0 10*3/uL (ref 0.0–0.1)
Basophils Relative: 0 % (ref 0–1)
Eosinophils Absolute: 0.1 10*3/uL (ref 0.0–0.7)
Eosinophils Relative: 2 % (ref 0–5)
HEMATOCRIT: 34.7 % — AB (ref 39.0–52.0)
HEMOGLOBIN: 10.7 g/dL — AB (ref 13.0–17.0)
LYMPHS PCT: 16 % (ref 12–46)
Lymphs Abs: 1.2 10*3/uL (ref 0.7–4.0)
MCH: 29.9 pg (ref 26.0–34.0)
MCHC: 30.8 g/dL (ref 30.0–36.0)
MCV: 96.9 fL (ref 78.0–100.0)
MONO ABS: 1.1 10*3/uL — AB (ref 0.1–1.0)
MONOS PCT: 14 % — AB (ref 3–12)
NEUTROS ABS: 5.2 10*3/uL (ref 1.7–7.7)
Neutrophils Relative %: 68 % (ref 43–77)
Platelets: 139 10*3/uL — ABNORMAL LOW (ref 150–400)
RBC: 3.58 MIL/uL — AB (ref 4.22–5.81)
RDW: 16.2 % — ABNORMAL HIGH (ref 11.5–15.5)
WBC: 7.6 10*3/uL (ref 4.0–10.5)

## 2014-09-08 MED ORDER — SODIUM CHLORIDE 0.9 % IV BOLUS (SEPSIS)
1000.0000 mL | Freq: Once | INTRAVENOUS | Status: AC
  Administered 2014-09-08: 1000 mL via INTRAVENOUS

## 2014-09-08 NOTE — ED Notes (Signed)
Pt c/o diarrhea since yesterday in spite of imodium. He vomited once this morning. Wife denies fever.

## 2014-09-08 NOTE — ED Provider Notes (Signed)
CSN: [id number]     Arrival date & time 09/08/14  1642 History   First MD Initiated Contact with Patient 09/08/14 1703     Chief Complaint  Patient presents with  . Diarrhea     (Consider location/radiation/quality/duration/timing/severity/associated sxs/prior Treatment) HPI  81 year old male presents with diarrhea since yesterday. Occasionally he ends up having loose stools that require him to be treated with Imodium. However today the stools have significantly increases had at least 10 loose brown stools. No blood or mucus. No abdominal pain. He vomited once this morning but no vomiting since. Is able to keep down fluids but seems to go right through him. No recent antibiotics or travel. No sick contacts. Has taken at least 6 Imodium today.  Past Medical History  Diagnosis Date  . Kidney failure   . GERD (gastroesophageal reflux disease)   . Hypertension   . Hyperlipemia   . Spasmatic colon   . Raynaud's disease   . Diverticulosis   . Dementia    Past Surgical History  Procedure Laterality Date  . Cholecystectomy    . Hernia repair    . Av fistula placement     No family history on file. History  Substance Use Topics  . Smoking status: Former Research scientist (life sciences)  . Smokeless tobacco: Never Used  . Alcohol Use: 1.2 oz/week    2 Glasses of wine per week    Review of Systems  Constitutional: Negative for fever and chills.  Gastrointestinal: Positive for vomiting and diarrhea. Negative for abdominal pain and blood in stool.  Genitourinary: Negative for decreased urine volume.  Musculoskeletal: Negative for back pain.  All other systems reviewed and are negative.     Allergies  Gabapentin; Linzess; and Tramadol  Home Medications   Prior to Admission medications   Medication Sig Start Date End Date Taking? Authorizing Provider  acetaminophen (TYLENOL) 325 MG tablet Take 2 tablets (650 mg total) by mouth every 6 (six) hours as needed. 05/07/14   Merryl Hacker, MD   AMBULATORY NON FORMULARY MEDICATION Right scooter with handlebars 06/26/14   Silverio Decamp, MD  dicyclomine (BENTYL) 10 MG capsule Take 5 mg by mouth 4 (four) times daily -  before meals and at bedtime.    Historical Provider, MD  donepezil (ARICEPT) 5 MG tablet TAKE 1 TABLET AT BEDTIME 04/04/14   Hali Marry, MD  famotidine (PEPCID) 40 MG tablet  12/04/12   Historical Provider, MD  furosemide (LASIX) 20 MG tablet TAKE 2 TABLETS (40 MG TOTAL) BY MOUTH DAILY. 07/22/14   Hali Marry, MD  megestrol (MEGACE) 400 MG/10ML suspension Take 10 mLs (400 mg total) by mouth 2 (two) times daily. 08/28/14   Silverio Decamp, MD  mirtazapine (REMERON) 15 MG tablet TAKE 1 TABLET (15 MG TOTAL) BY MOUTH AT BEDTIME. 07/22/14   Hali Marry, MD  pantoprazole (PROTONIX) 40 MG tablet Take 1 tablet (40 mg total) by mouth daily. 09/27/12   Deneise Lever, MD  PENNSAID 2 % SOLN APPLY TO AFFECTED AREA TWICE A DAY 06/14/14   Silverio Decamp, MD   BP 111/53 mmHg  Pulse 86  Temp(Src) 98.4 F (36.9 C) (Oral)  Resp 18  Ht 5\' 7"  (1.702 m)  Wt 127 lb (57.607 kg)  BMI 19.89 kg/m2  SpO2 94% Physical Exam  Constitutional: He is oriented to person, place, and time. He appears well-developed and well-nourished. No distress.  HENT:  Head: Normocephalic and atraumatic.  Right Ear: External ear normal.  Left Ear: External ear normal.  Nose: Nose normal.  Eyes: Right eye exhibits no discharge. Left eye exhibits no discharge.  Neck: Neck supple.  Cardiovascular: Normal rate, regular rhythm, normal heart sounds and intact distal pulses.   Pulmonary/Chest: Effort normal and breath sounds normal.  Abdominal: Soft. There is no tenderness.  Musculoskeletal: He exhibits no edema.  Neurological: He is alert and oriented to person, place, and time.  Skin: Skin is warm and dry. He is not diaphoretic.  Nursing note and vitals reviewed.   ED Course  Procedures (including critical care  time) Labs Review Labs Reviewed  BASIC METABOLIC PANEL - Abnormal; Notable for the following:    BUN 49 (*)    Creatinine, Ser 2.74 (*)    GFR calc non Af Amer 19 (*)    GFR calc Af Amer 22 (*)    All other components within normal limits  CBC WITH DIFFERENTIAL/PLATELET - Abnormal; Notable for the following:    RBC 3.58 (*)    Hemoglobin 10.7 (*)    HCT 34.7 (*)    RDW 16.2 (*)    Platelets 139 (*)    Monocytes Relative 14 (*)    Monocytes Absolute 1.1 (*)    All other components within normal limits  STOOL CULTURE  CLOSTRIDIUM DIFFICILE BY PCR    Imaging Review No results found.   EKG Interpretation None      MDM   Final diagnoses:  Diarrhea  Dehydration    Patient is able to tolerate oral fluids, has no abdominal pain. Appears well. Feels better after IV fluids. Does have a mild increase in his creatinine from 2.2 to 2.7. I discussed this with patient and family and after discussion about observation admission versus going home, they prefer to go home. Patient is able to eat and drink. Has no risk factors C. difficile, will send stool for culture and C. difficile and if positive he will need treatment but at this point I have low suspicion. Stable for discharge, if he appears to be giving more dehydrated family was told to come back to the ER.    Sherwood Gambler, MD 09/09/14 [phone]

## 2014-09-12 ENCOUNTER — Other Ambulatory Visit: Payer: Self-pay | Admitting: Family Medicine

## 2014-09-12 ENCOUNTER — Encounter: Payer: Self-pay | Admitting: Family Medicine

## 2014-09-12 ENCOUNTER — Ambulatory Visit (INDEPENDENT_AMBULATORY_CARE_PROVIDER_SITE_OTHER): Payer: BLUE CROSS/BLUE SHIELD | Admitting: Family Medicine

## 2014-09-12 ENCOUNTER — Other Ambulatory Visit: Payer: Self-pay | Admitting: Sports Medicine

## 2014-09-12 VITALS — BP 122/60 | HR 68 | Temp 97.7°F | Wt 130.0 lb

## 2014-09-12 DIAGNOSIS — D649 Anemia, unspecified: Secondary | ICD-10-CM | POA: Diagnosis not present

## 2014-09-12 DIAGNOSIS — R109 Unspecified abdominal pain: Secondary | ICD-10-CM | POA: Diagnosis not present

## 2014-09-12 DIAGNOSIS — R5383 Other fatigue: Secondary | ICD-10-CM

## 2014-09-12 DIAGNOSIS — J449 Chronic obstructive pulmonary disease, unspecified: Secondary | ICD-10-CM | POA: Diagnosis not present

## 2014-09-12 MED ORDER — TIOTROPIUM BROMIDE MONOHYDRATE 2.5 MCG/ACT IN AERS: INHALATION_SPRAY | RESPIRATORY_TRACT | Status: DC

## 2014-09-12 NOTE — Telephone Encounter (Signed)
Sent to pharmacy in [place] as requested, we may need to switch to [facility] in [place] if not covered.

## 2014-09-12 NOTE — Progress Notes (Signed)
CC: Stanley Meyers is a 81 y.o. male is here for f/u ED visit   Subjective: HPI:  On Thursday of last week he began to have frequent loose stools up to 10 bowel movements a day, on Friday he began to vomit so he was taken to a local emergency room. He was found to have a mild anemia and a mild increase in his creatinine however accompanied fatigue was resolved after getting 2 L of normal saline. They had stool samples for C. Difficile however the samples were destroyed by accident before making it to the lab per his wife's report. Frequency of bowel movements decreased by Saturday and vomiting not occur after the emergency room visit.  Through all of this he has not had any fevers, chills,  Or shortness of breath. He feels like he is back to his normal state of health with a great appetite and a little bit of constipation but no nausea or vomiting. His wife states that he seems to still feel fatigued all hours of the day and is having difficulty with transferring without assistance which is out of character for him.   His only major complaint today is that he's had a productive cough that's been present for matter of years that no one seems to have been able to help control.he was once on an inhaler that did not provide any benefit so they've figured that there is nothing else to be done about the cough. There is no blood in the sputum and he denies any chest pain.   Review Of Systems Outlined In HPI  Past Medical History  Diagnosis Date  . Kidney failure   . GERD (gastroesophageal reflux disease)   . Hypertension   . Hyperlipemia   . Spasmatic colon   . Raynaud's disease   . Diverticulosis   . Dementia     Past Surgical History  Procedure Laterality Date  . Cholecystectomy    . Hernia repair    . Av fistula placement     No family history on file.  History   Social History  . Marital Status: Married    Spouse Name: Vira Agar  . Number of Children: N/A  . Years of Education: N/A    Occupational History  . Retired     Social History Main Topics  . Smoking status: Former Research scientist (life sciences)  . Smokeless tobacco: Never Used  . Alcohol Use: 1.2 oz/week    2 Glasses of wine per week  . Drug Use: No  . Sexual Activity: Not on file   Other Topics Concern  . Not on file   Social History Narrative     Objective: BP 122/60 mmHg  Pulse 68  Temp(Src) 97.7 F (36.5 C) (Oral)  Wt 130 lb (58.968 kg)  General:alert, pleasantly demented, No Acute Distress HEENT: Pupils equal, round, reactive to light. Conjunctivae clear.  External ears unremarkable, canals clear with intact TMs with appropriate landmarks.  Middle ear appears open without effusion. Pink inferior turbinates.  Moist mucous membranes, pharynx without inflammation nor lesions.  Neck supple without palpable lymphadenopathy nor abnormal masses. Lungs: Clear to auscultation bilaterally, no wheezing/ronchi/rales.  Comfortable work of breathing. Good air movement. Cardiac: Regular rate and rhythm. Normal S1/S2.  Grade 3/6 holosystolic murmur, rubs, nor gallops.   Abdomen: soft nontender Extremities: No peripheral edema.  Strong peripheral pulses.  Mental Status: No depression, anxiety, nor agitation. Skin: Warm and dry.  Assessment & Plan: Stanley Meyers was seen today for f/u ed visit.  Diagnoses and all orders for this visit:  Anemia, unspecified anemia type Orders: -     B12 -     Folate -     Iron -     Vit D  25 hydroxy (rtn osteoporosis monitoring) -     TSH -     Clostridium difficile EIA -     H. pylori antibody, IgG  Abdominal pain, unspecified abdominal location Orders: -     B12 -     Folate -     Iron -     Vit D  25 hydroxy (rtn osteoporosis monitoring) -     TSH -     Clostridium difficile EIA -     H. pylori antibody, IgG  Other fatigue Orders: -     B12 -     Folate -     Iron -     Vit D  25 hydroxy (rtn osteoporosis monitoring) -     TSH -     Clostridium difficile EIA -     H. pylori  antibody, IgG  COPD (chronic obstructive pulmonary disease) with chronic bronchitis   Fatigue: Further working up anemia looking for deficiencies listed above. Rule out hypothyroidism or vitamin D deficiency. Abdominal pain: Resolved however wife is specifically requesting C. Difficile to be tested along with a test for H. Pylori due to the request of multiple family members that work in the medical field. Discussed with her that my suspicion of these being positive is low however I be happy to order these tests to rule out all possibilities of his discomfort that happened last week. COPD: Uncontrolled, it doesn't appear that is ever tried Spiriva he was given a sample today and it beneficial call back for formal prescription  40 minutes spent face-to-face during visit today of which at least 50% was counseling or coordinating care regarding: 1. Anemia, unspecified anemia type   2. Abdominal pain, unspecified abdominal location   3. Other fatigue   4. COPD (chronic obstructive pulmonary disease) with chronic bronchitis        Return if symptoms worsen or fail to improve.

## 2014-09-12 NOTE — Addendum Note (Signed)
Addended by: Isaias Cowman C on: 09/12/2014 05:00 PM   Modules accepted: Orders

## 2014-09-13 ENCOUNTER — Ambulatory Visit: Payer: BLUE CROSS/BLUE SHIELD | Admitting: Family Medicine

## 2014-09-13 ENCOUNTER — Telehealth: Payer: Self-pay | Admitting: Family Medicine

## 2014-09-13 LAB — TSH: TSH: 1.17 u[IU]/mL (ref 0.350–4.500)

## 2014-09-13 LAB — IRON: Iron: 31 ug/dL — ABNORMAL LOW (ref 42–165)

## 2014-09-13 LAB — VITAMIN B12: VITAMIN B 12: 439 pg/mL (ref 211–911)

## 2014-09-13 LAB — VITAMIN D 25 HYDROXY (VIT D DEFICIENCY, FRACTURES): Vit D, 25-Hydroxy: 38 ng/mL (ref 30–100)

## 2014-09-13 LAB — FOLATE: Folate: 8.9 ng/mL

## 2014-09-13 MED ORDER — FERROUS SULFATE 325 (65 FE) MG PO TBEC: 325.0000 mg | DELAYED_RELEASE_TABLET | Freq: Two times a day (BID) | ORAL | Status: DC

## 2014-09-13 NOTE — Telephone Encounter (Signed)
Seth Bake, Will you please let patient's wife know that his iron level was low which could be contributing to his anemia and fatigue.  All other blood work was normal.  I'd recommend staring an OTC Iron Sulfate supplement at a dose of 325mg  twice a day with meals.  I will route this to Dr. Jerilynn Mages as well as a Juluis Rainier.

## 2014-09-13 NOTE — Telephone Encounter (Signed)
Pt's spouse notified.

## 2014-09-14 ENCOUNTER — Telehealth: Payer: Self-pay | Admitting: *Deleted

## 2014-09-14 MED ORDER — ONDANSETRON 4 MG PO TBDP: 4.0000 mg | ORAL_TABLET | Freq: Three times a day (TID) | ORAL | Status: DC | PRN

## 2014-09-14 NOTE — Telephone Encounter (Signed)
Called and LMOM. Called in some Phenergan for nausea. If vomits again then go to ED but if not then came come in first thing in AM.

## 2014-09-14 NOTE — Telephone Encounter (Signed)
Patients wife called again. States the Pt has vomited 3 times today and had 4 loose stools. She was able to give him some Pedialyte and a small amount of water which he kept down. Attempted to give Pt a couple saltine crackers which came right back up. Please advise if she needs to take Pt back to ER or if we should add Pt on to an open acute visit slot tomorrow. Pt's wife did state she dropped off a stool sample at the lab this morning for testing.

## 2014-09-14 NOTE — Telephone Encounter (Signed)
Spouse called and states pt just started vominting again and also has diarrhea. They did submit a stool sample this am to the lab. Spouse wants to know if there is anything else to do at this point besides waiting on the test results.please advise

## 2014-09-15 ENCOUNTER — Ambulatory Visit (INDEPENDENT_AMBULATORY_CARE_PROVIDER_SITE_OTHER): Payer: BLUE CROSS/BLUE SHIELD | Admitting: Family Medicine

## 2014-09-15 ENCOUNTER — Encounter: Payer: Self-pay | Admitting: Family Medicine

## 2014-09-15 ENCOUNTER — Encounter (HOSPITAL_BASED_OUTPATIENT_CLINIC_OR_DEPARTMENT_OTHER): Payer: BLUE CROSS/BLUE SHIELD | Attending: Internal Medicine

## 2014-09-15 VITALS — BP 95/53 | HR 71 | Wt 128.0 lb

## 2014-09-15 DIAGNOSIS — F01518 Vascular dementia, unspecified severity, with other behavioral disturbance: Secondary | ICD-10-CM

## 2014-09-15 DIAGNOSIS — F0151 Vascular dementia with behavioral disturbance: Secondary | ICD-10-CM

## 2014-09-15 DIAGNOSIS — S51001D Unspecified open wound of right elbow, subsequent encounter: Secondary | ICD-10-CM | POA: Insufficient documentation

## 2014-09-15 DIAGNOSIS — Y838 Other surgical procedures as the cause of abnormal reaction of the patient, or of later complication, without mention of misadventure at the time of the procedure: Secondary | ICD-10-CM | POA: Insufficient documentation

## 2014-09-15 DIAGNOSIS — R197 Diarrhea, unspecified: Secondary | ICD-10-CM

## 2014-09-15 DIAGNOSIS — T8189XD Other complications of procedures, not elsewhere classified, subsequent encounter: Secondary | ICD-10-CM | POA: Insufficient documentation

## 2014-09-15 LAB — C. DIFFICILE GDH AND TOXIN A/B
C. DIFF TOXIN A/B: NOT DETECTED
C. difficile GDH: NOT DETECTED

## 2014-09-15 LAB — HELICOBACTER PYLORI  SPECIAL ANTIGEN: H. PYLORI Antigen: NEGATIVE

## 2014-09-15 MED ORDER — DIPHENOXYLATE-ATROPINE 2.5-0.025 MG PO TABS: 1.0000 | ORAL_TABLET | Freq: Four times a day (QID) | ORAL | Status: DC | PRN

## 2014-09-15 NOTE — Patient Instructions (Signed)
Stop Aricept, begin zofran every 8 hours even if feeling well for the next three days.  Begin lomotil today.

## 2014-09-15 NOTE — Progress Notes (Signed)
CC: Stanley Meyers is a 81 y.o. male is here for Acute Visit   Subjective: HPI:  Late Wednesday night patient began to feel nauseous and had an episode of vomiting followed by loose bowel movements that have persisted up until this morning. Wife reports that he is bowel movements are described as liquid, brown, occurring every hour however for the past 2 hours has not had a bowel movement. He had vomiting Wednesday and yesterday but not today. He tells me he feels "pretty good". Wife and son state that he has not complained about any fevers, chills, abdominal pain, no confusion beyond his baseline dementia. Interventions have included Imodium without much benefit. He still has thirst but there has been some decreased appetite. He's had episodes like this occurring one or 2 times a month for the past "many months" and multiple labs have been unremarkable, C. Difficile was normal from earlier this week. He has these episodes he has no other accompanying complaints other than diarrhea and vomiting.  Review Of Systems Outlined In HPI  Past Medical History  Diagnosis Date  . Kidney failure   . GERD (gastroesophageal reflux disease)   . Hypertension   . Hyperlipemia   . Spasmatic colon   . Raynaud's disease   . Diverticulosis   . Dementia     Past Surgical History  Procedure Laterality Date  . Cholecystectomy    . Hernia repair    . Av fistula placement     No family history on file.  History   Social History  . Marital Status: Married    Spouse Name: Stanley Meyers  . Number of Children: N/A  . Years of Education: N/A   Occupational History  . Retired     Social History Main Topics  . Smoking status: Former Research scientist (life sciences)  . Smokeless tobacco: Never Used  . Alcohol Use: 1.2 oz/week    2 Glasses of wine per week  . Drug Use: No  . Sexual Activity: Not on file   Other Topics Concern  . Not on file   Social History Narrative     Objective: BP 95/53 mmHg  Pulse 71  Wt 128 lb (58.06  kg)  SpO2 94%  General: alert oriented to time and place, No Acute Distress HEENT: Pupils equal, round, reactive to light. Conjunctiva clear with moist mucous membranes. Lungs: Clear to auscultation bilaterally, no wheezing/ronchi/rales.  Comfortable work of breathing. Good air movement. Cardiac: Regular rate and rhythm. Normal S1/S2.  No murmurs, rubs, nor gallops.   Abdomen: soft nontender no rebound or guarding. No rigidity. No palpable abnormalities. Extremities: No peripheral edema.  Strong peripheral pulses.  Mental Status: No depression, anxiety, nor agitation. Skin: Warm and dry.  Assessment & Plan: Stanley Meyers was seen today for acute visit.  Diagnoses and all orders for this visit:  Diarrhea  Dementia, vascular, mixed, with behavioral disturbance  Other orders -     diphenoxylate-atropine (LOMOTIL) 2.5-0.025 MG per tablet; Take 1 tablet by mouth 4 (four) times daily as needed for diarrhea or loose stools.  Lack of accompanying symptoms with his diarrhea and vomiting episodes do not point to any clear etiology of his episodes. Low suspicion of bacterial involvement. I've advised him to stop taking Aricept, begin as needed Lomotil and schedule Zofran every 8 hours over the weekend. Stopped taking Aricept indefinitely to see if this was the culprit for his episodes.Signs and symptoms requring emergent/urgent reevaluation were discussed with the patient.  Return if symptoms worsen or fail  to improve.

## 2014-09-15 NOTE — Telephone Encounter (Signed)
Pt is still having vomiting/diarrhea. Added onto schedule in an acute slot with [facility] this afternoon for evaluation.

## 2014-09-16 ENCOUNTER — Other Ambulatory Visit: Payer: Self-pay | Admitting: Family Medicine

## 2014-09-18 ENCOUNTER — Other Ambulatory Visit: Payer: Self-pay | Admitting: Family Medicine

## 2014-09-21 ENCOUNTER — Telehealth: Payer: Self-pay | Admitting: *Deleted

## 2014-09-21 NOTE — Telephone Encounter (Signed)
Wife called and states they were advised to stop taking the aricept because this may have caused the diarrhea and vomiting. Wife states pt is now totally confused and is surprised at how his mental health has declined since being off the medication. Also he did vomit this am. Spouse wanted to know if he should start the med again.please advise

## 2014-09-22 DIAGNOSIS — T8189XD Other complications of procedures, not elsewhere classified, subsequent encounter: Secondary | ICD-10-CM | POA: Diagnosis present

## 2014-09-22 DIAGNOSIS — S51001D Unspecified open wound of right elbow, subsequent encounter: Secondary | ICD-10-CM | POA: Diagnosis present

## 2014-09-22 DIAGNOSIS — Y838 Other surgical procedures as the cause of abnormal reaction of the patient, or of later complication, without mention of misadventure at the time of the procedure: Secondary | ICD-10-CM | POA: Diagnosis not present

## 2014-09-22 NOTE — Telephone Encounter (Signed)
Yes go ahead and restart Aricept until he has follow up next week with Dr. Jerilynn Mages.  When I was talking to Dr. Jerilynn Mages about his situation last week she was also curious to see if the megestrol was contributing to the diarrhea and vomiitng episodes.  Knowing that, I'd recommend stopping the megestrol until f/u with Dr. Jerilynn Mages as well so as to try one more factor that might be contributing to the episodes.

## 2014-09-22 NOTE — Telephone Encounter (Signed)
Notified pt's son and he voiced understanding

## 2014-09-25 ENCOUNTER — Ambulatory Visit (INDEPENDENT_AMBULATORY_CARE_PROVIDER_SITE_OTHER): Payer: BLUE CROSS/BLUE SHIELD | Admitting: Sports Medicine

## 2014-09-25 ENCOUNTER — Encounter: Payer: Self-pay | Admitting: Sports Medicine

## 2014-09-25 VITALS — BP 106/61 | HR 68 | Wt 125.0 lb

## 2014-09-25 DIAGNOSIS — E860 Dehydration: Secondary | ICD-10-CM | POA: Diagnosis not present

## 2014-09-25 DIAGNOSIS — M7021 Olecranon bursitis, right elbow: Secondary | ICD-10-CM | POA: Diagnosis not present

## 2014-09-25 DIAGNOSIS — S32601S Unspecified fracture of right ischium, sequela: Secondary | ICD-10-CM

## 2014-09-25 DIAGNOSIS — F0150 Vascular dementia without behavioral disturbance: Secondary | ICD-10-CM | POA: Diagnosis not present

## 2014-09-25 NOTE — Assessment & Plan Note (Signed)
With a recent episode of nausea, vomiting, diarrhea. This may have been due to the Aricept or a viral infection. 2 L of IV fluid here in the office. He will see his PCP tomorrow. We may need to consider a topical acetylcholinesterase inhibitor versus gently reintroducing Aricept.

## 2014-09-25 NOTE — Assessment & Plan Note (Addendum)
There is a question as to whether the Aricept versus Megace was responsible for the nausea, vomiting, and diarrhea. My suspicion is that it was probably a viral event, and that we should be able to restart the 2 medications. Aricept should be started first considering rapid decline in mental status with discontinuation. He had also done well with Aricept for years. Certainly topical acetylcholinesterase inhibitors could be an option. I will defer this to his PCP.

## 2014-09-25 NOTE — Assessment & Plan Note (Signed)
Chronic nonhealing wound. Porcine graft is next.

## 2014-09-25 NOTE — Progress Notes (Signed)
  Subjective:    CC: Follow-up  HPI: Ischial fracture: Resolved, pain-free.  Vomiting and diarrhea: Unfortunately Angelus was doing very well and then developed nausea, vomiting, diarrhea, lost significant weight and was dehydrated. It was questioned as to what caused this, both Aricept and Megace were discontinued, unfortunately he has had a decline in his mental functioning with discontinuation of Aricept. Little by little, his GI symptoms improved. C. difficile studies were negative. He was up to 130 pounds, but unfortunately is back down to 125.  He does still feel a bit dehydrated and oral consumption is insufficient.  Olecranon bursitis, nonhealing wound: At this point wound care is going to proceed with porcine skin graft.  Past medical history, Surgical history, Family history not pertinant except as noted below, Social history, Allergies, and medications have been entered into the medical record, reviewed, and no changes needed.   Review of Systems: No fevers, chills, night sweats, weight loss, chest pain, or shortness of breath.   Objective:    General: Well Developed, well nourished, and in no acute distress.  Neuro: Alert and oriented x3, extra-ocular muscles intact, sensation grossly intact.  HEENT: Normocephalic, atraumatic, pupils equal round reactive to light, neck supple, no masses, no lymphadenopathy, thyroid nonpalpable.  Skin: Warm and dry, no rashes. Cardiac: Regular rate and rhythm, no murmurs rubs or gallops, no lower extremity edema.  Respiratory: Clear to auscultation bilaterally. Not using accessory muscles, speaking in full sentences.  A 20-gauge Angiocath was placed into the left cephalic vein at the level of the wrist by physician, and 2 L of normal saline were run without a problem.  Impression and Recommendations:    I spent 40 minutes with this patient, greater than 50% was face-to-face time counseling regarding the above diagnoses.

## 2014-09-28 ENCOUNTER — Ambulatory Visit (INDEPENDENT_AMBULATORY_CARE_PROVIDER_SITE_OTHER): Payer: BLUE CROSS/BLUE SHIELD | Admitting: Family Medicine

## 2014-09-28 ENCOUNTER — Encounter: Payer: Self-pay | Admitting: Family Medicine

## 2014-09-28 VITALS — BP 103/55 | HR 69 | Wt 126.0 lb

## 2014-09-28 DIAGNOSIS — R112 Nausea with vomiting, unspecified: Secondary | ICD-10-CM

## 2014-09-28 DIAGNOSIS — R197 Diarrhea, unspecified: Secondary | ICD-10-CM | POA: Diagnosis not present

## 2014-09-28 DIAGNOSIS — R531 Weakness: Secondary | ICD-10-CM

## 2014-09-28 DIAGNOSIS — F0150 Vascular dementia without behavioral disturbance: Secondary | ICD-10-CM

## 2014-09-28 MED ORDER — RIVASTIGMINE 4.6 MG/24HR TD PT24: 4.6000 mg | MEDICATED_PATCH | Freq: Every day | TRANSDERMAL | Status: DC

## 2014-09-28 NOTE — Progress Notes (Signed)
Subjective:    Patient ID: Stanley Meyers, male    DOB: Aug 31, 1924, 81 y.o.   MRN: [id number]  HPI  Patient has had recurrent episodes of diarrhea and vomiting over the last several weeks. In fact he was here last week with dehydration and given IV fluids. His Megace and Aricept were both discontinued as possible culprits for the vomiting and diarrhea. The he had been on both of these medications for quite some time without any difficulty. No other source was found for the nausea vomiting and diarrhea. He does seem to be a lot better today. He's been keeping food down just fine.   Unfortunately he has had a significant decline in mental status and stopping the Aricept.  He is feeling more back at baseline.  Her daughter and wife are here today for the visit.    Open wound of right elbow-he follows at the wound care center.  His wife is here today says she just feels very overwhelmed. She is called to check into some options tube give her some relief at home so she can take care of some financial issues etc. She is trying to get things organized. She notes his health is continuing to decline. The daughter who is here today is very supportive. She also notes that he has become much more weak over the last couple of weeks. He is also become much more fearful. She says even if she walks out of the room and he cannot see her he gets upset. This is been very overwhelming for her.  Review of Systems     Objective:   Physical Exam  Constitutional: He is oriented to person, place, and time. He appears well-developed and well-nourished.  HENT:  Head: Normocephalic and atraumatic.  Cardiovascular: Normal rate, regular rhythm and normal heart sounds.   Pulmonary/Chest: Effort normal and breath sounds normal.  Neurological: He is alert and oriented to person, place, and time.  Skin: Skin is warm and dry.  No skin tenting or signs of dehydration on exam today.  Psychiatric: He has a normal mood and  affect. His behavior is normal.          Assessment & Plan:  Nausea/vomiting/diarrhea-still unclear etiology. Fortunately he is feeling a lot better which is great. Encouraged him to stay hydrated. Patient was worried about how to tell if he is dehydrated. Reviewed signs and symptoms to look out for.  Dementia-he has had a significant drop in mental status within 2 days of stopping the Aricept. We discussed maybe switching to the Exelon patch for restarting the Aricept. The family would prefer to try the patch first. Perception sent to the pharmacy. If it's too expensive they will call me back and we can retry the Aricept 5 mg dose. Next  Open wound of right elbow-he follows at the wound care center.  We discussed some local resources that may be helpful for her. She is Re: Checked into an adult daycare in [place] but the wait list is about 3 months out. The daughter he was here is very supportive. And I encouraged them to consider looking into senior care services. Unfortunately these will not be covered under insurance and they will have to pay out of pocket but this may be a consideration they should look into. At this point I'm not sure that he's really a candidate for hospice.   He is getting some physical therapy for the generalized weakness.  Time spent 45 min, > 50% spent  counseling about nausea, vomiting, diarrhea, dementia, and open wound, generalized weakness

## 2014-10-11 ENCOUNTER — Other Ambulatory Visit: Payer: Self-pay | Admitting: Family Medicine

## 2014-10-17 ENCOUNTER — Other Ambulatory Visit: Payer: Self-pay | Admitting: Family Medicine

## 2014-10-20 ENCOUNTER — Encounter (HOSPITAL_BASED_OUTPATIENT_CLINIC_OR_DEPARTMENT_OTHER): Payer: Medicare Other | Attending: Internal Medicine

## 2014-10-20 DIAGNOSIS — B9562 Methicillin resistant Staphylococcus aureus infection as the cause of diseases classified elsewhere: Secondary | ICD-10-CM | POA: Insufficient documentation

## 2014-10-20 DIAGNOSIS — M71021 Abscess of bursa, right elbow: Secondary | ICD-10-CM | POA: Diagnosis not present

## 2014-10-20 DIAGNOSIS — T8131XD Disruption of external operation (surgical) wound, not elsewhere classified, subsequent encounter: Secondary | ICD-10-CM | POA: Insufficient documentation

## 2014-10-24 ENCOUNTER — Telehealth: Payer: Self-pay

## 2014-10-24 DIAGNOSIS — M51369 Other intervertebral disc degeneration, lumbar region without mention of lumbar back pain or lower extremity pain: Secondary | ICD-10-CM

## 2014-10-24 DIAGNOSIS — M5136 Other intervertebral disc degeneration, lumbar region: Secondary | ICD-10-CM

## 2014-10-24 NOTE — Telephone Encounter (Signed)
Patient wife called stated that patient dementia is getting worse and he originally had a rx for a motorized scooter since he is unable to operate taht now patient wife is requesting a Rx for a regular wheel chair. Taaliyah Delpriore,CMA

## 2014-10-25 MED ORDER — AMBULATORY NON FORMULARY MEDICATION: Status: DC

## 2014-10-25 NOTE — Telephone Encounter (Signed)
Rx in box. 

## 2014-10-25 NOTE — Telephone Encounter (Signed)
CALLED PATIENT ADVISED THAT RX IS READY FOR PICKUP. Stanley Meyers,CMA

## 2014-10-27 DIAGNOSIS — T8131XD Disruption of external operation (surgical) wound, not elsewhere classified, subsequent encounter: Secondary | ICD-10-CM | POA: Diagnosis not present

## 2014-10-27 DIAGNOSIS — B9562 Methicillin resistant Staphylococcus aureus infection as the cause of diseases classified elsewhere: Secondary | ICD-10-CM | POA: Diagnosis not present

## 2014-10-27 DIAGNOSIS — M71021 Abscess of bursa, right elbow: Secondary | ICD-10-CM | POA: Diagnosis not present

## 2014-10-30 ENCOUNTER — Encounter: Payer: Self-pay | Admitting: Family Medicine

## 2014-10-30 ENCOUNTER — Ambulatory Visit (INDEPENDENT_AMBULATORY_CARE_PROVIDER_SITE_OTHER): Payer: BLUE CROSS/BLUE SHIELD | Admitting: Family Medicine

## 2014-10-30 VITALS — BP 102/59 | HR 61 | Wt 129.0 lb

## 2014-10-30 DIAGNOSIS — Z23 Encounter for immunization: Secondary | ICD-10-CM

## 2014-10-30 DIAGNOSIS — S40021A Contusion of right upper arm, initial encounter: Secondary | ICD-10-CM

## 2014-10-30 DIAGNOSIS — R112 Nausea with vomiting, unspecified: Secondary | ICD-10-CM | POA: Diagnosis not present

## 2014-10-30 DIAGNOSIS — M7021 Olecranon bursitis, right elbow: Secondary | ICD-10-CM

## 2014-10-30 DIAGNOSIS — F0150 Vascular dementia without behavioral disturbance: Secondary | ICD-10-CM | POA: Diagnosis not present

## 2014-10-30 NOTE — Progress Notes (Addendum)
   Subjective:    Patient ID: Stanley Meyers, male    DOB: 09/25/1924, 81 y.o.   MRN: [id number]  HPI Dementia - wife feels like hs is declining mentally. We had changed him to the exelon patch.  Having a hard time at night.  Says having a hard time getting him to use the walker. Only has about one more visit with home health. He has a very unstable gait and has fallen several times that sheer. His wife would like to be able to get a wheelchair. When she takes him out he is unable to walk for very short distance and she typically has to hold his arm to keep his balance. He has vascular dementia and is also underweight and has a very frail frame  Had a skin graft on the right elbow and starting experiencing pain above the graft.  Says noticed redness above this over his fistula that he has had for 8 years.  Was having severe pain.    Fortunately his nausea and vomiting has resolved since he was last year almost a month ago. He has not had any more episodes. He has actually gained a couple of pounds on his own off the Megace.  Review of Systems     Objective:   Physical Exam  Constitutional: He is oriented to person, place, and time. He appears well-developed and well-nourished.  HENT:  Head: Normocephalic and atraumatic.  Cardiovascular: Normal rate, regular rhythm and normal heart sounds.   Pulmonary/Chest: Effort normal and breath sounds normal.  Neurological: He is alert and oriented to person, place, and time.  Skin: Skin is warm and dry.  The fistula is completley compressible. No hard or firm.  Some redness over the site on the right upper arm.   Psychiatric: He has a normal mood and affect. His behavior is normal.          Assessment & Plan:  Dementia - consider retrying the aricept. Or we can even consider going up on the dose on the Exelon patch. His wife says they will finish out the current box of patches and she will let me know what she wants to be. Check she still has  some Aricept at home.  Right elbow  Now with skin graft.  Following at the wound clinic. Hopefully this graft will take.  N/V - resolved since last OV.   Pain and bruising just above the right elbow on the right arm over his fistula. It easily compressible. And it does look more like a bruise on his arm. Encouraged him to keep an eye on it over the next couple weeks. If it's not compressible or becomes harder firm then please call immediately.  [facility] - Will call and see if they provide DME like a wheelchair.  He really needs to have a light weight manual wheelchair as he does not have the strength to safely propel a standard manual wheelchair. He is underweight and has failure to thrive and is overall deconditioned and is a significant fall risk.  Prevnar 13 given today.

## 2014-10-31 ENCOUNTER — Other Ambulatory Visit: Payer: Self-pay | Admitting: *Deleted

## 2014-10-31 DIAGNOSIS — M5136 Other intervertebral disc degeneration, lumbar region: Secondary | ICD-10-CM

## 2014-10-31 MED ORDER — AMBULATORY NON FORMULARY MEDICATION: Status: DC

## 2014-11-02 ENCOUNTER — Telehealth: Payer: Self-pay | Admitting: Family Medicine

## 2014-11-02 NOTE — Telephone Encounter (Signed)
I sent order for Wheelchair to [facility]  At Fax [phone] and phone is [phone] and also had Dr. Madilyn Fireman to fill out forms for [facility]

## 2014-11-03 DIAGNOSIS — B9562 Methicillin resistant Staphylococcus aureus infection as the cause of diseases classified elsewhere: Secondary | ICD-10-CM | POA: Diagnosis not present

## 2014-11-03 DIAGNOSIS — M71021 Abscess of bursa, right elbow: Secondary | ICD-10-CM | POA: Diagnosis not present

## 2014-11-03 DIAGNOSIS — T8131XD Disruption of external operation (surgical) wound, not elsewhere classified, subsequent encounter: Secondary | ICD-10-CM | POA: Diagnosis not present

## 2014-11-03 NOTE — Assessment & Plan Note (Signed)
Due to ischial fracture and subsequent immobilization, the patient has significantly been deconditioned and now has a mobility limitation that significantly impairs his ability to perform all mobility related activities of daily living. He has insufficient strength to use a cane or walker, and he has enough space in his house to provide adequate access between rooms for maneuvering. Use of a manual lightweight wheelchair will improve the ability of the patient to perform his mobility-related activities of daily living, and he is extremely willing to use this. He will be able to propel a lightweight wheelchair in the home. He cannot self propel a standard wheelchair.

## 2014-11-07 ENCOUNTER — Telehealth: Payer: Self-pay | Admitting: *Deleted

## 2014-11-07 DIAGNOSIS — S51001S Unspecified open wound of right elbow, sequela: Secondary | ICD-10-CM

## 2014-11-09 NOTE — Telephone Encounter (Signed)
Referral placed.Stanley Meyers Lynetta  

## 2014-11-17 DIAGNOSIS — I770 Arteriovenous fistula, acquired: Secondary | ICD-10-CM | POA: Insufficient documentation

## 2014-11-17 DIAGNOSIS — A4902 Methicillin resistant Staphylococcus aureus infection, unspecified site: Secondary | ICD-10-CM | POA: Insufficient documentation

## 2014-11-17 DIAGNOSIS — L89014 Pressure ulcer of right elbow, stage 4: Secondary | ICD-10-CM | POA: Insufficient documentation

## 2014-11-22 ENCOUNTER — Other Ambulatory Visit: Payer: Self-pay | Admitting: Family Medicine

## 2014-11-27 ENCOUNTER — Other Ambulatory Visit: Payer: Self-pay | Admitting: *Deleted

## 2014-11-27 MED ORDER — AMLODIPINE BESYLATE 5 MG PO TABS
5.0000 mg | ORAL_TABLET | Freq: Every day | ORAL | Status: DC
Start: 2014-11-27 — End: 2015-10-17

## 2014-11-29 ENCOUNTER — Other Ambulatory Visit: Payer: Self-pay | Admitting: Family Medicine

## 2014-12-17 ENCOUNTER — Other Ambulatory Visit: Payer: Self-pay | Admitting: Family Medicine

## 2015-01-18 ENCOUNTER — Encounter: Payer: Self-pay | Admitting: Family Medicine

## 2015-01-18 ENCOUNTER — Ambulatory Visit (INDEPENDENT_AMBULATORY_CARE_PROVIDER_SITE_OTHER): Payer: BLUE CROSS/BLUE SHIELD | Admitting: Family Medicine

## 2015-01-18 VITALS — BP 103/54 | HR 63 | Wt 133.0 lb

## 2015-01-18 DIAGNOSIS — M7021 Olecranon bursitis, right elbow: Secondary | ICD-10-CM | POA: Diagnosis not present

## 2015-01-18 DIAGNOSIS — R600 Localized edema: Secondary | ICD-10-CM

## 2015-01-18 DIAGNOSIS — F0150 Vascular dementia without behavioral disturbance: Secondary | ICD-10-CM

## 2015-01-18 NOTE — Patient Instructions (Signed)
Cut the amlodipine in half.  Call in a couple of weeks and let us know if it is better.

## 2015-01-18 NOTE — Progress Notes (Signed)
   Subjective:    Patient ID: Stanley Meyers, male    DOB: [birth year], 81 y.o.   MRN: [id number]  HPI Dementia - doing better on the Aricept. Switched back 8 weeks ago.  His wife poling feels like he section doing much better with this regimen.  Right elbow open wound - following with wound care at [facility]. She feels like they are doing a fantastic job and really helping to take care of him. He is not much better over all but still has an area that is not completely healing.  Bilateral foot edema - on 80mg  of lasix. Has noticed increase in swelling over the last several weeks. Watching salt intake.  No CP or SOB. He denies any changes  CRF- Saw Nephrology,  Dr. Lorrene Reid in April.  Had Cr with GI  2.5 last week.   Review of Systems     Objective:   Physical Exam  Constitutional: He is oriented to person, place, and time. He appears well-developed and well-nourished.  HENT:  Head: Normocephalic and atraumatic.  Cardiovascular: Normal rate, regular rhythm and normal heart sounds.   Pulmonary/Chest: Effort normal and breath sounds normal.  Neurological: He is alert and oriented to person, place, and time.  Skin: Skin is warm and dry.  Psychiatric: He has a normal mood and affect. His behavior is normal.          Assessment & Plan:  Dementia- stable-doing actually much better on Aricept. In fact he is also improved his mobility and is being a little bit more active than previous.  Right elbow wound - following with wound care center at [facility] and doing much better than previous.  Bilataral foot edema   -unclear trigger at this point. Certainly could be related to renal function versus venous stasis. No signs of heart failure on exam today. He denies any shortest of breath or chest discomfort. Also consider medication side effect. decrease amlodipine to half a tab.  His blood pressures actually little low today so this would certainly be reasonable and see if the swelling improves over the  next couple weeks. Also may just be secondary to excess vasodilatation because of the high heat. Has been hitting in the mid to upper 90s here locally. Also recommend that we check a thyroid level. He is actually due to have some repeat blood work next week and that he will get it done at the same time. Bilateral foot edema -

## 2015-01-19 ENCOUNTER — Encounter: Payer: Self-pay | Admitting: Family Medicine

## 2015-01-21 ENCOUNTER — Other Ambulatory Visit: Payer: Self-pay | Admitting: Family Medicine

## 2015-02-15 LAB — TSH: TSH: 2.43 u[IU]/mL (ref 0.41–5.90)

## 2015-02-19 ENCOUNTER — Other Ambulatory Visit: Payer: Self-pay | Admitting: Family Medicine

## 2015-02-20 ENCOUNTER — Telehealth: Payer: Self-pay | Admitting: Family Medicine

## 2015-02-20 NOTE — Telephone Encounter (Signed)
Call pt: thyroid level done at [facility] looks great. Data will be manualy entered.

## 2015-02-21 NOTE — Telephone Encounter (Signed)
Called and informed pt's wife informed.Stanley Meyers

## 2015-03-03 ENCOUNTER — Other Ambulatory Visit: Payer: Self-pay | Admitting: Family Medicine

## 2015-03-05 ENCOUNTER — Other Ambulatory Visit: Payer: Self-pay | Admitting: Family Medicine

## 2015-03-13 ENCOUNTER — Encounter: Payer: Self-pay | Admitting: Family Medicine

## 2015-03-16 ENCOUNTER — Other Ambulatory Visit: Payer: Self-pay | Admitting: Family Medicine

## 2015-03-16 NOTE — Telephone Encounter (Signed)
Please call the patient and see if they are still taking the Prilosec which is also on the medication list. They both are for stomach acid and reflux or just want to make sure that he's only taking 1. If he is not on the Prilosec then we can certainly refill the famotidine.

## 2015-03-16 NOTE — Telephone Encounter (Signed)
Refill request. The is a historical medication. Please advise. FAMOTIDINE 40 MG PO TABS

## 2015-03-29 NOTE — Addendum Note (Signed)
Addended by: Darla Lesches T on: 03/29/2015 02:53 PM   Modules accepted: Orders, Medications

## 2015-03-29 NOTE — Telephone Encounter (Signed)
Called and spoke with pts wife. She stated that he is rx'd famotodine via his GI physician and he is not taking pantoprazole (PROTONIX). I do not see prilosec on his med list. I removed the pantoprazole (PROTONIX).

## 2015-04-05 ENCOUNTER — Ambulatory Visit (INDEPENDENT_AMBULATORY_CARE_PROVIDER_SITE_OTHER): Payer: BLUE CROSS/BLUE SHIELD | Admitting: Family Medicine

## 2015-04-05 DIAGNOSIS — Z23 Encounter for immunization: Secondary | ICD-10-CM | POA: Diagnosis not present

## 2015-04-23 ENCOUNTER — Other Ambulatory Visit: Payer: Self-pay | Admitting: Family Medicine

## 2015-05-28 ENCOUNTER — Other Ambulatory Visit: Payer: Self-pay | Admitting: Family Medicine

## 2015-06-03 ENCOUNTER — Other Ambulatory Visit: Payer: Self-pay | Admitting: Family Medicine

## 2015-06-04 NOTE — Telephone Encounter (Signed)
Charts stated pt no longer taking this medication. Please advise

## 2015-06-14 ENCOUNTER — Encounter: Payer: Self-pay | Admitting: Family Medicine

## 2015-06-14 ENCOUNTER — Ambulatory Visit (INDEPENDENT_AMBULATORY_CARE_PROVIDER_SITE_OTHER): Payer: BLUE CROSS/BLUE SHIELD | Admitting: Family Medicine

## 2015-06-14 VITALS — BP 129/54 | HR 67 | Ht 67.0 in | Wt 130.0 lb

## 2015-06-14 DIAGNOSIS — F0150 Vascular dementia without behavioral disturbance: Secondary | ICD-10-CM

## 2015-06-14 MED ORDER — DONEPEZIL HCL 10 MG PO TABS: 10.0000 mg | ORAL_TABLET | Freq: Every day | ORAL | Status: DC

## 2015-06-14 MED ORDER — MEMANTINE HCL 28 X 5 MG & 21 X 10 MG PO TABS: ORAL_TABLET | ORAL | Status: DC

## 2015-06-14 NOTE — Progress Notes (Signed)
   Subjective:    Patient ID: Stanley Meyers, male    DOB: 02-16-1925, 81 y.o.   MRN: [id number]  HPI Here fo f/u on dementia.  His wife is here with hm today. She feels his demential is progressing. Says he occ forgets her.  Thought he saw a dog and lady.  Occ sees things on the floor.  Not aggressive.  Still help with dishes. He is on Aricept 5 mg. He also takes Remeron to help with appetite and Lomotil as needed for diarrhea that he has not filled this recently.  Has a lot of phlegm production. His wife wants to know for any humidifier might be helpful to keep the passages moisturized. Her chills or other recent cold symptoms.    They recently moved to [facility] which is an elderly living facility. His wife reports that this is actually been fantastic to be all on one level. He gets very nervous if she is out of his line of sight. He is getting some assistance 2 days a week to help him walk. This gives his wife time to go do shopping and get out of the house for a little bit. She feels like things are manageable at this time.  Review of Systems     Objective:   Physical Exam  Constitutional: He is oriented to person, place, and time. He appears well-developed and well-nourished.  HENT:  Head: Normocephalic and atraumatic.  Cardiovascular: Normal rate, regular rhythm and normal heart sounds.   Pulmonary/Chest: Effort normal and breath sounds normal.  Neurological: He is alert and oriented to person, place, and time.  Skin: Skin is warm and dry.  Psychiatric: He has a normal mood and affect. His behavior is normal.          Assessment & Plan:  Dementia, vascular - Mini-Mental status exam score of 14 today. He was unable to redraw the pentagons. He had difficulty reading a sentence and following the instructions. He also had difficulty with orientation and registration. I did also have him draw a clock. He read some of the numbers out by hand and some of them are's. They  really only went from the 12 position down to about the 9:00 position. He was unable to set the hands on the clock without assistance from his wife. This is a significant decline from previous score test of 25 out of 32 years ago. will inc Aricpet to 10mg .  she tolerates this well over the next couple of weeks and we will go ahead and start Namenda as well. I discussed with him and his wife that the main goal of treatment is to help keep his current abilities and living status and help prevent decline as much as possible. I would like to see him back in 2 months to follow-up and make sure that he is doing well. I also gave his wife Stanley Meyers a copy of the book called the "36 hour day". It does sound like he is having some hallucinations as well such as seeing people or animals. There is nothing scary or abstract and what he is seeing.  Excess sputum production-definitely keeping the passages moisturized and running a humidifier especially this time years could be helpful.  Time spent 30 minutes, 50% time spent counseling and evaluating for dementia.

## 2015-06-15 ENCOUNTER — Other Ambulatory Visit: Payer: Self-pay | Admitting: Family Medicine

## 2015-07-05 LAB — BASIC METABOLIC PANEL
BUN: 48 mg/dL — AB (ref 4–21)
CREATININE: 2.4 mg/dL — AB (ref 0.6–1.3)
Glucose: 113 mg/dL
POTASSIUM: 4.3 mmol/L (ref 3.4–5.3)
Sodium: 139 mmol/L (ref 137–147)

## 2015-07-05 LAB — CBC AND DIFFERENTIAL
HEMATOCRIT: 32 % — AB (ref 41–53)
Hemoglobin: 10.9 g/dL — AB (ref 13.5–17.5)
Platelets: 131 10*3/uL — AB (ref 150–399)

## 2015-07-19 ENCOUNTER — Encounter: Payer: Self-pay | Admitting: Sports Medicine

## 2015-07-19 ENCOUNTER — Ambulatory Visit (INDEPENDENT_AMBULATORY_CARE_PROVIDER_SITE_OTHER): Payer: Medicare Other | Admitting: Sports Medicine

## 2015-07-19 VITALS — BP 126/70 | HR 74 | Resp 16 | Wt 131.3 lb

## 2015-07-19 DIAGNOSIS — M170 Bilateral primary osteoarthritis of knee: Secondary | ICD-10-CM | POA: Diagnosis not present

## 2015-07-19 DIAGNOSIS — M19012 Primary osteoarthritis, left shoulder: Secondary | ICD-10-CM | POA: Insufficient documentation

## 2015-07-19 NOTE — Progress Notes (Signed)
Subjective:    CC: Shoulder pain and knee pain   HPI: Patient presents with years of left shoulder pain that has been slowly getting worse and worse. He has been unable to use the shoulder as he cannot lift it up. He is unsure if he is weak or if he feels like the joint is frozen. He denies radiation and says that there is no numbness or tingling. His wife says that this has prevented him from being able to do ADLs such as dressing himself. He denies any preceding event or trauma. His wife reports that the shoulder pops a lot.  Patient also says that his knee pain has returned. Just over 1 year ago the patient had Orthovisc injections bilaterally for knee OA. He says that the injections gave him relief for around a year but the pain has returned recently and has been getting worse and worse. He says that it has been "popping" a lot and he has had instability and a few falls recently. His wife says that he is unable to differentiate between weakness and pain but he has been complaining about his knees lately.  Past medical history, Surgical history, Family history not pertinant except as noted below, Social history, Allergies, and medications have been entered into the medical record, reviewed, and no changes needed.   Review of Systems: No fevers, chills, night sweats, weight loss, chest pain, or shortness of breath.   Objective:    General: Well Developed, well nourished, and in no acute distress.  Neuro: Alert and oriented x3 with mild memory impairment noted, extra-ocular muscles intact, sensation grossly intact.  HEENT: Normocephalic, atraumatic, pupils equal round reactive to light, neck supple, no masses, no lymphadenopathy, thyroid nonpalpable.  Skin: Warm and dry, no rashes. Cardiac: Regular rate and rhythm, no murmurs rubs or gallops, no lower extremity edema.  Respiratory: Clear to auscultation bilaterally. Not using accessory muscles, speaking in full sentences. Left  Shoulder: Inspection reveals effusion with no other abnormalities, atrophy or asymmetry. Active ROM limited to ~45 degrees and passive ROM limited to ~90 degrees with significant crepitus. Rotator cuff strength: 4/5 throughout. No signs of impingement with negative Neer and Hawkin's tests, empty can sign. Speeds and Yergason's tests normal. Labral pathology noted with positive Obrien's, positive clunk and good stability. Normal scapular function observed. No painful arc and no drop arm sign. No apprehension sign Right Knee: Mild effusion with no erythema or obvious bony abnormalities. Palpation notable for joint line tenderness and posterior patellar tenderness. No warmth or condyle tenderness. ROM full in flexion and extension and lower leg rotation. Ligaments with solid consistent endpoints including ACL, PCL, LCL, MCL. Negative Mcmurray's, Apley's, and Thessalonian tests. Non painful patellar compression. Patellar glide with crepitus. Patellar and quadriceps tendons unremarkable. Hamstring and quadriceps strength is normal.  Left Knee: Mild effusion with no erythema or obvious bony abnormalities. Palpation notable for joint line tenderness and posterior patellar tenderness. No warmth or condyle tenderness. ROM full in flexion and extension and lower leg rotation. Ligaments with solid consistent endpoints including ACL, PCL, LCL, MCL. Negative Mcmurray's, Apley's, and Thessalonian tests. Non painful patellar compression. Patellar glide with crepitus. Patellar and quadriceps tendons unremarkable. Hamstring and quadriceps strength is normal.   Procedure: Real-time Ultrasound Guided aspiration/Injection of left glenohumeral joint Device: GE Logiq E  Verbal informed consent obtained.  Time-out conducted.  Noted no overlying erythema, induration, or other signs of local infection.  Skin prepped in a sterile fashion.  Local anesthesia: Topical Ethyl chloride.  With sterile technique  and under real time ultrasound guidance:  Aspirated 85 mL of straw-colored fluid, syringe switched and 1 mL kenalog 40, 2 mL lidocaine, 2 mL Marcaine injected easily. Completed without difficulty  Pain immediately resolved suggesting accurate placement of the medication.  Advised to call if fevers/chills, erythema, induration, drainage, or persistent bleeding.  Images permanently stored and available for review in the ultrasound unit.  Impression: Technically successful ultrasound guided injection.  Procedure: Real-time Ultrasound Guided Injection of right knee Device: GE Logiq E  Verbal informed consent obtained.  Time-out conducted.  Noted no overlying erythema, induration, or other signs of local infection.  Skin prepped in a sterile fashion.  Local anesthesia: Topical Ethyl chloride.  With sterile technique and under real time ultrasound guidance:  2 mL lidocaine, 2 mL Marcaine, 1 mL kenalog 40 injected, syringe switched and 30 mg/2 mL of OrthoVisc (sodium hyaluronate) in a prefilled syringe was injected easily into the knee through a 22-gauge needle. Completed without difficulty  Pain immediately resolved suggesting accurate placement of the medication.  Advised to call if fevers/chills, erythema, induration, drainage, or persistent bleeding.  Images permanently stored and available for review in the ultrasound unit.  Impression: Technically successful ultrasound guided injection.  Procedure: Real-time Ultrasound Guided Injection of left knee Device: GE Logiq E  Verbal informed consent obtained.  Time-out conducted.  Noted no overlying erythema, induration, or other signs of local infection.  Skin prepped in a sterile fashion.  Local anesthesia: Topical Ethyl chloride.  With sterile technique and under real time ultrasound guidance:  2 mL lidocaine, 2 mL Marcaine, 1 mL kenalog 40 injected, syringe switched and 30 mg/2 mL of OrthoVisc (sodium hyaluronate) in a prefilled syringe was  injected easily into the knee through a 22-gauge needle. Completed without difficulty  Pain immediately resolved suggesting accurate placement of the medication.  Advised to call if fevers/chills, erythema, induration, drainage, or persistent bleeding.  Images permanently stored and available for review in the ultrasound unit.  Impression: Technically successful ultrasound guided injection.  Impression and Recommendations:    Shoulder: Patient's presentation concerning for adhesive capsulitis vs. OA of the glenohumeral joint. Will do steroid injection today and will RX home health/PT.  Knees: Consistent with patient's PMHx of OA. Will do steroid injections today for acute pain and begin the Orthovisc series bilaterally. Patient will also have home PT work with him.  UPDATE: Shoulder US showed significant effusion. This was drained and shoulder regained mobility. Presentation most likely OA, will continue with steroid injection.  I spent 40 minutes with this patient, greater than 50% was face-to-face time counseling regarding the above diagnoses, this time was separate from the time spent doing the procedures above.

## 2015-07-19 NOTE — Assessment & Plan Note (Signed)
Injection and aspiration of 85 mL. Home health physical therapy.

## 2015-07-19 NOTE — Assessment & Plan Note (Signed)
Steroid and Orthovisc injection into both knees. Return in one week for #2.

## 2015-07-24 ENCOUNTER — Other Ambulatory Visit: Payer: Self-pay | Admitting: Family Medicine

## 2015-07-26 ENCOUNTER — Encounter: Payer: Self-pay | Admitting: Sports Medicine

## 2015-07-26 ENCOUNTER — Ambulatory Visit (INDEPENDENT_AMBULATORY_CARE_PROVIDER_SITE_OTHER): Payer: Medicare Other | Admitting: Sports Medicine

## 2015-07-26 VITALS — BP 116/52 | HR 65 | Resp 16 | Wt 126.2 lb

## 2015-07-26 DIAGNOSIS — M19012 Primary osteoarthritis, left shoulder: Secondary | ICD-10-CM

## 2015-07-26 DIAGNOSIS — M170 Bilateral primary osteoarthritis of knee: Secondary | ICD-10-CM

## 2015-07-26 NOTE — Assessment & Plan Note (Signed)
Doing much better after aspiration of over 80 mL an injection, currently doing home health physical therapy.

## 2015-07-26 NOTE — Assessment & Plan Note (Signed)
Orthovisc injection #2 of 4 into both knees, return in one week for #3

## 2015-07-26 NOTE — Progress Notes (Signed)
  Subjective:    CC: Orthovisc injections and follow-up shoulder  HPI: Left glenohumeral arthritis: With likely full-thickness her tract rotator cuff tear, aspiration of over 80 mL of fluid as well as injection at the last visit, currently doing home health physical therapy and feeling significantly better.  Bilateral knee arthritis: Here for Orthovisc injection #2 into both knees.  Past medical history, Surgical history, Family history not pertinant except as noted below, Social history, Allergies, and medications have been entered into the medical record, reviewed, and no changes needed.   Review of Systems: No fevers, chills, night sweats, weight loss, chest pain, or shortness of breath.   Objective:    General: Well Developed, well nourished, and in no acute distress.  Neuro: Alert and oriented x3, extra-ocular muscles intact, sensation grossly intact.  HEENT: Normocephalic, atraumatic, pupils equal round reactive to light, neck supple, no masses, no lymphadenopathy, thyroid nonpalpable.  Skin: Warm and dry, no rashes. Cardiac: Regular rate and rhythm, no murmurs rubs or gallops, no lower extremity edema.  Respiratory: Clear to auscultation bilaterally. Not using accessory muscles, speaking in full sentences.  Procedure: Real-time Ultrasound Guided Injection of right knee Device: GE Logiq E  Verbal informed consent obtained.  Time-out conducted.  Noted no overlying erythema, induration, or other signs of local infection.  Skin prepped in a sterile fashion.  Local anesthesia: Topical Ethyl chloride.  With sterile technique and under real time ultrasound guidance:   30 mg/2 mL of OrthoVisc (sodium hyaluronate) in a prefilled syringe was injected easily into the knee through a 22-gauge needle. Completed without difficulty  Pain immediately resolved suggesting accurate placement of the medication.  Advised to call if fevers/chills, erythema, induration, drainage, or persistent  bleeding.  Images permanently stored and available for review in the ultrasound unit.  Impression: Technically successful ultrasound guided injection.  Procedure: Real-time Ultrasound Guided Injection of left knee Device: GE Logiq E  Verbal informed consent obtained.  Time-out conducted.  Noted no overlying erythema, induration, or other signs of local infection.  Skin prepped in a sterile fashion.  Local anesthesia: Topical Ethyl chloride.  With sterile technique and under real time ultrasound guidance:   30 mg/2 mL of OrthoVisc (sodium hyaluronate) in a prefilled syringe was injected easily into the knee through a 22-gauge needle. Completed without difficulty  Pain immediately resolved suggesting accurate placement of the medication.  Advised to call if fevers/chills, erythema, induration, drainage, or persistent bleeding.  Images permanently stored and available for review in the ultrasound unit.  Impression: Technically successful ultrasound guided injection.  Impression and Recommendations:

## 2015-08-02 ENCOUNTER — Encounter: Payer: Self-pay | Admitting: Sports Medicine

## 2015-08-02 ENCOUNTER — Ambulatory Visit (INDEPENDENT_AMBULATORY_CARE_PROVIDER_SITE_OTHER): Payer: Medicare Other | Admitting: Sports Medicine

## 2015-08-02 VITALS — BP 128/62 | HR 58 | Resp 18 | Wt 128.7 lb

## 2015-08-02 DIAGNOSIS — M170 Bilateral primary osteoarthritis of knee: Secondary | ICD-10-CM

## 2015-08-02 NOTE — Progress Notes (Signed)

## 2015-08-02 NOTE — Assessment & Plan Note (Signed)
Orthovisc injection #3 into both knees, return in one week for #4

## 2015-08-08 ENCOUNTER — Emergency Department
Admission: EM | Admit: 2015-08-08 | Discharge: 2015-08-08 | Disposition: A | Discharge status: Home / Self Care | Payer: Medicare Other | Source: Home / Self Care | Attending: Family Medicine | Admitting: Family Medicine

## 2015-08-08 ENCOUNTER — Encounter: Payer: Self-pay | Admitting: *Deleted

## 2015-08-08 DIAGNOSIS — H9202 Otalgia, left ear: Secondary | ICD-10-CM

## 2015-08-08 DIAGNOSIS — H6121 Impacted cerumen, right ear: Secondary | ICD-10-CM | POA: Diagnosis not present

## 2015-08-08 NOTE — ED Notes (Signed)
Pt c/o LT ear fullness and discomfort x 2 days. Denies fever. He instilled swimmers ear gtts without relief.

## 2015-08-08 NOTE — ED Provider Notes (Signed)
CSN: [id number]     Arrival date & time 08/08/15  1658 History   First MD Initiated Contact with Patient 08/08/15 1838     Chief Complaint  Patient presents with  . Ear Fullness      HPI Comments: Patient complains of a sensation of his left ear being clogged for several days, but no earache.  The history is provided by the patient and the spouse.    Past Medical History  Diagnosis Date  . Kidney failure   . GERD (gastroesophageal reflux disease)   . Hypertension   . Hyperlipemia   . Spasmatic colon   . Raynaud's disease   . Diverticulosis   . Dementia    Past Surgical History  Procedure Laterality Date  . Cholecystectomy    . Hernia repair    . Av fistula placement     History reviewed. No pertinent family history. Social History  Substance Use Topics  . Smoking status: Former Research scientist (life sciences)  . Smokeless tobacco: Never Used  . Alcohol Use: 1.2 oz/week    2 Glasses of wine per week    Review of Systems No sore throat + cough No pleuritic pain No wheezing + nasal congestion No post-nasal drainage No sinus pain/pressure No itchy/red eyes ? earache No hemoptysis No SOB No fever/chills No nausea No vomiting No abdominal pain No diarrhea No urinary symptoms No skin rash No fatigue No myalgias No headache Used OTC meds without relief  Allergies  Gabapentin; Linzess; and Tramadol  Home Medications   Prior to Admission medications   Medication Sig Start Date End Date Taking? Authorizing Provider  acetaminophen (TYLENOL) 325 MG tablet Take 2 tablets (650 mg total) by mouth every 6 (six) hours as needed. 05/07/14   Merryl Hacker, MD  AMBULATORY NON FORMULARY MEDICATION Manual wheelchair. 10/31/14   Silverio Decamp, MD  amLODipine (NORVASC) 5 MG tablet Take 1 tablet (5 mg total) by mouth daily. Patient taking differently: Take 2.5 mg by mouth daily.  11/27/14   Hali Marry, MD  diphenoxylate-atropine (LOMOTIL) 2.5-0.025 MG per tablet Take 1 tablet  by mouth 4 (four) times daily as needed for diarrhea or loose stools. 09/15/14   Sean Hommel, DO  donepezil (ARICEPT) 10 MG tablet Take 1 tablet (10 mg total) by mouth at bedtime. 06/14/15   Hali Marry, MD  famotidine (PEPCID) 40 MG tablet TAKE 1 TABLET BY MOUTH TWICE A DAY 03/29/15   Hali Marry, MD  furosemide (LASIX) 20 MG tablet TAKE 2 TABLETS BY MOUTH EVERY DAY 07/24/15   Hali Marry, MD  memantine [facility] TITRATION PACK) tablet pack 5 mg/day for =1 week; 5 mg twice daily for =1 week; 15 mg/day given in 5 mg and 10 mg separated doses for =1 week; then 10 mg twice daily 06/14/15   Hali Marry, MD  mirtazapine (REMERON) 15 MG tablet TAKE 1 TABLET BY MOUTH AT BEDTIME 06/15/15   Hali Marry, MD  PENNSAID 2 % SOLN APPLY TO AFFECTED AREA TWICE A DAY 09/12/14   Silverio Decamp, MD   Meds Ordered and Administered this Visit  Medications - No data to display  BP 126/70 mmHg  Pulse 77  Temp(Src) 98.2 F (36.8 C) (Oral)  Resp 16  Ht 5\' 7"  (1.702 m)  Wt 127 lb (57.607 kg)  BMI 19.89 kg/m2  SpO2 99% No data found.   Physical Exam Nursing notes and Vital Signs reviewed. Appearance:  Patient appears stated age, and in  no acute distress Eyes:  Pupils are equal, round, and reactive to light and accomodation.  Extraocular movement is intact.  Conjunctivae are not inflamed  Ears:  Right canal occluded with cerumen; post lavage, right canal and tympanic membrane normal.  Left canal and tympanic membrane normal. Nose:  Mildly congested turbinates.  No sinus tenderness.    Pharynx:  Normal Neck:  Supple.  No adenopathy    ED Course  Procedures  None    Labs Reviewed -  Tympanogram:  Left ear normal; Right ear positive peak pressure I   MDM   1. Otalgia, left   2. Cerumen impaction, right    No evidence otitis media. Recommend follow-up with Otolaryngology      Kandra Nicolas, MD 08/08/15 [id number]

## 2015-08-08 NOTE — Discharge Instructions (Signed)
Cerumen Impaction The structures of the external ear canal secrete a waxy substance known as cerumen. Excess cerumen can build up in the ear canal, causing a condition known as cerumen impaction. Cerumen impaction can cause ear pain and disrupt the function of the ear. The rate of cerumen production differs for each individual. In certain individuals, the configuration of the ear canal may decrease his or her ability to naturally remove cerumen. CAUSES Cerumen impaction is caused by excessive cerumen production or buildup. RISK FACTORS  Frequent use of swabs to clean ears.  Having narrow ear canals.  Having eczema.  Being dehydrated. SIGNS AND SYMPTOMS  Diminished hearing.  Ear drainage.  Ear pain.  Ear itch. TREATMENT Treatment may involve:  Over-the-counter or prescription ear drops to soften the cerumen.  Removal of cerumen by a health care provider. This may be done with:  Irrigation with warm water. This is the most common method of removal.  Ear curettes and other instruments.  Surgery. This may be done in severe cases. HOME CARE INSTRUCTIONS  Take medicines only as directed by your health care provider.  Do not insert objects into the ear with the intent of cleaning the ear. PREVENTION  Do not insert objects into the ear, even with the intent of cleaning the ear. Removing cerumen as a part of normal hygiene is not necessary, and the use of swabs in the ear canal is not recommended.  Drink enough water to keep your urine clear or pale yellow.  Control your eczema if you have it. SEEK MEDICAL CARE IF:  You develop ear pain.  You develop bleeding from the ear.  The cerumen does not clear after you use ear drops as directed.   This information is not intended to replace advice given to you by your health care provider. Make sure you discuss any questions you have with your health care provider.   Document Released: 07/10/2004 Document Revised: 06/23/2014  Document Reviewed: 01/17/2015 Elsevier Interactive Patient Education 2016 [employer]  

## 2015-08-09 ENCOUNTER — Encounter: Payer: Self-pay | Admitting: Sports Medicine

## 2015-08-09 ENCOUNTER — Ambulatory Visit (INDEPENDENT_AMBULATORY_CARE_PROVIDER_SITE_OTHER): Payer: Medicare Other | Admitting: Sports Medicine

## 2015-08-09 VITALS — BP 113/56 | HR 68 | Resp 16 | Wt 128.1 lb

## 2015-08-09 DIAGNOSIS — M170 Bilateral primary osteoarthritis of knee: Secondary | ICD-10-CM

## 2015-08-09 NOTE — Assessment & Plan Note (Signed)
Orthovisc injection #4 into both knees, return as needed. 

## 2015-08-09 NOTE — Progress Notes (Signed)

## 2015-08-11 ENCOUNTER — Telehealth: Payer: Self-pay | Admitting: Emergency Medicine

## 2015-08-14 ENCOUNTER — Encounter: Payer: Self-pay | Admitting: Family Medicine

## 2015-08-14 ENCOUNTER — Ambulatory Visit (INDEPENDENT_AMBULATORY_CARE_PROVIDER_SITE_OTHER): Payer: Medicare Other | Admitting: Family Medicine

## 2015-08-14 VITALS — BP 92/41 | HR 61 | Wt 129.0 lb

## 2015-08-14 DIAGNOSIS — G301 Alzheimer's disease with late onset: Secondary | ICD-10-CM | POA: Diagnosis not present

## 2015-08-14 DIAGNOSIS — R093 Abnormal sputum: Secondary | ICD-10-CM | POA: Diagnosis not present

## 2015-08-14 DIAGNOSIS — F0280 Dementia in other diseases classified elsewhere without behavioral disturbance: Secondary | ICD-10-CM | POA: Diagnosis not present

## 2015-08-14 DIAGNOSIS — R0989 Other specified symptoms and signs involving the circulatory and respiratory systems: Secondary | ICD-10-CM

## 2015-08-14 MED ORDER — MEMANTINE HCL 10 MG PO TABS: 10.0000 mg | ORAL_TABLET | Freq: Two times a day (BID) | ORAL | Status: DC

## 2015-08-14 MED ORDER — IPRATROPIUM BROMIDE 0.03 % NA SOLN: 1.0000 | Freq: Two times a day (BID) | NASAL | Status: DC

## 2015-08-14 NOTE — Progress Notes (Signed)
   Subjective:    Patient ID: Stanley Meyers, male    DOB: 1925-05-17, 82 y.o.   MRN: [id number]  HPI Dementia - Here to follow-up on new start for Namenda. He was previous on Aricept but when I saw him at the end of December we decided to add Namenda as he has had a decline in his dementia over the last year. His wife is with him today and says  At night gets a lot of mucous in his chest.  Tried mucinex and corcidin and it didn't really seem to help.  Takes his pepcid regularly.  It really bother him that during the daytime but at night it literally wakes him up and he starts choking and gagging. He otherwise sleeps well.   Review of Systems     Objective:   Physical Exam  Constitutional: He is oriented to person, place, and time. He appears well-developed and well-nourished.  HENT:  Head: Normocephalic and atraumatic.  Cardiovascular: Normal rate, regular rhythm and normal heart sounds.   Pulmonary/Chest: Effort normal and breath sounds normal.  Neurological: He is alert and oriented to person, place, and time.  Skin: Skin is warm and dry.  Psychiatric: He has a normal mood and affect. His behavior is normal.      Assessment & Plan:  Alzheimer's dementia-some progression over the last year. Doing well on the combination of Aricept and Namenda. New prescription sent to the pharmacy. Follow-up in 3-4 months.  Excess mucus in the chest-explained that this is either from reflux or from postnasal drip. He is already on an H2 blocker and wants to avoid taking a PPI. We discussed raising the head of the bed. In addition we can do a 3 week trial of Atrovent nasal spray to dry up secretions and see if that helps as well. New prescription sent to pharmacy.  Declines shingles vaccine.

## 2015-08-21 ENCOUNTER — Other Ambulatory Visit: Payer: Self-pay | Admitting: Family Medicine

## 2015-09-10 ENCOUNTER — Other Ambulatory Visit: Payer: Self-pay | Admitting: Family Medicine

## 2015-09-30 ENCOUNTER — Other Ambulatory Visit: Payer: Self-pay | Admitting: Family Medicine

## 2015-10-08 ENCOUNTER — Ambulatory Visit (INDEPENDENT_AMBULATORY_CARE_PROVIDER_SITE_OTHER): Payer: Medicare Other | Admitting: Sports Medicine

## 2015-10-08 ENCOUNTER — Encounter: Payer: Self-pay | Admitting: Sports Medicine

## 2015-10-08 VITALS — BP 112/66 | HR 61 | Resp 18 | Wt 124.9 lb

## 2015-10-08 DIAGNOSIS — M170 Bilateral primary osteoarthritis of knee: Secondary | ICD-10-CM | POA: Diagnosis not present

## 2015-10-08 DIAGNOSIS — M19012 Primary osteoarthritis, left shoulder: Secondary | ICD-10-CM

## 2015-10-08 NOTE — Progress Notes (Signed)
  Subjective:    CC: Follow-up  HPI: Left shoulder osteoarthritis: Doing well after aspiration and injection and with home health physical therapy. Essentially pain-free..  Bilateral knee osteoarthritis: Doing well after we finished Orthovisc 2 months ago, also getting home health physical therapy, and essentially pain-free.  Past medical history, Surgical history, Family history not pertinant except as noted below, Social history, Allergies, and medications have been entered into the medical record, reviewed, and no changes needed.   Review of Systems: No fevers, chills, night sweats, weight loss, chest pain, or shortness of breath.   Objective:    General: Well Developed, well nourished, and in no acute distress.  Neuro: Alert and oriented x3, extra-ocular muscles intact, sensation grossly intact.  HEENT: Normocephalic, atraumatic, pupils equal round reactive to light, neck supple, no masses, no lymphadenopathy, thyroid nonpalpable.  Skin: Warm and dry, no rashes. Cardiac: Regular rate and rhythm, no murmurs rubs or gallops, no lower extremity edema.  Respiratory: Clear to auscultation bilaterally. Not using accessory muscles, speaking in full sentences.  Impression and Recommendations:

## 2015-10-08 NOTE — Assessment & Plan Note (Signed)
Overall doing well with home health physical therapy, and is relatively pain-free after aspiration and injection. No further aggressive intervention.

## 2015-10-08 NOTE — Assessment & Plan Note (Signed)
Continues to do well after Orthovisc and with home health physical therapy, no changes in plan.

## 2015-10-10 ENCOUNTER — Other Ambulatory Visit: Payer: Self-pay | Admitting: Family Medicine

## 2015-10-17 ENCOUNTER — Other Ambulatory Visit: Payer: Self-pay | Admitting: *Deleted

## 2015-10-17 ENCOUNTER — Other Ambulatory Visit: Payer: Self-pay | Admitting: Family Medicine

## 2015-10-17 MED ORDER — AMLODIPINE BESYLATE 5 MG PO TABS: 5.0000 mg | ORAL_TABLET | Freq: Every day | ORAL | Status: DC

## 2015-11-10 ENCOUNTER — Other Ambulatory Visit: Payer: Self-pay | Admitting: Family Medicine

## 2015-11-13 ENCOUNTER — Ambulatory Visit: Payer: Medicare Other | Admitting: Family Medicine

## 2015-11-15 ENCOUNTER — Encounter: Payer: Self-pay | Admitting: Family Medicine

## 2015-11-15 ENCOUNTER — Ambulatory Visit (INDEPENDENT_AMBULATORY_CARE_PROVIDER_SITE_OTHER): Payer: Medicare Other | Admitting: Family Medicine

## 2015-11-15 VITALS — BP 117/65 | HR 61 | Wt 126.0 lb

## 2015-11-15 DIAGNOSIS — R7309 Other abnormal glucose: Secondary | ICD-10-CM

## 2015-11-15 DIAGNOSIS — F0150 Vascular dementia without behavioral disturbance: Secondary | ICD-10-CM | POA: Diagnosis not present

## 2015-11-15 DIAGNOSIS — I10 Essential (primary) hypertension: Secondary | ICD-10-CM | POA: Diagnosis not present

## 2015-11-15 DIAGNOSIS — H9193 Unspecified hearing loss, bilateral: Secondary | ICD-10-CM

## 2015-11-15 DIAGNOSIS — N184 Chronic kidney disease, stage 4 (severe): Secondary | ICD-10-CM | POA: Diagnosis not present

## 2015-11-15 LAB — POCT GLYCOSYLATED HEMOGLOBIN (HGB A1C): Hemoglobin A1C: 5.4

## 2015-11-15 NOTE — Progress Notes (Signed)
Subjective:    CC: HTN  HPI:  Hypertension- Pt denies chest pain, SOB, dizziness, or heart palpitations.  Taking meds as directed w/o problems.  Denies medication side effects.    Follow-up Alzheimer's disease. He is currently been on Namenda and Aricept. His wife is with him today and she says she's actually noticed some improvement. She's really thankful for the Namenda and says the edition has really helped. She is happy with the results and denies any side effects. Next  She he did want to let me know he got hearing aids about 2 months ago and has been very happy with that.  CKD 4-he does have his follow-up with Dr. Lorrene Reid in July. His last kidney infection was in January so she will likely do blood work at that time. If not then we will need to get up-to-date. Explained that we need to be following his kidney function at least every 6 months  IFG - no inc thrist or urination.  Past medical history, Surgical history, Family history not pertinant except as noted below, Social history, Allergies, and medications have been entered into the medical record, reviewed, and corrections made.   Review of Systems: No fevers, chills, night sweats, weight loss, chest pain, or shortness of breath.   Objective:    General: Well Developed, well nourished, and in no acute distress.  Neuro: Alert and oriented x3, extra-ocular muscles intact, sensation grossly intact.  HEENT: Normocephalic, atraumatic  Skin: Warm and dry, no rashes. Cardiac: Regular rate and rhythm, no murmurs rubs or gallops, no lower extremity edema.  Respiratory: Clear to auscultation bilaterally. Not using accessory muscles, speaking in full sentences.   Impression and Recommendations:   HTN- Well controlled. Continue current regimen. Follow up in 6 mo  Alzheimer's dementia- stable. Doing really well with comminution Aricept and Namenda. Continue current regimen. Follow-up in 3-4 months.  CKD 4-keep follow-up appointment  with nephrology in July. We'll likely do labs at that time. Next  Abnormal glucose-A1c looks great and is in the normal range. Lab Results  Component Value Date   HGBA1C 5.4 11/15/2015   Hearing loss-doing great with his new hearing aids.

## 2015-12-01 ENCOUNTER — Other Ambulatory Visit: Payer: Self-pay | Admitting: Family Medicine

## 2015-12-11 ENCOUNTER — Other Ambulatory Visit: Payer: Self-pay | Admitting: Family Medicine

## 2015-12-14 ENCOUNTER — Other Ambulatory Visit: Payer: Self-pay

## 2015-12-14 MED ORDER — DONEPEZIL HCL 10 MG PO TABS: 10.0000 mg | ORAL_TABLET | Freq: Every day | ORAL | Status: DC

## 2015-12-14 MED ORDER — MIRTAZAPINE 15 MG PO TABS: 15.0000 mg | ORAL_TABLET | Freq: Every day | ORAL | Status: DC

## 2015-12-14 NOTE — Telephone Encounter (Signed)
Patient request refill for Mirtazapine 15 mg and Donepezil 10 mg. It was approved and sent to pharmacy. Rhonda Cunningham,CMA

## 2015-12-20 ENCOUNTER — Encounter: Payer: Self-pay | Admitting: Sports Medicine

## 2015-12-20 ENCOUNTER — Ambulatory Visit (INDEPENDENT_AMBULATORY_CARE_PROVIDER_SITE_OTHER): Payer: Medicare Other | Admitting: Sports Medicine

## 2015-12-20 VITALS — BP 129/71 | HR 67 | Ht 67.0 in | Wt 125.0 lb

## 2015-12-20 DIAGNOSIS — B354 Tinea corporis: Secondary | ICD-10-CM | POA: Insufficient documentation

## 2015-12-20 MED ORDER — TERBINAFINE HCL 250 MG PO TABS: 250.0000 mg | ORAL_TABLET | Freq: Every day | ORAL | Status: AC

## 2015-12-20 MED ORDER — CLOTRIMAZOLE-BETAMETHASONE 1-0.05 % EX CREA: 1.0000 "application " | TOPICAL_CREAM | Freq: Two times a day (BID) | CUTANEOUS | Status: DC

## 2015-12-20 NOTE — Assessment & Plan Note (Signed)
With a scaly leading edge on the anterior left shoulder. Let us so for 2 weeks, also topical Lotrisone. Return to see me in 2 weeks to reevaluate.

## 2015-12-20 NOTE — Patient Instructions (Signed)

## 2015-12-20 NOTE — Progress Notes (Signed)
  Subjective:    CC:  Skin rash  HPI: For the past several weeks this pleasant 82 year old male has had a rash over his left anterior shoulder, there has also been some swelling in his left shoulder with pain, history of glenohumeral osteoarthritis. Symptoms are moderate, persistent. Rash itches, doesn't really hurt.  Past medical history, Surgical history, Family history not pertinant except as noted below, Social history, Allergies, and medications have been entered into the medical record, reviewed, and no changes needed.   Review of Systems: No fevers, chills, night sweats, weight loss, chest pain, or shortness of breath.   Objective:    General: Well Developed, well nourished, and in no acute distress.  Neuro: Alert and oriented x3, extra-ocular muscles intact, sensation grossly intact.  HEENT: Normocephalic, atraumatic, pupils equal round reactive to light, neck supple, no masses, no lymphadenopathy, thyroid nonpalpable.  Skin: Warm and dry, Large, minimally erythematous rash with a scaly leading edge over the anterior shoulder, approximately 15 cm across. No tenderness to palpation, no induration. There is also somewhat of a shoulder effusion. Cardiac: Regular rate and rhythm, no murmurs rubs or gallops, no lower extremity edema.  Respiratory: Clear to auscultation bilaterally. Not using accessory muscles, speaking in full sentences.  Impression and Recommendations:    I spent 25 minutes with this patient, greater than 50% was face-to-face time counseling regarding the above diagnoses

## 2015-12-21 ENCOUNTER — Ambulatory Visit: Payer: Medicare Other | Admitting: Sports Medicine

## 2016-01-03 ENCOUNTER — Encounter: Payer: Self-pay | Admitting: Sports Medicine

## 2016-01-03 ENCOUNTER — Ambulatory Visit (INDEPENDENT_AMBULATORY_CARE_PROVIDER_SITE_OTHER): Payer: Medicare Other | Admitting: Sports Medicine

## 2016-01-03 VITALS — BP 121/53 | HR 66 | Resp 16 | Wt 124.1 lb

## 2016-01-03 DIAGNOSIS — B354 Tinea corporis: Secondary | ICD-10-CM

## 2016-01-03 NOTE — Assessment & Plan Note (Signed)
Resolved with Lotrisone, because there is mild persistence of the rash, maybe 1% of what it was before she is going to continue the Lotrisone application for an additional 2 weeks. They have a trip coming up to the beach at [facility], have asked him to try best to stay out of the direct sunlight.

## 2016-01-03 NOTE — Progress Notes (Signed)
  Subjective:    CC: Follow-up  HPI: Left shoulder rash: Suspected tinea corporis at the last visit, wife has been applying topical Lotrisone to his left shoulder religiously, rash is approximately 99% resolved.  Past medical history, Surgical history, Family history not pertinant except as noted below, Social history, Allergies, and medications have been entered into the medical record, reviewed, and no changes needed.   Review of Systems: No fevers, chills, night sweats, weight loss, chest pain, or shortness of breath.   Objective:    General: Well Developed, well nourished, and in no acute distress.  Neuro: Alert and oriented x3, extra-ocular muscles intact, sensation grossly intact.  HEENT: Normocephalic, atraumatic, pupils equal round reactive to light, neck supple, no masses, no lymphadenopathy, thyroid nonpalpable.  Skin: Warm and dry, only minimal evidence of the prior rash. Cardiac: Regular rate and rhythm, no murmurs rubs or gallops, no lower extremity edema.  Respiratory: Clear to auscultation bilaterally. Not using accessory muscles, speaking in full sentences.  Impression and Recommendations:

## 2016-01-07 ENCOUNTER — Other Ambulatory Visit: Payer: Self-pay | Admitting: Family Medicine

## 2016-01-29 ENCOUNTER — Other Ambulatory Visit: Payer: Self-pay | Admitting: Family Medicine

## 2016-02-18 ENCOUNTER — Other Ambulatory Visit: Payer: Self-pay | Admitting: Family Medicine

## 2016-02-28 ENCOUNTER — Ambulatory Visit (INDEPENDENT_AMBULATORY_CARE_PROVIDER_SITE_OTHER): Payer: Medicare Other | Admitting: Family Medicine

## 2016-02-28 DIAGNOSIS — Z23 Encounter for immunization: Secondary | ICD-10-CM | POA: Diagnosis not present

## 2016-03-11 ENCOUNTER — Encounter: Payer: Self-pay | Admitting: Family Medicine

## 2016-03-11 ENCOUNTER — Ambulatory Visit (INDEPENDENT_AMBULATORY_CARE_PROVIDER_SITE_OTHER): Payer: Medicare Other | Admitting: Family Medicine

## 2016-03-11 VITALS — BP 122/54 | HR 64 | Temp 98.1°F | Wt 121.0 lb

## 2016-03-11 DIAGNOSIS — R112 Nausea with vomiting, unspecified: Secondary | ICD-10-CM | POA: Diagnosis not present

## 2016-03-11 DIAGNOSIS — R300 Dysuria: Secondary | ICD-10-CM

## 2016-03-11 DIAGNOSIS — R197 Diarrhea, unspecified: Secondary | ICD-10-CM

## 2016-03-11 NOTE — Patient Instructions (Signed)
Could consider changing the famotidine to liquid ranitidine ( Zantac).

## 2016-03-11 NOTE — Progress Notes (Signed)
Subjective:    CC: Nausea and vomiting  HPI:  82 year old male with chronic renal disease comes in today for vomiting and diarrhea. It started initially 5 days ago with persistent diarrhea. His wife gave him a dose of Lomotil but then he started to have bleeding and abdominal pain. The next day he started vomiting so she gave him Zofran. She also then later tried to give him Imodium. He complained of decreased appetite. Then on Sunday morning around 5:30 AM she ended up taking him to the emergency room. He appeared to be look dehydrated. He was given IV fluids. They did do an x-ray of the abdomen which was negative. They also tend to do a urinary catheterization but unfortunately it was just traumatic. He has had persistent blood in the urine since then until today which seems to have improved but he is having some burning with urination. Today he feels weak and a little sleepy but is feeling a little better. He has been able to keep down a small amount of food and fluids today without vomiting. He also had a little bit of yogurt before he came here today as well as a half of a mashed banana this morning.  Past medical history, Surgical history, Family history not pertinant except as noted below, Social history, Allergies, and medications have been entered into the medical record, reviewed, and corrections made.   Review of Systems: No fevers, chills, night sweats, weight loss, chest pain, or shortness of breath.   Objective:    General: Well Developed, well nourished, and in no acute distress.  Neuro: Alert and oriented x3, extra-ocular muscles intact, sensation grossly intact.  HEENT: Normocephalic, atraumatic  Skin: Warm and dry, no rashes. Cardiac: Regular rate and rhythm, no murmurs rubs or gallops, no lower extremity edema.  Respiratory: Clear to auscultation bilaterally. Not using accessory muscles, speaking in full sentences.   Impression and Recommendations:    Diarrhea  - Just see  how he does over the next couple of days. If it continues to worsen or starts to vomit then may need IV fluids again. Just continue to push small amounts of fluid as he is feeling a little bit better. I would like to do food allergy testing. Check BMP.  Vomiting -  can use the Zofran as needed. Encouraged hydration as tolerated. He has seemed to improve over the last 24 hours.    Dysuria-likely from traumatic catheterization but if he starts to notice blood in the urine again or symptoms are getting worse and he can do a urinalysis at home and bring in the sample for a UA and it culture.

## 2016-03-12 LAB — BASIC METABOLIC PANEL WITH GFR
BUN: 37 mg/dL — AB (ref 7–25)
CALCIUM: 9 mg/dL (ref 8.6–10.3)
CO2: 21 mmol/L (ref 20–31)
Chloride: 106 mmol/L (ref 98–110)
Creat: 1.9 mg/dL — ABNORMAL HIGH (ref 0.70–1.11)
GFR, EST AFRICAN AMERICAN: 35 mL/min — AB (ref >=60)
GFR, Est Non African American: 31 mL/min — ABNORMAL LOW (ref >=60)
GLUCOSE: 105 mg/dL — AB (ref 65–99)
Potassium: 4.1 mmol/L (ref 3.5–5.3)
Sodium: 140 mmol/L (ref 135–146)

## 2016-03-14 LAB — IGG FOOD PANEL
ALLERGEN EGG WHITE IGG: 10.2 ug/mL — AB (ref <2.0)
ALLERGEN, MILK, IGG: 7.81 ug/mL — AB (ref <0.15)
Beef, IgG: 14.2 ug/mL — ABNORMAL HIGH (ref <2.0)
Egg yolk, IgG: 7.4 ug/mL — ABNORMAL HIGH (ref <2.0)
Wheat, IgG: 0.31 ug/mL — ABNORMAL HIGH (ref <0.15)

## 2016-03-17 ENCOUNTER — Ambulatory Visit: Payer: Medicare Other | Admitting: Family Medicine

## 2016-04-08 ENCOUNTER — Ambulatory Visit (INDEPENDENT_AMBULATORY_CARE_PROVIDER_SITE_OTHER): Payer: Medicare Other | Admitting: Sports Medicine

## 2016-04-08 ENCOUNTER — Encounter: Payer: Self-pay | Admitting: Sports Medicine

## 2016-04-08 DIAGNOSIS — M19012 Primary osteoarthritis, left shoulder: Secondary | ICD-10-CM | POA: Diagnosis not present

## 2016-04-08 DIAGNOSIS — M170 Bilateral primary osteoarthritis of knee: Secondary | ICD-10-CM

## 2016-04-08 NOTE — Assessment & Plan Note (Signed)
Continues to do well after Orthovisc 8 months ago.

## 2016-04-08 NOTE — Progress Notes (Signed)
  Subjective:    CC: Follow-up  HPI: Primary osteoarthritis of both knees: Doing well after viscous supplementation 8 months ago.  Left shoulder pain: With glenohumeral osteoarthritis and rotator cuff insufficiency, well after aspiration and injection of the subacromial bursa about 7 months ago. Having a recurrence of pain and swelling.  Past medical history:  Negative.  See flowsheet/record as well for more information.  Surgical history: Negative.  See flowsheet/record as well for more information.  Family history: Negative.  See flowsheet/record as well for more information.  Social history: Negative.  See flowsheet/record as well for more information.  Allergies, and medications have been entered into the medical record, reviewed, and no changes needed.   Review of Systems: No fevers, chills, night sweats, weight loss, chest pain, or shortness of breath.   Objective:    General: Well Developed, well nourished, and in no acute distress.  Neuro: Alert and oriented x3, extra-ocular muscles intact, sensation grossly intact.  HEENT: Normocephalic, atraumatic, pupils equal round reactive to light, neck supple, no masses, no lymphadenopathy, thyroid nonpalpable.  Skin: Warm and dry, no rashes. Cardiac: Regular rate and rhythm, no murmurs rubs or gallops, no lower extremity edema.  Respiratory: Clear to auscultation bilaterally. Not using accessory muscles, speaking in full sentences. Left shoulder: Palpable crepitus, fluctuance in the subacromial bursa.  Procedure: Real-time Ultrasound Guided aspiration/Injection of left subacromial, subdeltoid bursa Device: GE Logiq E  Verbal informed consent obtained.  Time-out conducted.  Noted no overlying erythema, induration, or other signs of local infection.  Skin prepped in a sterile fashion.  Local anesthesia: Topical Ethyl chloride.  With sterile technique and under real time ultrasound guidance:  Using an 18-gauge needle I aspirated 20 mL  straw-colored fluid, syringe switched and 1 mL kenalog 40, 1 mL lidocaine injected easily. Completed without difficulty  Pain immediately resolved suggesting accurate placement of the medication.  Advised to call if fevers/chills, erythema, induration, drainage, or persistent bleeding.  Images permanently stored and available for review in the ultrasound unit.  Impression: Technically successful ultrasound guided injection.  Impression and Recommendations:    Osteoarthritis of both knees Continues to do well after Orthovisc 8 months ago.  Primary osteoarthritis of left shoulder Did well after aspiration and injection 7 months ago, repeating today.

## 2016-04-08 NOTE — Assessment & Plan Note (Signed)
Did well after aspiration and injection 7 months ago, repeating today.

## 2016-05-27 ENCOUNTER — Other Ambulatory Visit: Payer: Self-pay | Admitting: Family Medicine

## 2016-06-10 ENCOUNTER — Other Ambulatory Visit: Payer: Self-pay | Admitting: Family Medicine

## 2016-06-12 ENCOUNTER — Encounter: Payer: Self-pay | Admitting: Family Medicine

## 2016-06-12 ENCOUNTER — Ambulatory Visit (INDEPENDENT_AMBULATORY_CARE_PROVIDER_SITE_OTHER): Payer: Medicare Other | Admitting: Family Medicine

## 2016-06-12 VITALS — BP 100/56 | HR 60 | Ht 67.0 in | Wt 123.0 lb

## 2016-06-12 DIAGNOSIS — I7300 Raynaud's syndrome without gangrene: Secondary | ICD-10-CM | POA: Diagnosis not present

## 2016-06-12 DIAGNOSIS — F0150 Vascular dementia without behavioral disturbance: Secondary | ICD-10-CM | POA: Diagnosis not present

## 2016-06-12 DIAGNOSIS — N184 Chronic kidney disease, stage 4 (severe): Secondary | ICD-10-CM | POA: Diagnosis not present

## 2016-06-12 DIAGNOSIS — R627 Adult failure to thrive: Secondary | ICD-10-CM

## 2016-06-12 MED ORDER — MIRTAZAPINE 7.5 MG PO TABS: 15.0000 mg | ORAL_TABLET | Freq: Every day | ORAL | 0 refills | Status: DC

## 2016-06-12 NOTE — Progress Notes (Signed)
Subjective:    CC: wants to wean the mirtazepine.    HPI: nephrologist took him off of amlodipine and told him to take lasix prn due to his dystolic being so low. also his wife would like to discuss the mirtazapine she would like to wean him off of this.  She feels like he actually has really picked up his appetite and is doing well. Also his creatinine recently has dropped down into the 2 range which is fantastic. She says overall since the summer he's been doing great until over the last month she's noticed a little bit more decline in his memory. He is currently on Aricept and Namenda and has been on that for a while.  Raynaud's-he says is related has been flaring a little bit more in his hands since the temperature has dropped this winter. He tries to wear gloves  Past medical history, Surgical history, Family history not pertinant except as noted below, Social history, Allergies, and medications have been entered into the medical record, reviewed, and corrections made.   Review of Systems: No fevers, chills, night sweats, weight loss, chest pain, or shortness of breath.   Objective:    General: Well Developed, well nourished, and in no acute distress.  Neuro: Alert and oriented x3, extra-ocular muscles intact, sensation grossly intact.  HEENT: Normocephalic, atraumatic  Skin: Warm and dry, no rashes. Cardiac: Regular rate and rhythm, no murmurs rubs or gallops, no lower extremity edema.  Respiratory: Clear to auscultation bilaterally. Not using accessory muscles, speaking in full sentences.   Impression and Recommendations:    Dementia-currently on Aricept and Namenda fairly stable until last month. We'll continue to monitor.  Raynaud's-did discuss with him and his wife that he may have more frequent symptoms since he is off the amlodipine now but if he can continue to try to keep his hands warm that should help get him through the winter.  Failure to thrive an adult-overall he is  doing better so I do think it's time to wean the mirtazapine and see if he does well without it. Written taper provided a new prescription sent to the pharmacy for 7.5 mg.  CKD 4-renal function stable. Following with nephrology.

## 2016-06-12 NOTE — Patient Instructions (Signed)
Decrease remeron to 7.5 mg QD x 7 days, then 1/2 tab daily x 7 days, and then stop.

## 2016-07-10 ENCOUNTER — Ambulatory Visit (INDEPENDENT_AMBULATORY_CARE_PROVIDER_SITE_OTHER): Payer: Medicare Other | Admitting: Family Medicine

## 2016-07-10 VITALS — BP 138/71 | HR 64 | Temp 97.1°F

## 2016-07-10 DIAGNOSIS — K529 Noninfective gastroenteritis and colitis, unspecified: Secondary | ICD-10-CM

## 2016-07-10 NOTE — Progress Notes (Signed)
   Subjective:    Patient ID: Stanley Meyers, male    DOB: 03-27-1925, 83 y.o.   MRN: [id number]  HPI 83 year old male comes in today with his wife. He periodically gets these episodes with vomiting and diarrhea. Typically he vomits a few times and then is followed by diarrhea for several days. It seems to be episodic. The last time it happened was in November. Typically he ends up needing IV fluids and sometimes will spend the night in the hospital. His wife really wanted to avoid hospitalization and several him in today to be evaluated for possible IV fluids. Again this started about 3 days ago with vomiting. That ended fairly quickly and then he has had persistent diarrhea since then. She said she really push fluids and Gatorade yesterday and he was able to tolerate it and keep it down. Thus far today she's given him about 16 ounces of Gatorade. He's also been eating the Pedialyte popsicles. He denies any significant abdominal pain or cramping. He is being followed by GI and they have addressed this issue multiple times.   Review of Systems Fevers chills or sweats. No chest pain. No blood in the stool. No excessive mucus in the stool. Just watery brown stools.    Objective:   Physical Exam  Constitutional: He is oriented to person, place, and time. He appears well-developed and well-nourished.  HENT:  Head: Normocephalic and atraumatic.  Right Ear: External ear normal.  Left Ear: External ear normal.  Nose: Nose normal.  Mouth/Throat: Oropharynx is clear and moist.  TMs and canals are clear.   Eyes: Conjunctivae and EOM are normal. Pupils are equal, round, and reactive to light.  Neck: Neck supple. No thyromegaly present.  Cardiovascular: Normal rate and normal heart sounds.   Pulmonary/Chest: Effort normal and breath sounds normal.  Abdominal: Soft. He exhibits no distension. There is no tenderness. There is no rebound.  Lymphadenopathy:    He has no cervical adenopathy.   Neurological: He is alert and oriented to person, place, and time.  Skin: Skin is warm and dry.  Psychiatric: He has a normal mood and affect.          Assessment & Plan:  Acute colitis/diarrhea-no red flag warnings today. Blood pressure looks fantastic. Pulse is normal for him he is not tachycardic. Membranes look moist. He is not having any tenderness or pain on abdominal exam. I think they have done a great job in trying to keep him hydrated and I think he is safe to go home. Reviewed with his wife the symptoms and signs of dehydration and she is always welcome to bring him back in tomorrow if she's not able to get him to drink more fluids today. I did encourage her to try to get at least another 16 ounces of Gatorade in him as well as let him have a couple more the Pedialyte popsicles. Try to eat a little soup broth or some rice tonight. Advance diet as tolerated.

## 2016-08-10 ENCOUNTER — Other Ambulatory Visit: Payer: Self-pay | Admitting: Family Medicine

## 2016-08-19 ENCOUNTER — Other Ambulatory Visit: Payer: Self-pay | Admitting: Surgery

## 2016-08-19 DIAGNOSIS — M79641 Pain in right hand: Secondary | ICD-10-CM

## 2016-08-19 DIAGNOSIS — M79642 Pain in left hand: Principal | ICD-10-CM

## 2016-09-17 ENCOUNTER — Ambulatory Visit (INDEPENDENT_AMBULATORY_CARE_PROVIDER_SITE_OTHER): Payer: Medicare Other | Admitting: Family Medicine

## 2016-09-17 ENCOUNTER — Encounter: Payer: Self-pay | Admitting: Family Medicine

## 2016-09-17 VITALS — BP 138/57 | HR 54 | Wt 121.6 lb

## 2016-09-17 DIAGNOSIS — G8929 Other chronic pain: Secondary | ICD-10-CM

## 2016-09-17 DIAGNOSIS — M79675 Pain in left toe(s): Secondary | ICD-10-CM

## 2016-09-17 DIAGNOSIS — R600 Localized edema: Secondary | ICD-10-CM | POA: Diagnosis not present

## 2016-09-17 DIAGNOSIS — S80812A Abrasion, left lower leg, initial encounter: Secondary | ICD-10-CM | POA: Diagnosis not present

## 2016-09-17 MED ORDER — MUPIROCIN 2 % EX OINT: TOPICAL_OINTMENT | Freq: Two times a day (BID) | CUTANEOUS | 0 refills | Status: DC

## 2016-09-17 NOTE — Addendum Note (Signed)
Addended by: Beatrice Lecher D on: 09/17/2016 02:36 PM   Modules accepted: Orders

## 2016-09-17 NOTE — Progress Notes (Addendum)
Subjective:    Patient ID: Stanley Meyers, male    DOB: February 03, 1925, 83 y.o.   MRN: [id number]  HPI Patient comes in today complaining of left fifth toe pain. He says his had pain on and off forSeveral weeks. His wife reports that it was actually turn bright red at times. Even today it looks a little bit red. She says he can sometimes go multiple days in a row without any pain. Denies any known injury or trauma.. He says in fact last night it was bad enough that it kept him awake.  He also complains about pain over his left lower shin. He actually scraped his shin against the dishwasher door and took the skin off. His wife has been applying Vaseline and gauze.  She's not seeing any sign of fever or infection or drainage. They have been trying to use antifungal powder in between the toes.   Review of Systems  BP (!) 138/57   Pulse (!) 54   Wt 121 lb 9.6 oz (55.2 kg)   SpO2 100%   BMI 19.05 kg/m     Allergies  Allergen Reactions  . Gabapentin Swelling  . Linzess [Linaclotide] Nausea And Vomiting    bloating  . Tramadol Other (See Comments)    dizzy    Past Medical History:  Diagnosis Date  . Dementia   . Diverticulosis   . GERD (gastroesophageal reflux disease)   . Hyperlipemia   . Hypertension   . Kidney failure   . Raynaud's disease   . Spasmatic colon     Past Surgical History:  Procedure Laterality Date  . AV FISTULA PLACEMENT    . CHOLECYSTECTOMY    . HERNIA REPAIR      Social History   Social History  . Marital status: Married    Spouse name: Vira Agar  . Number of children: N/A  . Years of education: N/A   Occupational History  . Retired     Social History Main Topics  . Smoking status: Former Research scientist (life sciences)  . Smokeless tobacco: Never Used  . Alcohol use 1.2 oz/week    2 Glasses of wine per week  . Drug use: No  . Sexual activity: Not on file   Other Topics Concern  . Not on file   Social History Narrative  . No narrative on file    No family  history on file.  Outpatient Encounter Prescriptions as of 09/17/2016  Medication Sig  . acetaminophen (TYLENOL) 325 MG tablet Take 2 tablets (650 mg total) by mouth every 6 (six) hours as needed.  . AMBULATORY NON FORMULARY MEDICATION Manual wheelchair.  . Calcium-Phosphorus-Vitamin D (CITRACAL +D3 PO) Take by mouth.  . donepezil (ARICEPT) 10 MG tablet TAKE 1 TABLET (10 MG TOTAL) BY MOUTH AT BEDTIME.  . famotidine (PEPCID) 40 MG tablet TAKE 1 TABLET BY MOUTH TWICE A DAY  . FERREX 150 150 MG capsule Take 1 capsule by mouth daily.  . memantine (NAMENDA) 10 MG tablet TAKE 1 TABLET (10 MG TOTAL) BY MOUTH 2 (TWO) TIMES DAILY.  Marland Kitchen PENNSAID 2 % SOLN APPLY TO AFFECTED AREA TWICE A DAY  . Probiotic Product (PROBIOTIC PO) Take by mouth.  . mupirocin ointment (BACTROBAN) 2 % Apply topically 2 (two) times daily.  . [DISCONTINUED] clotrimazole-betamethasone (LOTRISONE) cream Apply 1 application topically 2 (two) times daily.  . [DISCONTINUED] furosemide (LASIX) 20 MG tablet Take 20 mg by mouth daily as needed for edema.  . [DISCONTINUED] mirtazapine (REMERON) 7.5 MG tablet  Take 2 tablets (15 mg total) by mouth at bedtime.   No facility-administered encounter medications on file as of 09/17/2016.         Objective:   Physical Exam  Constitutional: He is oriented to person, place, and time. He appears well-developed and well-nourished.  HENT:  Head: Normocephalic and atraumatic.  Eyes: Conjunctivae and EOM are normal.  Cardiovascular: Normal rate.   Pulmonary/Chest: Effort normal.  Musculoskeletal:  Left foot with some trace swelling on the dorsum of the foot. I'm unable to palpate a dorsal pedal pulse or posterior tibial pulse. The fifth digit looks a little red compared to the other digits. Nontender over the fifth metatarsal or the toe itself. He does have a small white thick scab on the inside of the toe. No active drainage. No distinct cellulitis on exam. Normal mobility of the fifth digit.   Neurological: He is alert and oriented to person, place, and time.  Skin: Skin is warm and dry. No pallor.  On the left shin he has an approximately 1 x 3 cm and is triangular shaped abrasion and just above that a smaller baby 1 x 0.5 cm abrasion. There is just a little erythema right around the edge but nothing extending outward. No active drainage.  Psychiatric: He has a normal mood and affect. His behavior is normal.  Vitals reviewed.         Assessment & Plan:  Left fifth toe pain-unclear etiology. He does have a small dried white scab on the inside of the toe.  Have his wife to apply mupirocin ointment for the next 5 days. Continue with antifungal powder in between the toes. Also move forward with ABIs to evaluate for possible peripheral vascular disease.  Abrasion, left shin-applied Xeroform with nonstick gauze and tape and dressing on top. Change dressing daily. Today the wound actually looks like it's healing well and there is no active drainage.

## 2016-09-26 ENCOUNTER — Other Ambulatory Visit: Payer: Self-pay | Admitting: Family Medicine

## 2016-09-29 ENCOUNTER — Encounter: Payer: Medicare Other | Admitting: Surgery

## 2016-09-29 ENCOUNTER — Other Ambulatory Visit (HOSPITAL_COMMUNITY): Payer: Medicare Other

## 2016-10-02 ENCOUNTER — Other Ambulatory Visit: Payer: Self-pay | Admitting: Family Medicine

## 2016-10-15 ENCOUNTER — Other Ambulatory Visit: Payer: Self-pay | Admitting: Family Medicine

## 2016-10-15 DIAGNOSIS — G8929 Other chronic pain: Secondary | ICD-10-CM

## 2016-10-15 DIAGNOSIS — M79675 Pain in left toe(s): Principal | ICD-10-CM

## 2016-10-15 DIAGNOSIS — S81802A Unspecified open wound, left lower leg, initial encounter: Secondary | ICD-10-CM

## 2016-10-21 ENCOUNTER — Ambulatory Visit (HOSPITAL_COMMUNITY)
Admission: RE | Admit: 2016-10-21 | Discharge: 2016-10-21 | Disposition: A | Discharge status: Home / Self Care | Payer: Medicare Other | Source: Ambulatory Visit | Attending: Cardiology | Admitting: Cardiology

## 2016-10-21 DIAGNOSIS — X58XXXA Exposure to other specified factors, initial encounter: Secondary | ICD-10-CM | POA: Diagnosis not present

## 2016-10-21 DIAGNOSIS — S81802A Unspecified open wound, left lower leg, initial encounter: Secondary | ICD-10-CM | POA: Insufficient documentation

## 2016-10-21 DIAGNOSIS — G8929 Other chronic pain: Secondary | ICD-10-CM | POA: Diagnosis present

## 2016-10-21 DIAGNOSIS — M79675 Pain in left toe(s): Secondary | ICD-10-CM | POA: Insufficient documentation

## 2016-10-21 DIAGNOSIS — R0989 Other specified symptoms and signs involving the circulatory and respiratory systems: Secondary | ICD-10-CM | POA: Diagnosis not present

## 2016-10-31 ENCOUNTER — Telehealth: Payer: Self-pay | Admitting: Family Medicine

## 2016-10-31 NOTE — Telephone Encounter (Signed)
Patient's wife Vira Agar came into the office today and requested that we consider going ahead and adjusting his dementia medications that she's noticed some progression over last month or so. We certainly could try increasing his Aricept to 23 mg. He's currently on 10 mg in the next dosing strength is 23 mg. If she feels comfort with this and please let me know and we can send over a new prescription for him to try.  Beatrice Lecher, MD

## 2016-11-03 ENCOUNTER — Telehealth: Payer: Self-pay | Admitting: *Deleted

## 2016-11-03 MED ORDER — DONEPEZIL HCL 23 MG PO TABS: 23.0000 mg | ORAL_TABLET | Freq: Every day | ORAL | 1 refills | Status: DC

## 2016-11-03 NOTE — Telephone Encounter (Signed)
Pt's wife called concerned whether or not he should increase his dementia medication. Maryruth Eve, [place] Crocker   Dr. Madilyn Fireman had already placed a phone note regarding this.Audelia Hives [place]

## 2016-11-03 NOTE — Telephone Encounter (Signed)
Pt's wife informed and would like to move forward with new dosing.Maryruth Eve, [place] Crocker

## 2016-11-13 ENCOUNTER — Ambulatory Visit (INDEPENDENT_AMBULATORY_CARE_PROVIDER_SITE_OTHER): Payer: Medicare Other | Admitting: Sports Medicine

## 2016-11-13 ENCOUNTER — Encounter: Payer: Self-pay | Admitting: Sports Medicine

## 2016-11-13 DIAGNOSIS — M170 Bilateral primary osteoarthritis of knee: Secondary | ICD-10-CM

## 2016-11-13 NOTE — Assessment & Plan Note (Signed)
Left knee aspiration and injection, with steroid. Working on getting him improved again for Orthovisc, his last Orthovisc series was almost a year and a half ago. He will return to start Orthovisc when approved.

## 2016-11-13 NOTE — Progress Notes (Signed)
  Subjective:    CC: Left knee pain  HPI: This is a pleasant 83 year old male, a year and a half ago we did Orthovisc and he responded well. He has been working hard in physical therapy and on the stationary bike, and is now having a recurrence of pain and swelling in the left knee. Moderate, persistent, localized at the medial joint line without radiation.  Past medical history:  Negative.  See flowsheet/record as well for more information.  Surgical history: Negative.  See flowsheet/record as well for more information.  Family history: Negative.  See flowsheet/record as well for more information.  Social history: Negative.  See flowsheet/record as well for more information.  Allergies, and medications have been entered into the medical record, reviewed, and no changes needed.   Review of Systems: No fevers, chills, night sweats, weight loss, chest pain, or shortness of breath.   Objective:    General: Well Developed, well nourished, and in no acute distress.  Neuro: Alert and oriented x3, extra-ocular muscles intact, sensation grossly intact.  HEENT: Normocephalic, atraumatic, pupils equal round reactive to light, neck supple, no masses, no lymphadenopathy, thyroid nonpalpable.  Skin: Warm and dry, no rashes. Cardiac: Regular rate and rhythm, no murmurs rubs or gallops, no lower extremity edema.  Respiratory: Clear to auscultation bilaterally. Not using accessory muscles, speaking in full sentences. Left Knee: Swollen with a palpable fluid wave, effusion and tenderness at the medial joint line ROM normal in flexion and extension and lower leg rotation. Ligaments with solid consistent endpoints including ACL, PCL, LCL, MCL. Negative Mcmurray's and provocative meniscal tests. Non painful patellar compression. Patellar and quadriceps tendons unremarkable. Hamstring and quadriceps strength is normal.  Procedure: Real-time Ultrasound Guided Injection of left knee Device: GE Logiq E    Verbal informed consent obtained.  Time-out conducted.  Noted no overlying erythema, induration, or other signs of local infection.  Skin prepped in a sterile fashion.  Local anesthesia: Topical Ethyl chloride.  With sterile technique and under real time ultrasound guidance:  Aspirated approximately 10 mL straw-colored fluid, syringe switched and 1 mL Kenalog 40, 2 mL lidocaine, 2 mL bupivacaine injected easily. Completed without difficulty  Pain immediately resolved suggesting accurate placement of the medication.  Advised to call if fevers/chills, erythema, induration, drainage, or persistent bleeding.  Images permanently stored and available for review in the ultrasound unit.  Impression: Technically successful ultrasound guided injection.  Impression and Recommendations:    Osteoarthritis of both knees Left knee aspiration and injection, with steroid. Working on getting him improved again for Orthovisc, his last Orthovisc series was almost a year and a half ago. He will return to start Orthovisc when approved.

## 2016-11-14 ENCOUNTER — Telehealth: Payer: Self-pay | Admitting: Sports Medicine

## 2016-11-14 NOTE — Telephone Encounter (Signed)
Submitted for approval on Orthovisc. Awaiting confirmation.  

## 2016-11-14 NOTE — Telephone Encounter (Signed)
-----   Message from Silverio Decamp, MD sent at 11/13/2016 11:38 AM EDT ----- Orthovisc please left knee OA, worked well in the past. ___________________________________________ Gwen Her. Dianah Field, M.D., ABFM., CAQSM. Primary Care and [place] Instructor of [place] [facility]

## 2016-11-28 ENCOUNTER — Ambulatory Visit (INDEPENDENT_AMBULATORY_CARE_PROVIDER_SITE_OTHER): Payer: Medicare Other | Admitting: Family Medicine

## 2016-11-28 ENCOUNTER — Encounter: Payer: Self-pay | Admitting: Family Medicine

## 2016-11-28 VITALS — BP 140/62 | HR 66 | Wt 119.0 lb

## 2016-11-28 DIAGNOSIS — M25431 Effusion, right wrist: Secondary | ICD-10-CM

## 2016-11-28 DIAGNOSIS — F0150 Vascular dementia without behavioral disturbance: Secondary | ICD-10-CM | POA: Diagnosis not present

## 2016-11-28 DIAGNOSIS — N184 Chronic kidney disease, stage 4 (severe): Secondary | ICD-10-CM

## 2016-11-28 DIAGNOSIS — K140 Glossitis: Secondary | ICD-10-CM | POA: Diagnosis not present

## 2016-11-28 MED ORDER — MAGIC MOUTHWASH W/LIDOCAINE: ORAL | 0 refills | Status: DC

## 2016-11-28 NOTE — Progress Notes (Signed)
Subjective:    Patient ID: Stanley Meyers, male    DOB: August 05, 1924, 83 y.o.   MRN: [id number]  HPI 83 year old male comes in today to follow-up on his dementia. His wife and actually called about 4 weeks ago. We discussed that her previous office visit that his dementia was progressing and had discussed the possibility of adjusting his Aricept. She did opt to increase dosing we sent a new prescription about 4 weeks ago.He spoke with the pharmacist and decided to gradually increase the medications that he's been on the 23 mg dose for about 1 week at this point. So far tolerating well without any negative side effects.  He is also concerned because his tongue has been hurting for about 2-3 weeks. They called the dentist and the dentist recommended using coconut oil. It really hasn't been helping.  Renal failure-he recently saw his nephrologist, Dr. Jamal Maes. Things seem to be stable overall and his iron is actually finally coming up. He was unable to tolerate a regular iron supplements that he is now taking a children's multivitamin with iron but he did have an increase in his hemoglobin which is fantastic. They're hoping to be able to hold off on an iron infusion.   He is also more recently had some right wrist swelling. He has a fistula in his right upper arm. It is actually causing now some distal swelling in the forearm. It's not painful or bothersome.  Review of Systems     Objective:   Physical Exam  Constitutional: He is oriented to person, place, and time.  Thin, frail male  HENT:  Head: Normocephalic and atraumatic.  Right Ear: External ear normal.  Left Ear: External ear normal.  Nose: Nose normal.  Mouth/Throat: Oropharynx is clear and moist.  Eyes: Conjunctivae are normal. Pupils are equal, round, and reactive to light.  Pulmonary/Chest: Effort normal.  Abdominal: There is no tenderness.  Musculoskeletal:  He does have swelling of his right wrist and forearm. He is  able to move the wrist and fingers without significant difficulty. He does have some blue discoloration of fingers consistent with his Raynaud's. Palpable thrill over the lateral side of the forearm.  Neurological: He is alert and oriented to person, place, and time. He has normal reflexes.  Skin: Skin is warm and dry.  Psychiatric: He has a normal mood and affect. His behavior is normal. Judgment and thought content normal.         Assessment & Plan:  Dementia- Continue with Aricept 23 mg at current dosing. F/U in 2-3 mo  Ulcer, tongue-we'll treat with Magic mouthwash. If not significantly better after one week and please give [place] a call back. Also consider nutritional deficiency as a possibility. Or biopsy if not healing  to eval for oral cancer, because the lesion has a smooth, shiny appearance which is a little bit unusual for an ulceration.  He was a smoker in his 70s but has not smoked since he was 83 years old. No other tobacco use.  Stage IV renal disease-following with Dr. Lorrene Reid. Fortunately his iron has been coming up with a children's multivitamin supplement.  Wrist swelling, right -  just distal to his fistula. We discussed options. I recommend a consultation with the vascular surgeon who actually did this ischial fistula to see if there is any need for repair concerned because of the swelling. At this point in time it's not bothering him so his wife wants to see how it does  over the next month or 2. She really is looking for more conservative therapy. He has significant difficulty with wound healing and wants to avoid surgery if at all possible.

## 2016-12-03 NOTE — Telephone Encounter (Addendum)
Pt's wife returned clinic call regarding OV injections. Attempted to go over OV benefits review with her- she refused stating "he has these before and his deductible and out of pocket didn't matter." Attempted a second time to explain the benefits review, Pt's wife interjected with "I don't care what it says, it didn't affect his cost last time and it wont this time. Just schedule him." Advised I would need to complete a prior authorization, then I would be able to schedule the Pt.  [facility], received authorization.  Auth: [id number]. Valid: 12/03/16-02/01/17.  Pt's wife advised of approval. Pt scheduled.

## 2016-12-03 NOTE — Telephone Encounter (Signed)
Received the following information from OV benefits investigation:   Orthovisc is covered. Buy and [employer] is allowed. The deductible has been met. Once the out of pocket is met, insurance will cover 100% of the allowable amount. A copay applies for the office visit in the amount of $25.00. Prior Authorization is required. Call reference number is [id number]. Action Steps: Please call [phone] to initiate the PA for Orthovisc  Left VM for Pt regarding info. Callback information provided.

## 2016-12-04 ENCOUNTER — Ambulatory Visit (INDEPENDENT_AMBULATORY_CARE_PROVIDER_SITE_OTHER): Payer: Medicare Other | Admitting: Sports Medicine

## 2016-12-04 ENCOUNTER — Encounter: Payer: Self-pay | Admitting: Sports Medicine

## 2016-12-04 DIAGNOSIS — M170 Bilateral primary osteoarthritis of knee: Secondary | ICD-10-CM | POA: Diagnosis not present

## 2016-12-04 MED ORDER — DICLOFENAC SODIUM 2 % TD SOLN: TRANSDERMAL | 11 refills | Status: AC

## 2016-12-04 NOTE — Progress Notes (Signed)
   Procedure: Real-time Ultrasound Guided aspiration/injection of left knee Device: GE Logiq E  Verbal informed consent obtained.  Time-out conducted.  Noted no overlying erythema, induration, or other signs of local infection.  Skin prepped in a sterile fashion.  Local anesthesia: Topical Ethyl chloride.  With sterile technique and under real time ultrasound guidance:  Aspirated 20 mL serosanguineous fluid, syringe switched and 30 mg/2 mL of OrthoVisc (sodium hyaluronate) in a prefilled syringe was injected easily into the knee through a 18-gauge needle. Completed without difficulty  Pain immediately resolved suggesting accurate placement of the medication.  Advised to call if fevers/chills, erythema, induration, drainage, or persistent bleeding.  Images permanently stored and available for review in the ultrasound unit.  Impression: Technically successful ultrasound guided injection.

## 2016-12-04 NOTE — Assessment & Plan Note (Signed)
Orthovisc injection #1 of 4 into the left knee, return in one week for #2

## 2016-12-11 ENCOUNTER — Ambulatory Visit (INDEPENDENT_AMBULATORY_CARE_PROVIDER_SITE_OTHER): Payer: Medicare Other | Admitting: Sports Medicine

## 2016-12-11 DIAGNOSIS — M170 Bilateral primary osteoarthritis of knee: Secondary | ICD-10-CM

## 2016-12-11 NOTE — Progress Notes (Signed)

## 2016-12-11 NOTE — Assessment & Plan Note (Signed)
Orthovisc injection #2 of 4 into the left knee, return in one week for #3.

## 2016-12-18 ENCOUNTER — Ambulatory Visit (INDEPENDENT_AMBULATORY_CARE_PROVIDER_SITE_OTHER): Payer: Medicare Other | Admitting: Sports Medicine

## 2016-12-18 ENCOUNTER — Encounter: Payer: Self-pay | Admitting: Sports Medicine

## 2016-12-18 DIAGNOSIS — M170 Bilateral primary osteoarthritis of knee: Secondary | ICD-10-CM | POA: Diagnosis not present

## 2016-12-18 DIAGNOSIS — M5136 Other intervertebral disc degeneration, lumbar region: Secondary | ICD-10-CM | POA: Diagnosis not present

## 2016-12-18 DIAGNOSIS — M51369 Other intervertebral disc degeneration, lumbar region without mention of lumbar back pain or lower extremity pain: Secondary | ICD-10-CM

## 2016-12-18 NOTE — Progress Notes (Signed)
  Subjective:    CC: Follow-up  HPI: Left knee osteoarthritis: Here for Orthovisc No. 3, doing well.  Low back pain: Lumbar spondylosis, has been through multiple injections, medications, nothing has worked.  Past medical history:  Negative.  See flowsheet/record as well for more information.  Surgical history: Negative.  See flowsheet/record as well for more information.  Family history: Negative.  See flowsheet/record as well for more information.  Social history: Negative.  See flowsheet/record as well for more information.  Allergies, and medications have been entered into the medical record, reviewed, and no changes needed.   Review of Systems: No fevers, chills, night sweats, weight loss, chest pain, or shortness of breath.   Objective:    General: Well Developed, well nourished, and in no acute distress.  Neuro: Alert and oriented x3, extra-ocular muscles intact, sensation grossly intact.  HEENT: Normocephalic, atraumatic, pupils equal round reactive to light, neck supple, no masses, no lymphadenopathy, thyroid nonpalpable.  Skin: Warm and dry, no rashes. Cardiac: Regular rate and rhythm, no murmurs rubs or gallops, no lower extremity edema.  Respiratory: Clear to auscultation bilaterally. Not using accessory muscles, speaking in full sentences.  Procedure: Real-time Ultrasound Guided Injection of left knee Device: GE Logiq E  Verbal informed consent obtained.  Time-out conducted.  Noted no overlying erythema, induration, or other signs of local infection.  Skin prepped in a sterile fashion.  Local anesthesia: Topical Ethyl chloride.  With sterile technique and under real time ultrasound guidance:   30 mg/2 mL of OrthoVisc (sodium hyaluronate) in a prefilled syringe was injected easily into the knee through a 22-gauge needle. Completed without difficulty  Pain immediately resolved suggesting accurate placement of the medication.  Advised to call if fevers/chills, erythema,  induration, drainage, or persistent bleeding.  Images permanently stored and available for review in the ultrasound unit.  Impression: Technically successful ultrasound guided injection.  Impression and Recommendations:    Primary osteoarthritis of both knees, left worse than right Orthovisc No. 3 of 4 into the left knee, return in one week for injection #4 of 4.  Lumbar degenerative disc disease Has not responded to multiple injections, medications. I am going to add another course of home health physical therapy for his back.

## 2016-12-18 NOTE — Assessment & Plan Note (Signed)
Has not responded to multiple injections, medications. I am going to add another course of home health physical therapy for his back.

## 2016-12-18 NOTE — Assessment & Plan Note (Signed)
Orthovisc No. 3 of 4 into the left knee, return in one week for injection #4 of 4.

## 2016-12-19 ENCOUNTER — Other Ambulatory Visit: Payer: Self-pay | Admitting: *Deleted

## 2016-12-19 ENCOUNTER — Telehealth: Payer: Self-pay | Admitting: *Deleted

## 2016-12-19 MED ORDER — MAGIC MOUTHWASH W/LIDOCAINE: ORAL | 0 refills | Status: DC

## 2016-12-19 NOTE — Telephone Encounter (Signed)
Pt's wife called and stated that his ulcers appear to be healing. She stated that the color is gone from bright red to a tan-ish color. She has him to rinse his mouth after eats. She states that it is not as painful for him as they were before. She wanted to know if she could get another refill of the mouthwash or continue to use the antacid in conjunction with the magic mouth wash.    According to OV note:   If not significantly better after one week and please give [place] a call back. Also consider nutritional deficiency as a possibility. Or biopsy if not healing  to eval for oral cancer, because the lesion has a smooth, shiny appearance which is a little bit unusual for an ulceration.   I informed her that we would call back with recommendations.Elouise Munroe'

## 2016-12-19 NOTE — Telephone Encounter (Signed)
Okay for mouthwash for now. If not completely gone by middle of next week then again consider testing for nutritional deficiencies. Will fax prescription to [facility].

## 2016-12-20 ENCOUNTER — Other Ambulatory Visit: Payer: Self-pay | Admitting: Family Medicine

## 2016-12-22 NOTE — Telephone Encounter (Signed)
Called pts wife to see if she picked up rx. She had not but will. Advised her to let us know if the pt is still having symptoms by Thursday of this week.

## 2016-12-24 ENCOUNTER — Telehealth: Payer: Self-pay | Admitting: Sports Medicine

## 2016-12-24 NOTE — Telephone Encounter (Signed)
Home Health called requesting a verbal order for pt to get Physical Therapy 2x a week for 6 weeks. Dr. Darene Lamer approved verbal order for [place].

## 2016-12-25 ENCOUNTER — Telehealth: Payer: Self-pay | Admitting: *Deleted

## 2016-12-25 ENCOUNTER — Ambulatory Visit (INDEPENDENT_AMBULATORY_CARE_PROVIDER_SITE_OTHER): Payer: Medicare Other | Admitting: Sports Medicine

## 2016-12-25 DIAGNOSIS — M170 Bilateral primary osteoarthritis of knee: Secondary | ICD-10-CM | POA: Diagnosis not present

## 2016-12-25 DIAGNOSIS — K121 Other forms of stomatitis: Secondary | ICD-10-CM

## 2016-12-25 NOTE — Progress Notes (Signed)
   Procedure: Real-time Ultrasound Guided Injection of left knee Device: GE Logiq E  Verbal informed consent obtained.  Time-out conducted.  Noted no overlying erythema, induration, or other signs of local infection.  Skin prepped in a sterile fashion.  Local anesthesia: Topical Ethyl chloride.  With sterile technique and under real time ultrasound guidance:   30 mg/2 mL of OrthoVisc (sodium hyaluronate) in a prefilled syringe was injected easily into the knee through a 22-gauge needle. Completed without difficulty  Pain immediately resolved suggesting accurate placement of the medication.  Advised to call if fevers/chills, erythema, induration, drainage, or persistent bleeding.  Images permanently stored and available for review in the ultrasound unit.  Impression: Technically successful ultrasound guided injection.  Impression and Recommendations:    Primary osteoarthritis of both knees, left worse than right Orthovisc No. 4 of 4 into the left knee, return as needed.

## 2016-12-25 NOTE — Telephone Encounter (Signed)
Pt's wife left vm stating that his mouth ulcers are no better and that you were going to order some labs.  He has an appt with Dr. Darene Lamer today and she was hoping to get the lab slip then and not have to make two trips.

## 2016-12-25 NOTE — Assessment & Plan Note (Signed)
Orthovisc No. 4 of 4 into the left knee, return as needed.

## 2016-12-25 NOTE — Addendum Note (Signed)
Addended by: Beatrice Lecher D on: 12/25/2016 10:16 PM   Modules accepted: Orders

## 2016-12-26 MED ORDER — TRIAMCINOLONE ACETONIDE 0.1 % MT PSTE: PASTE | OROMUCOSAL | 1 refills | Status: DC

## 2016-12-26 NOTE — Telephone Encounter (Signed)
Lab slip ordered. I was not able to get this phone message until the end of the day.

## 2016-12-26 NOTE — Addendum Note (Signed)
Addended by: Beatrice Lecher D on: 12/26/2016 07:54 AM   Modules accepted: Orders

## 2016-12-26 NOTE — Telephone Encounter (Signed)
See note below. In addition I did send over new prescription for him to try on the ulcers.

## 2016-12-26 NOTE — Telephone Encounter (Signed)
Please call patient back ASAP as original phone call was on 7/12.

## 2016-12-27 LAB — VITAMIN B12: Vitamin B-12: 539 pg/mL (ref 200–1100)

## 2016-12-29 NOTE — Telephone Encounter (Signed)
Pt's wife was notified on 7/13.

## 2016-12-30 LAB — VITAMIN B6: VITAMIN B6: 2 ng/mL — AB (ref 2.1–21.7)

## 2017-01-02 ENCOUNTER — Other Ambulatory Visit: Payer: Self-pay | Admitting: Family Medicine

## 2017-01-29 ENCOUNTER — Ambulatory Visit (INDEPENDENT_AMBULATORY_CARE_PROVIDER_SITE_OTHER): Payer: Medicare Other | Admitting: Family Medicine

## 2017-01-29 ENCOUNTER — Encounter: Payer: Self-pay | Admitting: Family Medicine

## 2017-01-29 VITALS — BP 136/59 | HR 63 | Wt 124.0 lb

## 2017-01-29 DIAGNOSIS — K121 Other forms of stomatitis: Secondary | ICD-10-CM

## 2017-01-29 DIAGNOSIS — R339 Retention of urine, unspecified: Secondary | ICD-10-CM | POA: Diagnosis not present

## 2017-01-29 DIAGNOSIS — F0150 Vascular dementia without behavioral disturbance: Secondary | ICD-10-CM | POA: Diagnosis not present

## 2017-01-29 NOTE — Progress Notes (Signed)
Subjective:    Patient ID: Stanley Meyers, male    DOB: 03-24-1925, 83 y.o.   MRN: [id number]  HPI 83 year old male comes in today to follow-up for his dementia. We have recently increased his Aricept 23 mg and he has been doing well on this. Initially his wife that she noticed some changes but it seems to have settled out  Recently he started to experience some significant hematuria since they went to the hospital. He was seen by urology. He eventually had cystoscopy showing a distended bladder that was causing the bleeding. Initially they thought it was a mass but they did feel that out. They have tried to get him to start urinating on his own but he has not been able to say he's been catheterized twice now. He will go back again to urology on the 29th of this month to see again if he can urinate on his own. If he cannot they may consider procedure just sort of lift and tucked the prostate gland to see if this helps take pressure off the urethra. He was also started on Flomax.  Mouth ulcer -  there is still having difficulty getting his mouth ulcers to heal. It initially started with a large one on the tip of the tongue. It does not keep him from eating but they are sensitive. The one on the tip of the tongue does look a little bit better but is not completely gone. We tried Magic mouthwash. He is Artie gone through 2 bottles of it. His wife has changed his diet to remove any type of ascitic foods. She's been applying coconut oil. She does feel like they look a little better but they're still not completely healing. We've also been trying B6 supplementation. He did have low B6 on recent lab work. B12 was normal.  Review of Systems  BP (!) 136/59   Pulse 63   Wt 124 lb (56.2 kg)   BMI 19.42 kg/m     Allergies  Allergen Reactions  . Gabapentin Swelling  . Linzess [Linaclotide] Nausea And Vomiting    bloating  . Tramadol Other (See Comments)    dizzy    Past Medical History:   Diagnosis Date  . Dementia   . Diverticulosis   . GERD (gastroesophageal reflux disease)   . Hyperlipemia   . Hypertension   . Kidney failure   . Raynaud's disease   . Spasmatic colon     Past Surgical History:  Procedure Laterality Date  . AV FISTULA PLACEMENT    . CHOLECYSTECTOMY    . HERNIA REPAIR      Social History   Social History  . Marital status: Married    Spouse name: Stanley Meyers  . Number of children: N/A  . Years of education: N/A   Occupational History  . Retired     Social History Main Topics  . Smoking status: Former Research scientist (life sciences)  . Smokeless tobacco: Never Used  . Alcohol use 1.2 oz/week    2 Glasses of wine per week  . Drug use: No  . Sexual activity: Not on file   Other Topics Concern  . Not on file   Social History Narrative  . No narrative on file    No family history on file.  Outpatient Encounter Prescriptions as of 01/29/2017  Medication Sig  . acetaminophen (TYLENOL) 325 MG tablet Take 2 tablets (650 mg total) by mouth every 6 (six) hours as needed.  . AMBULATORY NON FORMULARY MEDICATION Manual  wheelchair.  . Calcium-Phosphorus-Vitamin D (CITRACAL +D3 PO) Take by mouth.  . Diclofenac Sodium (PENNSAID) 2 % SOLN APPLY TO AFFECTED AREA TWICE A DAY  . donepezil (ARICEPT) 23 MG TABS tablet TAKE 1 TABLET (23 MG TOTAL) BY MOUTH AT BEDTIME.  . famotidine (PEPCID) 40 MG tablet TAKE 1 TABLET BY MOUTH TWICE A DAY  . memantine (NAMENDA) 10 MG tablet TAKE 1 TABLET (10 MG TOTAL) BY MOUTH 2 (TWO) TIMES DAILY.  Marland Kitchen Pediatric Multivitamins-Iron (CHILD CHEWABLE VITAMINS/IRON PO) Take by mouth.  . Probiotic Product (PROBIOTIC PO) Take by mouth.  . pyridOXINE (VITAMIN B-6) 100 MG tablet Take 100 mg by mouth daily.  . tamsulosin (FLOMAX) 0.4 MG CAPS capsule Take by mouth.  . [DISCONTINUED] Diphenhyd-Hydrocort-Nystatin (FIRST-DUKES MOUTHWASH) SUSP Swish and spit with 5 ml's up to 4 times a day as needed.  . [DISCONTINUED] magic mouthwash w/lidocaine SOLN Mix 1:1:1  diphenhydramine 12.5 g per 5 mL one part: Viscous lidocaine 2% 1 part: Maalox 1 part.  Swish and spit 5 mLs up to 4 times a day as needed.  . [DISCONTINUED] mupirocin ointment (BACTROBAN) 2 % Apply topically 2 (two) times daily.  . [DISCONTINUED] triamcinolone (KENALOG) 0.1 % paste Apply up to 3 times a day to ulcers in mouth.   No facility-administered encounter medications on file as of 01/29/2017.          Objective:   Physical Exam  Constitutional: He is oriented to person, place, and time. He appears well-developed and well-nourished.  HENT:  Head: Normocephalic and atraumatic.  Ill has a large ulcer on the tip of the tongue on the left side of the mouth. Doesn't really look much smaller in regards to the borders. But it does look less shallow than it was previously.  Eyes: Conjunctivae and EOM are normal.  Cardiovascular: Normal rate.   Pulmonary/Chest: Effort normal.  Neurological: He is alert and oriented to person, place, and time.  Skin: Skin is dry. No pallor.  Psychiatric: He has a normal mood and affect. His behavior is normal.  Vitals reviewed.      Assessment & Plan:  Mouth ulcer-not healing-at this point recommendation would be for biopsy for further evaluation to rule out oral cancer. Will try contacting his dentist, Dr. Evelene Croon in [place] first. To see if he would be willing to do the biopsy or if he would recommend someone such as an oral surgeon that he works with.  Urinary retention with gross hematuria-he'll go back again on the 29th to see if he is able to urinate on his own if not then they may try to consider doing a newer prostate lift surgery versus chronic catheterization. He is on the Flomax just encouraged his wife to monitor for any lightheadedness or dizziness or falls.  Dementia-stable. Continue with Aricept 23 mg daily he's tolerated it well so far. Follow-up in 4 months.

## 2017-01-30 ENCOUNTER — Other Ambulatory Visit: Payer: Self-pay

## 2017-01-30 DIAGNOSIS — K140 Glossitis: Secondary | ICD-10-CM

## 2017-02-19 LAB — CBC AND DIFFERENTIAL
HEMATOCRIT: 29 — AB (ref 41–53)
HEMOGLOBIN: 9.7 — AB (ref 13.5–17.5)
NEUTROS ABS: 61
Platelets: 127 — AB (ref 150–399)
WBC: 7.3

## 2017-02-19 LAB — BASIC METABOLIC PANEL
ALBUMIN: 4
BUN: 33 — AB (ref 4–21)
CALCIUM: 10
CHLORIDE: 107
CO2: 26
Creatinine: 2.1 — AB (ref 0.6–1.3)
EGFR (African American): 32
EGFR (Non-African Amer.): 28
GLUCOSE: 118
PHOSPHORUS: 3.6
Potassium: 5.2 (ref 3.4–5.3)
Sodium: 141 (ref 137–147)

## 2017-02-25 ENCOUNTER — Encounter: Payer: Self-pay | Admitting: Family Medicine

## 2017-02-26 ENCOUNTER — Other Ambulatory Visit: Payer: Self-pay

## 2017-02-26 DIAGNOSIS — N185 Chronic kidney disease, stage 5: Secondary | ICD-10-CM

## 2017-02-26 DIAGNOSIS — Z01810 Encounter for preprocedural cardiovascular examination: Secondary | ICD-10-CM

## 2017-03-02 ENCOUNTER — Ambulatory Visit (INDEPENDENT_AMBULATORY_CARE_PROVIDER_SITE_OTHER): Payer: Medicare Other

## 2017-03-02 ENCOUNTER — Other Ambulatory Visit: Payer: Self-pay | Admitting: Sports Medicine

## 2017-03-02 ENCOUNTER — Ambulatory Visit (HOSPITAL_BASED_OUTPATIENT_CLINIC_OR_DEPARTMENT_OTHER): Payer: Medicare Other

## 2017-03-02 ENCOUNTER — Other Ambulatory Visit: Payer: Self-pay | Admitting: Family Medicine

## 2017-03-02 ENCOUNTER — Ambulatory Visit (INDEPENDENT_AMBULATORY_CARE_PROVIDER_SITE_OTHER): Payer: Medicare Other | Admitting: Sports Medicine

## 2017-03-02 DIAGNOSIS — M85871 Other specified disorders of bone density and structure, right ankle and foot: Secondary | ICD-10-CM

## 2017-03-02 DIAGNOSIS — S99921A Unspecified injury of right foot, initial encounter: Secondary | ICD-10-CM

## 2017-03-02 NOTE — Assessment & Plan Note (Addendum)
Pain at the base of the fifth metatarsal after an accidental fall a week ago.  X-rays.  No fractures noted, this is likely a sprain, we will give it a week or 2. He also has a boutonniere's deformity of the second toe, I would like podiatry to weigh in here.

## 2017-03-02 NOTE — Progress Notes (Signed)
   Subjective:    I'm seeing this patient as a consultation for:  Dr. Beatrice Lecher  CC: Right foot injury  HPI: 1 week ago this pleasant 83 year old male fell, everting his right foot, he was treated conservatively at home, she did develop some swelling and bruising, and is brought in for further evaluation. Pain is moderate, improving.  Toe pain: Left-sided, second toe at the PIP. He does have a known boutonniere's deformity, top of the toe seems to be rubbing on footwear. There is no pain however the patient's wife is looking to get this intervened with before it becomes infected.  Past medical history, Surgical history, Family history not pertinant except as noted below, Social history, Allergies, and medications have been entered into the medical record, reviewed, and no changes needed.   Review of Systems: No headache, visual changes, nausea, vomiting, diarrhea, constipation, dizziness, abdominal pain, skin rash, fevers, chills, night sweats, weight loss, swollen lymph nodes, body aches, joint swelling, muscle aches, chest pain, shortness of breath, mood changes, visual or auditory hallucinations.   Objective:   General: Well Developed, well nourished, and in no acute distress.  Neuro:  Extra-ocular muscles intact, able to move all 4 extremities, sensation grossly intact.  Deep tendon reflexes tested were normal. Psych: Alert and oriented, mood congruent with affect. ENT:  Ears and nose appear unremarkable.  Hearing grossly normal. Neck: Unremarkable overall appearance, trachea midline.  No visible thyroid enlargement. Eyes: Conjunctivae and lids appear unremarkable.  Pupils equal and round. Skin: Warm and dry, no rashes noted.  Cardiovascular: Pulses palpable, no extremity edema. Right Foot: Minimal swelling, erythema and bruising over the base of the fifth metatarsal Range of motion is full in all directions. Strength is 5/5 in all directions. No hallux valgus. No pes cavus  or pes planus. No abnormal callus noted. No pain over the navicular prominence, or base of fifth metatarsal. No tenderness to palpation of the calcaneal insertion of plantar fascia. No pain at the Achilles insertion. No pain over the calcaneal bursa. No pain of the retrocalcaneal bursa. No tenderness to palpation over the tarsals, metatarsals, or phalanges. No hallux rigidus or limitus. No tenderness palpation over interphalangeal joints. No pain with compression of the metatarsal heads. Neurovascularly intact distally. Left foot: Visible boutonniere's deformity, left second toe, overlying mild skin breakdown at the PIP.  X-rays personally reviewed, negative for fracture.  Impression and Recommendations:   This case required medical decision making of moderate complexity.  Right foot injury Pain at the base of the fifth metatarsal after an accidental fall a week ago.  X-rays.  No fractures noted, this is likely a sprain, we will give it a week or 2. He also has a boutonniere's deformity of the second toe, I would like podiatry to weigh in here.  ___________________________________________ Gwen Her. Dianah Field, M.D., ABFM., CAQSM. Primary Care and [place] Instructor of [employer] [place] [employer] [facility]

## 2017-03-12 NOTE — Discharge Instructions (Signed)

## 2017-03-13 ENCOUNTER — Ambulatory Visit (HOSPITAL_COMMUNITY)
Admission: RE | Admit: 2017-03-13 | Discharge: 2017-03-13 | Disposition: A | Discharge status: Home / Self Care | Payer: Medicare Other | Source: Ambulatory Visit | Attending: Nephrology | Admitting: Nephrology

## 2017-03-13 DIAGNOSIS — Z5181 Encounter for therapeutic drug level monitoring: Secondary | ICD-10-CM | POA: Diagnosis not present

## 2017-03-13 DIAGNOSIS — Z79899 Other long term (current) drug therapy: Secondary | ICD-10-CM | POA: Insufficient documentation

## 2017-03-13 DIAGNOSIS — D631 Anemia in chronic kidney disease: Secondary | ICD-10-CM | POA: Insufficient documentation

## 2017-03-13 DIAGNOSIS — N184 Chronic kidney disease, stage 4 (severe): Secondary | ICD-10-CM | POA: Insufficient documentation

## 2017-03-13 LAB — POCT HEMOGLOBIN-HEMACUE: Hemoglobin: 10.8 g/dL — ABNORMAL LOW (ref 13.0–17.0)

## 2017-03-13 MED ORDER — DARBEPOETIN ALFA 100 MCG/0.5ML IJ SOSY
100.0000 ug | PREFILLED_SYRINGE | INTRAMUSCULAR | Status: DC
  Administered 2017-03-13: 100 ug via SUBCUTANEOUS

## 2017-03-13 MED ORDER — DARBEPOETIN ALFA 100 MCG/0.5ML IJ SOSY
PREFILLED_SYRINGE | INTRAMUSCULAR | Status: AC
  Administered 2017-03-13: 100 ug via SUBCUTANEOUS
  Filled 2017-03-13: qty 0.5

## 2017-03-16 ENCOUNTER — Ambulatory Visit (INDEPENDENT_AMBULATORY_CARE_PROVIDER_SITE_OTHER): Payer: Medicare Other | Admitting: Podiatry

## 2017-03-16 ENCOUNTER — Encounter: Payer: Self-pay | Admitting: Podiatry

## 2017-03-16 VITALS — BP 133/56 | HR 61 | Ht 65.0 in | Wt 123.0 lb

## 2017-03-16 DIAGNOSIS — I739 Peripheral vascular disease, unspecified: Secondary | ICD-10-CM

## 2017-03-16 DIAGNOSIS — M2041 Other hammer toe(s) (acquired), right foot: Secondary | ICD-10-CM

## 2017-03-16 DIAGNOSIS — M7989 Other specified soft tissue disorders: Secondary | ICD-10-CM | POA: Diagnosis not present

## 2017-03-16 DIAGNOSIS — M79674 Pain in right toe(s): Secondary | ICD-10-CM | POA: Diagnosis not present

## 2017-03-16 NOTE — Progress Notes (Signed)
SUBJECTIVE: 83 y.o. year old male presents complaining of pain on both feet. Positive history of bad sprain on right foot 2 weeks ago and still having pain. Has Raynaud's and feet are purple and cold.  Having problem with buckling knees.   REVIEW OF SYSTEMS: Constitutional: negative Ears, nose, mouth, throat, and face: negative Respiratory: Diagnosed with Reynaud's disease. Feet stays cold. Cardiovascular: negative Gastrointestinal: negative Musculoskeletal:Difficulty waking from bad knee and recent foot injury. Using walker. Allergic/Immunologic: negative  OBJECTIVE: DERMATOLOGIC EXAMINATION: No open skin lesions. Friction caused pain over PIPJ 2nd toe right.   VASCULAR EXAMINATION OF LOWER LIMBS: All pedal pulses are faintly palpable bilateral.  Distal end of extremities cool to touch with positional purple discoloration.  NEUROLOGIC EXAMINATION OF THE LOWER LIMBS: Achilles DTR is present and within normal. Monofilament (Semmes-Weinstein 10-gm) sensory testing positive 6 out of 6, bilateral. Vibratory sensations(128Hz  turning fork) intact at medial and lateral forefoot bilateral.  Sharp and Dull discriminatory sensations at the plantar ball of hallux is intact bilateral.   MUSCULOSKELETAL EXAMINATION: Positive for contracted 2nd digit at PIPJ with pain in closed in shoes.   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 Pain with ambulation. PVD/Raynaud's syndrome.  PLAN: Reviewed findings and available treatment options. Aperture padding dispensed. Proper shoe gear discussed. Wife will keep close eye on the toe. Return as needed.

## 2017-03-16 NOTE — Patient Instructions (Signed)
Seen for painful toes 2nd right and 5th left. Reviewed findings and home care instruction with felt pad and corn pad. Return as needed.

## 2017-03-17 ENCOUNTER — Ambulatory Visit (INDEPENDENT_AMBULATORY_CARE_PROVIDER_SITE_OTHER): Payer: Medicare Other | Admitting: Family Medicine

## 2017-03-17 ENCOUNTER — Ambulatory Visit (INDEPENDENT_AMBULATORY_CARE_PROVIDER_SITE_OTHER): Payer: Medicare Other

## 2017-03-17 VITALS — BP 140/63 | HR 125 | Temp 97.8°F | Wt 119.0 lb

## 2017-03-17 DIAGNOSIS — M8588 Other specified disorders of bone density and structure, other site: Secondary | ICD-10-CM

## 2017-03-17 DIAGNOSIS — Z23 Encounter for immunization: Secondary | ICD-10-CM | POA: Diagnosis not present

## 2017-03-17 DIAGNOSIS — M533 Sacrococcygeal disorders, not elsewhere classified: Secondary | ICD-10-CM

## 2017-03-17 DIAGNOSIS — R29898 Other symptoms and signs involving the musculoskeletal system: Secondary | ICD-10-CM | POA: Diagnosis not present

## 2017-03-17 NOTE — Progress Notes (Signed)
Subjective:      CC: back pain  HPI:  Patient has a history of chronic back pain but notes a significant increase in severity of pain over the last week. He has also noticed a new sensation that his knees are going to buckle and give out on him. Patient localizes the pain to the area overlying the coccyx. Patient notes difficulty sitting on toilet and has been using a padded seat. He has also noted a significant decline in his ability to walk. Patient has tried applying Salonpas pads and diclofenac gel to the area, but has not experienced significant relief. Patient reports some numbness in lower extremities that radiates past the knee.   Past medical history, Surgical history, Family history not pertinant except as noted below, Social history, Allergies, and medications have been entered into the medical record, reviewed, and no changes needed.   Review of Systems: No headache, visual changes, nausea, vomiting, diarrhea, constipation, dizziness, abdominal pain, skin rash, fevers, chills, night sweats, weight loss, swollen lymph nodes, body aches, joint swelling, muscle aches, chest pain, shortness of breath, mood changes, visual or auditory hallucinations.   Objective:    Vitals:   03/17/17 1446  BP: 140/63  Pulse: (!) 125  Temp: 97.8 F (36.6 C)   General: Well Developed, well nourished, and in no acute distress.  Neuro/Psych: Alert, extra-ocular muscles intact, able to move all 4 extremities, sensation grossly intact. Skin: Warm and dry, no rashes noted.  Respiratory: Not using accessory muscles, speaking in full sentences, trachea midline.  Cardiovascular: Pulses palpable, no extremity edema. Abdomen: Does not appear distended. MSK: No gross deformity on inspection Tenderness to palpation at area of coccyx, lower extremity sensation intact Strength is 5/5 with hip abduction and adduction, knee flexion and extension, and ankle dorsiflexion and plantar flexion, patient requires  assistance to stand, able to ambulate with use of rolling walker  X-ray coccyx pending no obvious fracture seen.   Impression and Recommendations:    Assessment and Plan: 83 y.o. male with acute worsening of back pain. Patient underwent x-ray of sacrum and coccyx today. Initial read of x-ray did not show any evidence of fracture. Final read is pending. This is most likely an acute inflammation of muscles inserting on the coccyx. Patient was referred for Home Health physical therapy. Patient will be re-evaluated in 4 weeks to assess for improvement in symptoms. If symptoms do not improve, more advanced imaging such as MRI may be indicated to further assess for other potential etiologies of pain.   Influenza vaccine given prior to discharge  Orders Placed This Encounter  Procedures  . DG Sacrum/Coccyx    Standing Status:   Future    Number of Occurrences:   1    Standing Expiration Date:   05/17/2018    Order Specific Question:   Reason for Exam (SYMPTOM  OR DIAGNOSIS REQUIRED)    Answer:   eval coccyx pain    Order Specific Question:   Preferred imaging location?    Answer:   [facility]    Order Specific Question:   Radiology Contrast Protocol - do NOT remove file path    Answer:   \\[email or web address]  . Flu vaccine HIGH DOSE PF (Fluzone High dose)  . Ambulatory referral to Home Health    Referral Priority:   Routine    Referral Type:   Home Health Care    Referral Reason:   Specialty Services Required    Requested Specialty:   [facility]  [facility]    Number of Visits Requested:   1   No orders of the defined types were placed in this encounter.   Discussed warning signs or symptoms. Please see discharge instructions. Patient expresses understanding.

## 2017-03-17 NOTE — Patient Instructions (Signed)
Thank you for coming in today. Use over the counter aspercream.  Do home health PT.  Recheck with me in 4 weeks.  If worsening we will order a MRI sooner.    Tailbone Injury The tailbone (coccyx) is the small bone at the lower end of the spine. A tailbone injury may involve stretched ligaments, bruising, or a broken bone (fracture). Tailbone injuries can be painful, and some may take a long time to heal. What are the causes? This condition is often caused by falling and landing on the tailbone. Other causes include:  Repeated strain or friction from actions such as rowing and bicycling.  Childbirth.  In some cases, the cause may not be known. What increases the risk? This condition is more common in women than in men. What are the signs or symptoms? Symptoms of this condition include:  Pain in the lower back, especially when sitting.  Pain or difficulty when standing up from a sitting position.  Bruising in the tailbone area.  Painful bowel movements.  In women, pain during intercourse.  How is this diagnosed? This condition may be diagnosed based on your symptoms and a physical exam. X-rays may be taken if a fracture is suspected. You may also have other tests, such as a CT scan or MRI. How is this treated? This condition may be treated with medicines to help relieve your pain. Most tailbone injuries heal on their own in 4-6 weeks. However, recovery time may be longer if the injury involves a fracture. Follow these instructions at home:  Take medicines only as directed by your health care provider.  If directed, apply ice to the injured area: ? Put ice in a plastic bag. ? Place a towel between your skin and the bag. ? Leave the ice on for 20 minutes, 2-3 times per day for the first 1-2 days.  Sit on a large, rubber or inflated ring or cushion to ease your pain. Lean forward when you are sitting to help decrease discomfort.  Avoid sitting for long periods of  time.  Increase your activity as the pain allows. Perform any exercises that are recommended by your health care provider or physical therapist.  If you have pain during bowel movements, use stool softeners as directed by your health care provider.  Eat a diet that includes plenty of fiber to help prevent constipation.  Keep all follow-up visits as directed by your health care provider. This is important. How is this prevented? Wear appropriate padding and sports gear when bicycling and rowing. This can help to prevent developing an injury that is caused by repeated strain or friction. Contact a health care provider if:  Your pain becomes worse.  Your bowel movements cause a great deal of discomfort.  You are unable to have a bowel movement.  You have uncontrolled urine loss (urinary incontinence).  You have a fever. This information is not intended to replace advice given to you by your health care provider. Make sure you discuss any questions you have with your health care provider. Document Released: 05/30/2000 Document Revised: 01/31/2016 Document Reviewed: 05/29/2014 Elsevier Interactive Patient Education  [employer].

## 2017-03-23 ENCOUNTER — Telehealth: Payer: Self-pay

## 2017-03-23 NOTE — Telephone Encounter (Signed)
PT called requesting an order for gait training and strengthening.  BID X 4 weeks.  Order given.

## 2017-03-30 ENCOUNTER — Telehealth: Payer: Self-pay

## 2017-03-30 DIAGNOSIS — M533 Sacrococcygeal disorders, not elsewhere classified: Secondary | ICD-10-CM

## 2017-03-30 NOTE — Telephone Encounter (Signed)
CT has been authorized by insurance. When imaging called Pt's wife to schedule, she was confused on why a CT was ordered instead of a MRI. Will route.

## 2017-03-30 NOTE — Telephone Encounter (Signed)
Left detailed vm requesting a call back 

## 2017-03-30 NOTE — Telephone Encounter (Signed)
Back pain worse.  Plan Pelvis CT scan.  Follow up after results.

## 2017-03-30 NOTE — Telephone Encounter (Signed)
pts wife called stating that pt is having increased pain and would like to proceed with MRI sooner. Please advise.

## 2017-03-31 ENCOUNTER — Other Ambulatory Visit: Payer: Self-pay | Admitting: Family Medicine

## 2017-03-31 MED ORDER — AMBULATORY NON FORMULARY MEDICATION: 0 refills | Status: DC

## 2017-03-31 NOTE — Telephone Encounter (Signed)
I have two reasons why I ordered a CT.   1) We are looking for a potential fracture and CT will show this will.  2) MRI is hard to get scheduled and approved in this situation. We can use the CT scan as a stepping stone to rule out lots of pain causes and prove that we will need a MRI if not better.

## 2017-03-31 NOTE — Telephone Encounter (Signed)
Pt's wife advised, she is agreeable to get CT scan. Imaging notified.

## 2017-04-03 ENCOUNTER — Ambulatory Visit (INDEPENDENT_AMBULATORY_CARE_PROVIDER_SITE_OTHER): Payer: Medicare Other

## 2017-04-03 ENCOUNTER — Other Ambulatory Visit: Payer: Self-pay

## 2017-04-03 DIAGNOSIS — M4187 Other forms of scoliosis, lumbosacral region: Secondary | ICD-10-CM

## 2017-04-03 DIAGNOSIS — M533 Sacrococcygeal disorders, not elsewhere classified: Secondary | ICD-10-CM | POA: Diagnosis not present

## 2017-04-03 MED ORDER — AMBULATORY NON FORMULARY MEDICATION: 0 refills | Status: AC

## 2017-04-07 ENCOUNTER — Ambulatory Visit (INDEPENDENT_AMBULATORY_CARE_PROVIDER_SITE_OTHER)
Admission: RE | Admit: 2017-04-07 | Discharge: 2017-04-07 | Disposition: A | Discharge status: Home / Self Care | Payer: Medicare Other | Source: Ambulatory Visit | Attending: Vascular Surgery | Admitting: Vascular Surgery

## 2017-04-07 ENCOUNTER — Ambulatory Visit (HOSPITAL_COMMUNITY)
Admission: RE | Admit: 2017-04-07 | Discharge: 2017-04-07 | Disposition: A | Discharge status: Home / Self Care | Payer: Medicare Other | Source: Ambulatory Visit | Attending: Vascular Surgery | Admitting: Vascular Surgery

## 2017-04-07 ENCOUNTER — Ambulatory Visit (INDEPENDENT_AMBULATORY_CARE_PROVIDER_SITE_OTHER): Payer: Medicare Other | Admitting: Vascular Surgery

## 2017-04-07 ENCOUNTER — Encounter: Payer: Medicare Other | Admitting: Vascular Surgery

## 2017-04-07 ENCOUNTER — Other Ambulatory Visit (HOSPITAL_COMMUNITY): Payer: Medicare Other

## 2017-04-07 ENCOUNTER — Encounter (HOSPITAL_COMMUNITY): Payer: Medicare Other

## 2017-04-07 ENCOUNTER — Encounter: Payer: Self-pay | Admitting: Vascular Surgery

## 2017-04-07 VITALS — BP 157/73 | HR 60 | Temp 97.4°F | Resp 16 | Ht 65.0 in | Wt 120.0 lb

## 2017-04-07 DIAGNOSIS — Z01810 Encounter for preprocedural cardiovascular examination: Secondary | ICD-10-CM | POA: Diagnosis not present

## 2017-04-07 DIAGNOSIS — N184 Chronic kidney disease, stage 4 (severe): Secondary | ICD-10-CM | POA: Diagnosis not present

## 2017-04-07 DIAGNOSIS — N185 Chronic kidney disease, stage 5: Secondary | ICD-10-CM | POA: Diagnosis not present

## 2017-04-07 NOTE — Progress Notes (Signed)
HISTORY AND PHYSICAL     CC:  Possibly removing right arm fistula Requesting Provider:  Hali Marry, *  HPI: This is a 83 y.o. male who was referred by Dr. Lorrene Reid.  ~ 10 years ago, his renal function worsened and it was thought he may need dialysis and a right RC AVF was placed by Dr Kellie Simmering on 11/04/06.  His renal function has improved and he never required dialysis.  His right arm fistula remains functioning.  His wife states that he has Raynaud's Disease and both his hands are cold and it is starting in his feet.  He does have some swelling in the right hand, which she states has been progressive over the years and not sudden.   She states that he did have some weeping from his arm a couple of times in the past 2 months.    She is concerned that if he did need surgery, he would have a difficult time with healing.  He had a bursa removed from his right elbow that took 3 years to heal.  She states that small scrapes or cuts on his arms and legs take time healing.  She is also concerned that his legs have recently started buckling and he is now in a wheelchair.  He was using a Corporate investment banker.  He has a chronic indwelling foley catheter due to BPH.   She states that his dementia is worsening as well.    Dr. Lorrene Reid referred the pt for potential ligation of fistula since he is not going to be needing it.    Past Medical History:  Diagnosis Date  . Dementia   . Diverticulosis   . GERD (gastroesophageal reflux disease)   . Hyperlipemia   . Hypertension   . Kidney failure   . Raynaud's disease   . Spasmatic colon     Past Surgical History:  Procedure Laterality Date  . AV FISTULA PLACEMENT    . CHOLECYSTECTOMY    . HERNIA REPAIR      Allergies  Allergen Reactions  . Gabapentin Swelling  . Linzess [Linaclotide] Nausea And Vomiting    bloating  . Tramadol Other (See Comments)    dizzy    Current Outpatient Prescriptions  Medication Sig Dispense Refill  . acetaminophen (TYLENOL)  325 MG tablet Take 2 tablets (650 mg total) by mouth every 6 (six) hours as needed. 30 tablet 0  . AMBULATORY NON FORMULARY MEDICATION Manual wheelchair. 1 each 0  . AMBULATORY NON FORMULARY MEDICATION Medication Name: Youth worker wheelchair. Dx. Lower extremity weakness and recurrent falls. 1 Units 0  . Calcium-Phosphorus-Vitamin D (CITRACAL +D3 PO) Take by mouth.    . Diclofenac Sodium (PENNSAID) 2 % SOLN APPLY TO AFFECTED AREA TWICE A DAY 112 g 11  . donepezil (ARICEPT) 23 MG TABS tablet TAKE 1 TABLET (23 MG TOTAL) BY MOUTH AT BEDTIME. 30 tablet 1  . famotidine (PEPCID) 40 MG tablet TAKE 1 TABLET BY MOUTH TWICE A DAY 180 tablet 0  . furosemide (LASIX) 20 MG tablet Take 20 mg by mouth as needed for edema.    . memantine (NAMENDA) 10 MG tablet TAKE 1 TABLET (10 MG TOTAL) BY MOUTH 2 (TWO) TIMES DAILY. 60 tablet 6  . Pediatric Multivitamins-Iron (CHILD CHEWABLE VITAMINS/IRON PO) Take by mouth.    . Probiotic Product (PROBIOTIC PO) Take by mouth.    . pyridOXINE (VITAMIN B-6) 100 MG tablet Take 100 mg by mouth daily.     No current facility-administered medications for  this visit.     History reviewed. No pertinent family history.  Social History   Social History  . Marital status: Married    Spouse name: Vira Agar  . Number of children: N/A  . Years of education: N/A   Occupational History  . Retired     Social History Main Topics  . Smoking status: Former Research scientist (life sciences)  . Smokeless tobacco: Never Used  . Alcohol use 1.2 oz/week    2 Glasses of wine per week  . Drug use: No  . Sexual activity: Not on file   Other Topics Concern  . Not on file   Social History Narrative  . No narrative on file     REVIEW OF SYSTEMS:   [X]  denotes positive finding, [ ]  denotes negative finding Cardiac  Comments:  Chest pain or chest pressure:    Shortness of breath upon exertion: x   Short of breath when lying flat:    Irregular heart rhythm:    Right BBB x   Vascular    Pain in  calf, thigh, or hip brought on by ambulation: x   Pain in feet at night that wakes you up from your sleep:     Blood clot in your veins:    Leg swelling:     Raynaud's Disease x   Pulmonary    Oxygen at home:    Productive cough:     Wheezing:         Neurologic    Sudden weakness in arms or legs:   Legs-See HPI  Sudden numbness in arms or legs:     Sudden onset of difficulty speaking or slurred speech:    Temporary loss of vision in one eye:     Problems with dizziness:         Gastrointestinal    Blood in stool:     Vomited blood:         Genitourinary    Burning when urinating:     Blood in urine:    Chronic foley x BPH  Psychiatric    Major depression:         Hematologic    Bleeding problems:    Problems with blood clotting too easily:        Skin    Rashes or ulcers:        Constitutional    Fever or chills:      PHYSICAL EXAMINATION:  Vitals:   04/07/17 1251 04/07/17 1252  BP: (!) 160/67 (!) 157/73  Pulse: 60   Resp: 16   Temp: (!) 97.4 F (36.3 C)    Vitals:   04/07/17 1251  Weight: 120 lb (54.4 kg)  Height: 5\' 5"  (1.651 m)   Body mass index is 19.97 kg/m.  General:  WDWN in NAD; vital signs documented above Gait: In wheelchair HENT: WNL, normocephalic Pulmonary: normal non-labored breathing Cardiac: regular HR, without  Murmurs without carotid bruits Skin: without rashes Vascular Exam/Pulses:  Right Left  Radial 2+ (normal) 2+ (normal)   Extremities: matured right RC AVF that extends to upper arm.  Excellent thrill/bruit within fistula; fingers cyanotic bilaterally; some swelling of the right hand. Musculoskeletal: no muscle wasting or atrophy  Neurologic: A&O X 3;  No focal weakness or paresthesias are detected Psychiatric:  The pt has Normal affect.    Pt meds includes: Statin:  No. Beta Blocker:  No. Aspirin:  No. ACEI:  No. ARB:  No. CCB use:  No Other Antiplatelet/Anticoagulant:  No  ASSESSMENT/PLAN:: 83 y.o. male with  CKD 4 with functioning right arm RC AVF   -pt was referred for evaluation for ligation of the right arm fistula as pt has Raynaud's Disease and is not going to be on dialysis.  Pt and wife do not notice a difference in the right and left hand.  Dr. Donnetta Hutching does not feel that ligation of the fistula is going to make a difference in his hands.  If this becomes an issue and needs ligation in the future, we can proceed at that time.  He has more pressing issues at this time with balance/gait and his progressing dementia.   Leontine Locket, PA-C [facility] [phone]  Clinic MD:  Pt seen and examined with Dr. Donnetta Hutching  I have examined the patient, reviewed and agree with above.  Discussed at length with the patient and his wife present.  Explained to be quite simple to ligate his right arm AV fistula.  He does not have any lateralizing symptoms in his right versus left hand with swelling and occasional cyanosis.  Not sure that this would make much of an impact if any regarding his arm symptoms.  His wife is somewhat overwhelmed regarding his multiple medical issues including progressive dementia.  She would like to defer any surgical treatment if at all possible.  I feel that this certainly is reasonable since not sure that this would give him any improvement.  Certainly is not causing him any disability currently.  Curt Jews, MD 04/07/2017 2:01 PM

## 2017-04-09 ENCOUNTER — Ambulatory Visit (INDEPENDENT_AMBULATORY_CARE_PROVIDER_SITE_OTHER): Payer: Medicare Other | Admitting: Family Medicine

## 2017-04-09 ENCOUNTER — Encounter: Payer: Self-pay | Admitting: Nephrology

## 2017-04-09 ENCOUNTER — Encounter: Payer: Self-pay | Admitting: Family Medicine

## 2017-04-09 VITALS — BP 152/71 | HR 65

## 2017-04-09 DIAGNOSIS — M5441 Lumbago with sciatica, right side: Secondary | ICD-10-CM | POA: Diagnosis not present

## 2017-04-09 DIAGNOSIS — M5442 Lumbago with sciatica, left side: Secondary | ICD-10-CM | POA: Diagnosis not present

## 2017-04-09 MED ORDER — PREDNISONE 5 MG (48) PO TBPK: ORAL_TABLET | ORAL | 0 refills | Status: DC

## 2017-04-09 MED ORDER — HYDROCODONE-ACETAMINOPHEN 5-325 MG PO TABS: 1.0000 | ORAL_TABLET | Freq: Four times a day (QID) | ORAL | 0 refills | Status: DC | PRN

## 2017-04-09 NOTE — Progress Notes (Signed)
Stanley Meyers is a 83 y.o. male who presents to [facility] today for back pain and lumbar radiating pain.  Patient was seen October 2 for sacral pain thought to be coccydynia.  He had a fall previously and when I was concerned for an occult fracture.  A CT scan was obtained which shows degenerative changes in his lumbar sacral spine with no fracture.  Unfortunately his pain has worsened and he now has pain radiating this.  He denies any new bowel or bladder dysfunction or severe weakness.  He does have some chronic trouble standing and requires assistance at times.  This is not a significantly new change.  He denies any saddle anesthesia.   Past Medical History:  Diagnosis Date  . Dementia   . Diverticulosis   . GERD (gastroesophageal reflux disease)   . Hyperlipemia   . Hypertension   . Kidney failure   . Raynaud's disease   . Spasmatic colon    Past Surgical History:  Procedure Laterality Date  . AV FISTULA PLACEMENT    . CHOLECYSTECTOMY    . HERNIA REPAIR     Social History  Substance Use Topics  . Smoking status: Former Research scientist (life sciences)  . Smokeless tobacco: Never Used  . Alcohol use 1.2 oz/week    2 Glasses of wine per week     ROS:  As above   Medications: Current Outpatient Prescriptions  Medication Sig Dispense Refill  . acetaminophen (TYLENOL) 325 MG tablet Take 2 tablets (650 mg total) by mouth every 6 (six) hours as needed. 30 tablet 0  . AMBULATORY NON FORMULARY MEDICATION Manual wheelchair. 1 each 0  . AMBULATORY NON FORMULARY MEDICATION Medication Name: Youth worker wheelchair. Dx. Lower extremity weakness and recurrent falls. 1 Units 0  . Calcium-Phosphorus-Vitamin D (CITRACAL +D3 PO) Take by mouth.    . Diclofenac Sodium (PENNSAID) 2 % SOLN APPLY TO AFFECTED AREA TWICE A DAY 112 g 11  . donepezil (ARICEPT) 23 MG TABS tablet TAKE 1 TABLET (23 MG TOTAL) BY MOUTH AT BEDTIME. 30 tablet 1  . famotidine (PEPCID) 40  MG tablet TAKE 1 TABLET BY MOUTH TWICE A DAY 180 tablet 0  . furosemide (LASIX) 20 MG tablet Take 20 mg by mouth as needed for edema.    Mar[facility] Kitchen HYDROcodone-acetaminophen (NORCO/VICODIN) 5-325 MG tablet Take 1 tablet by mouth every 6 (six) hours as needed. 15 tablet 0  . memantine (NAMENDA) 10 MG tablet TAKE 1 TABLET (10 MG TOTAL) BY MOUTH 2 (TWO) TIMES DAILY. 60 tablet 6  . Pediatric Multivitamins-Iron (CHILD CHEWABLE VITAMINS/IRON PO) Take by mouth.    . predniSONE (STERAPRED UNI-PAK 48 TAB) 5 MG (48) TBPK tablet 12 day dosepack po 48 tablet 0  . Probiotic Product (PROBIOTIC PO) Take by mouth.    . pyridOXINE (VITAMIN B-6) 100 MG tablet Take 100 mg by mouth daily.     No current facility-administered medications for this visit.    Allergies  Allergen Reactions  . Gabapentin Swelling  . Linzess [Linaclotide] Nausea And Vomiting    bloating  . Tramadol Other (See Comments)    dizzy     Exam:  BP (!) 152/71   Pulse 65  General: Well Developed, well nourished, and in no acute distress.  Neuro/Psych: Alert and oriented x3, extra-ocular muscles intact, able to move all 4 extremities, sensation grossly intact. Skin: Warm and dry, no rashes noted.  Respiratory: Not using accessory muscles, speaking in full sentences, trachea midline.  Cardiovascular: Pulses palpable, no extremity edema. Abdomen: Does not appear distended. MSK:  L-spine nontender to midline diffusely tender lumbar paraspinal muscle group. Lower extremity strength is diminished throughout lightly. Sensation is intact throughout.  Study Result   CLINICAL DATA:  83 year old male post fall with coccygeal pain. Subsequent encounter.  EXAM: CT PELVIS WITHOUT CONTRAST  TECHNIQUE: Multidetector CT imaging of the pelvis was performed following the standard protocol without intravenous contrast.  COMPARISON:  03/17/2017 plain film exam.  09/27/2012 CT.  FINDINGS: Urinary Tract:  Foley catheter in place with  decompressed bladder.  Bowel:  Prominent sigmoid diverticulosis.  Vascular/Lymphatic: Calcified ectatic iliac arteries with right common iliac artery measuring up to 1.7 cm without significant change.  Reproductive:  Calcified prostate gland.  Other:  Negative.  Musculoskeletal: No fracture is identified. Specifically, no coccygeal/sacral fracture noted. As a bones are osteopenic, if the patient had persistent or progressive symptoms and further delineation were clinically desired, MR would be the next imaging exam as this would prove more sensitive for subtle fracture/impaction injury.  Scoliosis lower lumbar spine with superimposed marked degenerative changes L3-4, L4-5 and L5-S1. Prominent degenerative changes right L5-S1 articulation.  Stable appearance of nonspecific sclerotic focus right ilium.  IMPRESSION: No fracture is identified. Specifically, no coccygeal/sacral fracture noted. As a bones are osteopenic, if the patient had persistent or progressive symptoms and further delineation were clinically desired, MR would be the next imaging exam as this would prove more sensitive for subtle fracture/impaction injury.  Scoliosis lower lumbar spine with superimposed marked degenerative changes L3-4, L4-5 and L5-S1. Prominent degenerative changes right L5-S1 articulation.   Electronically Signed   By: Genia Del M.D.   On: 04/03/2017 13:03       No results found for this or any previous visit (from the past 48 hour(s)). No results found.    Assessment and Plan: 83 y.o. male with lumbosacral pain with worsening bilateral lumbar radicular symptoms.  This is concerning for at worst cauda equina syndrome and or lumbar radicular pain.  Plan for an MRI in the near future to evaluate the cause of the radicular pain.  This should be done on Saturday (3 days now) and patient should follow-up with me on Monday.  In the interim we will start treatment for lumbar  radicular pain due to bulging disc with prednisone Dosepak.  Norco prescribed for temporary pain control.   We discussed precautions.    Orders Placed This Encounter  Procedures  . MR Lumbar Spine Wo Contrast    Standing Status:   Future    Standing Expiration Date:   06/09/2018    Order Specific Question:   What is the patient's sedation requirement?    Answer:   No Sedation    Order Specific Question:   Does the patient have a pacemaker or implanted devices?    Answer:   No    Order Specific Question:   Preferred imaging location?    Answer:   [facility] (table limit 350lbs)    Order Specific Question:   Radiology Contrast Protocol - do NOT remove file path    Answer:   \\[email or web address]   Meds ordered this encounter  Medications  . predniSONE (STERAPRED UNI-PAK 48 TAB) 5 MG (48) TBPK tablet    Sig: 12 day dosepack po    Dispense:  48 tablet    Refill:  0  . HYDROcodone-acetaminophen (NORCO/VICODIN) 5-325 MG tablet    Sig: Take 1 tablet by mouth every 6 (six)  hours as needed.    Dispense:  15 tablet    Refill:  0    Discussed warning signs or symptoms. Please see discharge instructions. Patient expresses understanding.

## 2017-04-09 NOTE — Patient Instructions (Signed)
Thank you for coming in today. Continue current treatment.  Take prednisone.  Use norco sparingly.  You should hear about MRI today or tomorrow for Saturday.  Follow up with me Monday.   If you worsen let us know and we will order an emergency MRI.   Come back or go to the emergency room if you notice new weakness new numbness problems walking or bowel or bladder problems.

## 2017-04-10 ENCOUNTER — Ambulatory Visit (HOSPITAL_COMMUNITY)
Admission: RE | Admit: 2017-04-10 | Discharge: 2017-04-10 | Disposition: A | Discharge status: Home / Self Care | Payer: Medicare Other | Source: Ambulatory Visit | Attending: Nephrology | Admitting: Nephrology

## 2017-04-10 ENCOUNTER — Telehealth: Payer: Self-pay | Admitting: Family Medicine

## 2017-04-10 DIAGNOSIS — D631 Anemia in chronic kidney disease: Secondary | ICD-10-CM | POA: Insufficient documentation

## 2017-04-10 DIAGNOSIS — N184 Chronic kidney disease, stage 4 (severe): Secondary | ICD-10-CM | POA: Insufficient documentation

## 2017-04-10 LAB — IRON AND TIBC
Iron: 56 ug/dL (ref 45–182)
SATURATION RATIOS: 19 % (ref 17.9–39.5)
TIBC: 288 ug/dL (ref 250–450)
UIBC: 232 ug/dL

## 2017-04-10 LAB — FERRITIN: Ferritin: 37 ng/mL (ref 24–336)

## 2017-04-10 MED ORDER — DARBEPOETIN ALFA 100 MCG/0.5ML IJ SOSY
100.0000 ug | PREFILLED_SYRINGE | INTRAMUSCULAR | Status: DC
Start: 2017-04-10 — End: 2017-04-11
  Administered 2017-04-10: 100 ug via SUBCUTANEOUS

## 2017-04-10 MED ORDER — DARBEPOETIN ALFA 100 MCG/0.5ML IJ SOSY
PREFILLED_SYRINGE | INTRAMUSCULAR | Status: AC
  Administered 2017-04-10: 11:00:00 100 ug via SUBCUTANEOUS
  Filled 2017-04-10: qty 0.5

## 2017-04-10 NOTE — Telephone Encounter (Signed)
Patient called returning your call regarding Mri- Thanks

## 2017-04-10 NOTE — Telephone Encounter (Signed)
Wife has been advised to contact [facility] to schedule.

## 2017-04-10 NOTE — Progress Notes (Signed)
MRI approved.

## 2017-04-10 NOTE — Telephone Encounter (Signed)
Left VM for Pt that he needs to schedule his MRI. [facility] has also left Pt 2 messages to schedule.

## 2017-04-11 ENCOUNTER — Ambulatory Visit (HOSPITAL_BASED_OUTPATIENT_CLINIC_OR_DEPARTMENT_OTHER)
Admission: RE | Admit: 2017-04-11 | Discharge: 2017-04-11 | Disposition: A | Discharge status: Home / Self Care | Payer: Medicare Other | Source: Ambulatory Visit | Attending: Family Medicine | Admitting: Family Medicine

## 2017-04-11 DIAGNOSIS — M48061 Spinal stenosis, lumbar region without neurogenic claudication: Secondary | ICD-10-CM | POA: Insufficient documentation

## 2017-04-11 DIAGNOSIS — M5136 Other intervertebral disc degeneration, lumbar region: Secondary | ICD-10-CM | POA: Diagnosis not present

## 2017-04-11 DIAGNOSIS — M5442 Lumbago with sciatica, left side: Secondary | ICD-10-CM | POA: Insufficient documentation

## 2017-04-11 DIAGNOSIS — M5441 Lumbago with sciatica, right side: Secondary | ICD-10-CM | POA: Insufficient documentation

## 2017-04-13 ENCOUNTER — Ambulatory Visit (INDEPENDENT_AMBULATORY_CARE_PROVIDER_SITE_OTHER): Payer: Medicare Other | Admitting: Family Medicine

## 2017-04-13 ENCOUNTER — Encounter: Payer: Self-pay | Admitting: Family Medicine

## 2017-04-13 VITALS — BP 131/64 | HR 55

## 2017-04-13 DIAGNOSIS — M5442 Lumbago with sciatica, left side: Secondary | ICD-10-CM

## 2017-04-13 DIAGNOSIS — M5416 Radiculopathy, lumbar region: Secondary | ICD-10-CM

## 2017-04-13 DIAGNOSIS — G8929 Other chronic pain: Secondary | ICD-10-CM | POA: Diagnosis not present

## 2017-04-13 LAB — POCT HEMOGLOBIN-HEMACUE: HEMOGLOBIN: 10.7 g/dL — AB (ref 13.0–17.0)

## 2017-04-13 NOTE — Patient Instructions (Addendum)
Thank you for coming in today. Continue home health PT.  You should hear from [facility] about epidural and facet injection.  Recheck in 4 weeks.  Return sooner if worsening or needed.   Continue limited [place].  STOP prednisone.

## 2017-04-14 ENCOUNTER — Ambulatory Visit: Payer: Medicare Other | Admitting: Family Medicine

## 2017-04-14 NOTE — Progress Notes (Signed)
Stanley Meyers is a 83 y.o. male who presents to [place] today for back pain.  Has been seen by me several times over the past month for back pain radiating to the bilateral lower legs.  The pain is been worsening recently despite physical therapy a course of prednisone and time.  He had a CT scan and subsequently an MRI of his low back or pelvis that shows significant degenerative disc disease facet arthritis as well as neural compression especially of the left L5 S1 nerve roots.  He notes that Norco does help his pain temporarily.  Denies any new bowel or bladder dysfunction or new severe leg weakness.  He thinks physical therapy is helpful.   Past Medical History:  Diagnosis Date  . Dementia   . Diverticulosis   . GERD (gastroesophageal reflux disease)   . Hyperlipemia   . Hypertension   . Kidney failure   . Raynaud's disease   . Spasmatic colon    Past Surgical History:  Procedure Laterality Date  . AV FISTULA PLACEMENT    . CHOLECYSTECTOMY    . HERNIA REPAIR     Social History  Substance Use Topics  . Smoking status: Former Research scientist (life sciences)  . Smokeless tobacco: Never Used  . Alcohol use 1.2 oz/week    2 Glasses of wine per week     ROS:  As above   Medications: Current Outpatient Prescriptions  Medication Sig Dispense Refill  . acetaminophen (TYLENOL) 325 MG tablet Take 2 tablets (650 mg total) by mouth every 6 (six) hours as needed. 30 tablet 0  . AMBULATORY NON FORMULARY MEDICATION Manual wheelchair. 1 each 0  . AMBULATORY NON FORMULARY MEDICATION Medication Name: Youth worker wheelchair. Dx. Lower extremity weakness and recurrent falls. 1 Units 0  . Calcium-Phosphorus-Vitamin D (CITRACAL +D3 PO) Take by mouth.    . Diclofenac Sodium (PENNSAID) 2 % SOLN APPLY TO AFFECTED AREA TWICE A DAY 112 g 11  . donepezil (ARICEPT) 23 MG TABS tablet TAKE 1 TABLET (23 MG TOTAL) BY MOUTH AT BEDTIME. 30 tablet 1  . famotidine  (PEPCID) 40 MG tablet TAKE 1 TABLET BY MOUTH TWICE A DAY 180 tablet 0  . furosemide (LASIX) 20 MG tablet Take 20 mg by mouth as needed for edema.    Marland Kitchen HYDROcodone-acetaminophen (NORCO/VICODIN) 5-325 MG tablet Take 1 tablet by mouth every 6 (six) hours as needed. 15 tablet 0  . memantine (NAMENDA) 10 MG tablet TAKE 1 TABLET (10 MG TOTAL) BY MOUTH 2 (TWO) TIMES DAILY. 60 tablet 6  . Pediatric Multivitamins-Iron (CHILD CHEWABLE VITAMINS/IRON PO) Take by mouth.    . predniSONE (STERAPRED UNI-PAK 48 TAB) 5 MG (48) TBPK tablet 12 day dosepack po 48 tablet 0  . Probiotic Product (PROBIOTIC PO) Take by mouth.    . pyridOXINE (VITAMIN B-6) 100 MG tablet Take 100 mg by mouth daily.     No current facility-administered medications for this visit.    Allergies  Allergen Reactions  . Gabapentin Swelling  . Linzess [Linaclotide] Nausea And Vomiting    bloating  . Tramadol Other (See Comments)    dizzy     Exam:  BP 131/64   Pulse (!) 55  General: Well Developed, well nourished, and in no acute distress.  Neuro/Psych: Alert and oriented x3, extra-ocular muscles intact, able to move all 4 extremities, sensation grossly intact. Skin: Warm and dry, no rashes noted.  Respiratory: Not using accessory muscles, speaking in full sentences, trachea  midline.  Cardiovascular: Pulses palpable, no extremity edema. Abdomen: Does not appear distended. MSK:  L-spine nontender to midline.  Lower extremity strength is diminished but equal bilaterally and intact.  Patient is able to stand without assistance. Reflexes are diminished but equal bilaterally.  Sensation is intact throughout.    No results found for this or any previous visit (from the past 48 hour(s)). Mr Lumbar Spine Wo Contrast  Result Date: 04/11/2017 CLINICAL DATA:  Back pain, cauda equina syndrome suspected. EXAM: MRI LUMBAR SPINE WITHOUT CONTRAST TECHNIQUE: Multiplanar, multisequence MR imaging of the lumbar spine was performed. No  intravenous contrast was administered. COMPARISON:  10/22/2013 FINDINGS: Segmentation:  Standard. Alignment:  Advanced dextroscoliosis. Vertebrae: No fracture, evidence of discitis, or bone lesion. Multilevel endplate degenerative edema without progression from prior. Sizable T12 hemangioma. Conus medullaris: Extends to the T12 level and appears normal. Paraspinal and other soft tissues: Atrophy of intrinsic back muscles. Bilateral renal cystic intensities. Disc levels: T12- L1: Advanced degenerative disc narrowing with endplate spurring. Facet hypertrophy. Left subarticular recess stenosis with L1 impingement. Moderate right and advanced left foraminal narrowing. Spinal stenosis is overall mild. L1-L2: Advanced asymmetric leftward disc narrowing with far-lateral endplate spurring. Asymmetric left facet hypertrophy. Left subarticular recess stenosis with L2 impingement. Severe left foraminal stenosis with L1 impingement. L2-L3: Advanced degenerative disc narrowing. Facet spurring asymmetric towards the left. Left subarticular recess narrowing that could affect the L3 nerve root. Patent foramina. L3-L4: Advanced degenerative disc disease with narrowing and disc bulging. Ligamentum flavum thickening asymmetric to the left. Severe spinal stenosis with no visible CSF. Moderate bilateral foraminal stenosis. L4-L5: Advanced degenerative disc disease with asymmetric rightward disc collapse and far-lateral spurring. Ligamentum flavum thickening and facet spurring. Moderate spinal stenosis. Severe right foraminal impingement. L5-S1:Advanced degenerative disc narrowing with disc collapse and endplate ridging. Facet spurring greater on the right. Severe right foraminal impingement. Mild spinal stenosis. Mild left foraminal narrowing. IMPRESSION: 1. Advanced and diffuse disc and facet degeneration with dextroscoliosis. No notable progression when compared to 2015. 2. T12-L1 left T12 and L1 impingement in the subarticular recess  and foramen. 3. L1-2 left L1 and L2 impingement at the foramen and subarticular recess. 4. L2-3 left subarticular recess stenosis which could affect the L3 nerve root. 5. L3-4 severe spinal stenosis. Moderate bilateral foraminal narrowing. 6. L4-5 severe right foraminal impingement. 7. L5-S1 severe right foraminal impingement. Electronically Signed   By: Monte Fantasia M.D.   On: 04/11/2017 19:47      Assessment and Plan: 83 y.o. male with  Significant back pain and radicular pain. Patient has significant degeneration and neuro impingement seen on MRI.   We had a long discussion about options.  I do not think Mr. Catalfamo would be a good surgical candidate.  Plan for trial of epidural steroid injection facet injections.  Continued limited Norco sparingly and recheck in about a month.  Discussed red flag signs or symptoms.    Orders Placed This Encounter  Procedures  . DG Epidurography    Order Specific Question:   Reason for Exam (SYMPTOM  OR DIAGNOSIS REQUIRED)    Answer:   L5 S1 left. Can do at same time as facet    Order Specific Question:   Preferred imaging location?    Answer:   [facility]  . DG FACET JT INJ L /S SINGLE LEVEL LEFT W/FL/CT    Order Specific Question:   Reason for exam:    Answer:   L5 and s1 at same time as  epidural if able    Order Specific Question:   Preferred imaging location?    Answer:   [facility]  . DG FACET JT INJ L /S SINGLE LEVEL RIGHT W/FL/CT    Order Specific Question:   Reason for exam:    Answer:   L5 S1 at same time as epidural if able    Order Specific Question:   Preferred imaging location?    Answer:   [facility]   No orders of the defined types were placed in this encounter.   Discussed warning signs or symptoms. Please see discharge instructions. Patient expresses understanding.

## 2017-04-22 ENCOUNTER — Ambulatory Visit
Admission: RE | Admit: 2017-04-22 | Discharge: 2017-04-22 | Disposition: A | Discharge status: Home / Self Care | Payer: Medicare Other | Source: Ambulatory Visit | Attending: Family Medicine | Admitting: Family Medicine

## 2017-04-22 MED ORDER — IOPAMIDOL (ISOVUE-M 200) INJECTION 41%
1.0000 mL | Freq: Once | INTRAMUSCULAR | Status: AC
  Administered 2017-04-22: 1 mL via INTRA_ARTICULAR

## 2017-04-22 MED ORDER — METHYLPREDNISOLONE ACETATE 40 MG/ML INJ SUSP (RADIOLOG
120.0000 mg | Freq: Once | INTRAMUSCULAR | Status: AC
  Administered 2017-04-22: 120 mg via INTRA_ARTICULAR

## 2017-04-22 NOTE — Discharge Instructions (Signed)

## 2017-04-28 ENCOUNTER — Other Ambulatory Visit: Payer: Self-pay | Admitting: Family Medicine

## 2017-05-05 ENCOUNTER — Encounter: Payer: Self-pay | Admitting: Family Medicine

## 2017-05-05 ENCOUNTER — Ambulatory Visit: Payer: Medicare Other | Admitting: Family Medicine

## 2017-05-05 VITALS — BP 151/99 | HR 60 | Wt 123.0 lb

## 2017-05-05 DIAGNOSIS — M5136 Other intervertebral disc degeneration, lumbar region: Secondary | ICD-10-CM | POA: Diagnosis not present

## 2017-05-05 DIAGNOSIS — M5416 Radiculopathy, lumbar region: Secondary | ICD-10-CM | POA: Diagnosis not present

## 2017-05-05 NOTE — Patient Instructions (Addendum)
Thank you for coming in today. You should hear about injections and ablations with [facility].  Recheck with me in 1 month.  Use pain medicine sparingly.  Pad the lateral foot and provide wider shoes if able.   I would pick epidural before ablation but I am hopeful that they may be able to do both.

## 2017-05-05 NOTE — Progress Notes (Signed)
Stanley Meyers is a 83 y.o. male who presents to [place] today for follow-up back pain.  Stanley Meyers was seen several times recently for chronic back pain radiating to both legs. He most recently had facet injections at L5-S1 which provided temporary pain relief. He has he continues to express pain radiating down his legs but denies significant sore serious new weakness. No bowel bladder dysfunction. The pain is chronic and interferes with his ability to sleep and rest normally.   Past Medical History:  Diagnosis Date  . Dementia   . Diverticulosis   . GERD (gastroesophageal reflux disease)   . Hyperlipemia   . Hypertension   . Kidney failure   . Raynaud's disease   . Spasmatic colon    Past Surgical History:  Procedure Laterality Date  . AV FISTULA PLACEMENT    . CHOLECYSTECTOMY    . HERNIA REPAIR     Social History   Tobacco Use  . Smoking status: Former Research scientist (life sciences)  . Smokeless tobacco: Never Used  Substance Use Topics  . Alcohol use: Yes    Alcohol/week: 1.2 oz    Types: 2 Glasses of wine per week     ROS:  As above   Medications: Current Outpatient Medications  Medication Sig Dispense Refill  . acetaminophen (TYLENOL) 325 MG tablet Take 2 tablets (650 mg total) by mouth every 6 (six) hours as needed. 30 tablet 0  . AMBULATORY NON FORMULARY MEDICATION Manual wheelchair. 1 each 0  . AMBULATORY NON FORMULARY MEDICATION Medication Name: Youth worker wheelchair. Dx. Lower extremity weakness and recurrent falls. 1 Units 0  . Calcium-Phosphorus-Vitamin D (CITRACAL +D3 PO) Take by mouth.    . Diclofenac Sodium (PENNSAID) 2 % SOLN APPLY TO AFFECTED AREA TWICE A DAY 112 g 11  . donepezil (ARICEPT) 23 MG TABS tablet TAKE 1 TABLET (23 MG TOTAL) BY MOUTH AT BEDTIME. 30 tablet 1  . famotidine (PEPCID) 40 MG tablet TAKE 1 TABLET BY MOUTH TWICE A DAY 180 tablet 0  . furosemide (LASIX) 20 MG tablet Take 20 mg by mouth as needed  for edema.    Marland Kitchen HYDROcodone-acetaminophen (NORCO/VICODIN) 5-325 MG tablet Take 1 tablet by mouth every 6 (six) hours as needed. 15 tablet 0  . memantine (NAMENDA) 10 MG tablet TAKE 1 TABLET (10 MG TOTAL) BY MOUTH 2 (TWO) TIMES DAILY. 60 tablet 6  . Pediatric Multivitamins-Iron (CHILD CHEWABLE VITAMINS/IRON PO) Take by mouth.    . Probiotic Product (PROBIOTIC PO) Take by mouth.    . pyridOXINE (VITAMIN B-6) 100 MG tablet Take 100 mg by mouth daily.     No current facility-administered medications for this visit.    Allergies  Allergen Reactions  . Gabapentin Swelling  . Linzess [Linaclotide] Nausea And Vomiting    bloating  . Tramadol Other (See Comments)    dizzy     Exam:  BP (!) 151/99   Pulse 60   Wt 123 lb (55.8 kg)   BMI 20.47 kg/m   General: Well Developed, well nourished, and in no acute distress.  Neuro/Psych: Alert and oriented x3, extra-ocular muscles intact, able to move all 4 extremities, sensation grossly intact. Skin: Warm and dry, no rashes noted.  Respiratory: Not using accessory muscles, speaking in full sentences, trachea midline.  Cardiovascular: Pulses palpable, no extremity edema. Abdomen: Does not appear distended. MSK: Lspine: Tender to palpation along lumbar paraspinal muscles. Sensation is intact bilateral extremities.    No results found for this  or any previous visit (from the past 48 hour(s)). No results found.    Assessment and Plan: 83 y.o. male with  Lumbar degenerative disc disease and lumbar radiculopathy.  Chronic back pain related to a DDD and facet DJD. Patient had temporary but reasonable pain relief with facet injections. We'll proceed with ablation.  Patient also experiences lumbar radiculopathy especially at the S1 nerve root. Plan for epidural steroid injection.  Recheck following interventional procedures.    Orders Placed This Encounter  Procedures  . DG Facet Jt Neuro Destruct Sing L/S w/Img Guide    Order Specific  Question:   Reason for exam:    Answer:   Need confirmatory medial branch block, and if responsive, radiofrequency ablation of: BL L5/S1    Order Specific Question:   Preferred imaging location?    Answer:   [facility]  . DG Epidurography    Order Specific Question:   Reason for Exam (SYMPTOM  OR DIAGNOSIS REQUIRED)    Answer:   BL S1 nerve roots    Order Specific Question:   Preferred imaging location?    Answer:   [facility]   No orders of the defined types were placed in this encounter.   Discussed warning signs or symptoms. Please see discharge instructions. Patient expresses understanding.  I spent 25 minutes with this patient, greater than 50% was face-to-face time counseling regarding treatment plan.

## 2017-05-06 NOTE — Progress Notes (Signed)
Stanley Meyers with [facility] notified.

## 2017-05-08 ENCOUNTER — Ambulatory Visit (HOSPITAL_COMMUNITY)
Admission: RE | Admit: 2017-05-08 | Discharge: 2017-05-08 | Disposition: A | Discharge status: Home / Self Care | Payer: Medicare Other | Source: Ambulatory Visit | Attending: Nephrology | Admitting: Nephrology

## 2017-05-08 VITALS — BP 137/48 | HR 52 | Temp 97.7°F | Resp 20

## 2017-05-08 DIAGNOSIS — D631 Anemia in chronic kidney disease: Secondary | ICD-10-CM | POA: Insufficient documentation

## 2017-05-08 DIAGNOSIS — N184 Chronic kidney disease, stage 4 (severe): Secondary | ICD-10-CM | POA: Insufficient documentation

## 2017-05-08 LAB — IRON AND TIBC
IRON: 17 ug/dL — AB (ref 45–182)
Saturation Ratios: 7 % — ABNORMAL LOW (ref 17.9–39.5)
TIBC: 246 ug/dL — AB (ref 250–450)
UIBC: 229 ug/dL

## 2017-05-08 LAB — FERRITIN: Ferritin: 85 ng/mL (ref 24–336)

## 2017-05-08 MED ORDER — DARBEPOETIN ALFA 100 MCG/0.5ML IJ SOSY
PREFILLED_SYRINGE | INTRAMUSCULAR | Status: AC
  Administered 2017-05-08: 100 ug via SUBCUTANEOUS
  Filled 2017-05-08: qty 0.5

## 2017-05-08 MED ORDER — DARBEPOETIN ALFA 100 MCG/0.5ML IJ SOSY
100.0000 ug | PREFILLED_SYRINGE | INTRAMUSCULAR | Status: DC
  Administered 2017-05-08: 100 ug via SUBCUTANEOUS

## 2017-05-11 ENCOUNTER — Telehealth: Payer: Self-pay

## 2017-05-11 LAB — POCT HEMOGLOBIN-HEMACUE: Hemoglobin: 10.9 g/dL — ABNORMAL LOW (ref 13.0–17.0)

## 2017-05-11 NOTE — Telephone Encounter (Signed)
Patient wife called stated that patient is having severe back pain. She stated that patient has not been seen by [facility] yet. She wants to know if it is safe for patient to take Meloxicam for the pain and if it is she would like some sent to pharmacy. Please advise. Rhonda Cunningham,CMA

## 2017-05-11 NOTE — Telephone Encounter (Signed)
Meloxicam is not a great idea due to kidney function.  Hydrocodone is probably safer.

## 2017-05-12 ENCOUNTER — Ambulatory Visit: Payer: Medicare Other | Admitting: Family Medicine

## 2017-05-12 NOTE — Telephone Encounter (Signed)
Spoke to patient wife gave her results as noted below. Rhonda Cunningham,CMA

## 2017-05-14 ENCOUNTER — Telehealth: Payer: Self-pay | Admitting: Family Medicine

## 2017-05-14 DIAGNOSIS — G8929 Other chronic pain: Secondary | ICD-10-CM

## 2017-05-14 DIAGNOSIS — M545 Low back pain, unspecified: Secondary | ICD-10-CM

## 2017-05-14 NOTE — Telephone Encounter (Signed)
Spoke with radiology about back injections

## 2017-05-15 ENCOUNTER — Other Ambulatory Visit: Payer: Self-pay | Admitting: Family Medicine

## 2017-05-15 ENCOUNTER — Ambulatory Visit
Admission: RE | Admit: 2017-05-15 | Discharge: 2017-05-15 | Disposition: A | Discharge status: Home / Self Care | Payer: Medicare Other | Source: Ambulatory Visit | Attending: Family Medicine | Admitting: Family Medicine

## 2017-05-15 DIAGNOSIS — M5416 Radiculopathy, lumbar region: Secondary | ICD-10-CM

## 2017-05-15 MED ORDER — METHYLPREDNISOLONE ACETATE 40 MG/ML INJ SUSP (RADIOLOG
120.0000 mg | Freq: Once | INTRAMUSCULAR | Status: AC
  Administered 2017-05-15: 120 mg via EPIDURAL

## 2017-05-15 MED ORDER — IOPAMIDOL (ISOVUE-M 200) INJECTION 41%
1.0000 mL | Freq: Once | INTRAMUSCULAR | Status: AC
  Administered 2017-05-15: 1 mL via EPIDURAL

## 2017-05-15 NOTE — Discharge Instructions (Signed)

## 2017-06-01 ENCOUNTER — Encounter: Payer: Self-pay | Admitting: Family Medicine

## 2017-06-01 ENCOUNTER — Ambulatory Visit: Payer: Medicare Other | Admitting: Family Medicine

## 2017-06-01 VITALS — BP 139/56 | HR 82 | Wt 116.0 lb

## 2017-06-01 DIAGNOSIS — R634 Abnormal weight loss: Secondary | ICD-10-CM

## 2017-06-01 DIAGNOSIS — K1379 Other lesions of oral mucosa: Secondary | ICD-10-CM | POA: Diagnosis not present

## 2017-06-01 DIAGNOSIS — N184 Chronic kidney disease, stage 4 (severe): Secondary | ICD-10-CM

## 2017-06-01 DIAGNOSIS — J3489 Other specified disorders of nose and nasal sinuses: Secondary | ICD-10-CM | POA: Diagnosis not present

## 2017-06-01 DIAGNOSIS — J449 Chronic obstructive pulmonary disease, unspecified: Secondary | ICD-10-CM | POA: Diagnosis not present

## 2017-06-01 DIAGNOSIS — F0150 Vascular dementia without behavioral disturbance: Secondary | ICD-10-CM | POA: Diagnosis not present

## 2017-06-01 DIAGNOSIS — I10 Essential (primary) hypertension: Secondary | ICD-10-CM | POA: Diagnosis not present

## 2017-06-01 DIAGNOSIS — I679 Cerebrovascular disease, unspecified: Secondary | ICD-10-CM | POA: Diagnosis not present

## 2017-06-01 DIAGNOSIS — L97512 Non-pressure chronic ulcer of other part of right foot with fat layer exposed: Secondary | ICD-10-CM | POA: Diagnosis not present

## 2017-06-01 DIAGNOSIS — I6789 Other cerebrovascular disease: Secondary | ICD-10-CM

## 2017-06-01 MED ORDER — IPRATROPIUM BROMIDE 0.03 % NA SOLN: 2.0000 | Freq: Two times a day (BID) | NASAL | 12 refills | Status: AC

## 2017-06-01 NOTE — Patient Instructions (Addendum)
Try OTC Lysine.  Usually in the vitamin or supplement section for oral ulcers.

## 2017-06-01 NOTE — Progress Notes (Signed)
Subjective:    CC: Dementia   HPI:  4 month follow-up for dementia - currently on high dose Aricept and tolerating well.  According to his wife his dementia seems to be progressing.  He is having a harder time recognizing people intermittently including family and then gets very scared and anxious.  In fact his daughter who he is closest to, he asked her one day to show her birth certificate to prove that she was related to him.  Is also lost a little bit of weight.  She feels like he still eating fairly regularly.  But she is at the point where she thinks she may need to restart some boost or Ensure.  Of course watching the potassium levels because of his renal function.  He follows regularly with Dr. Lorrene Reid him for his chronic kidney disease -they are planning on hopefully taking out/removing his fistula.  He has met with vascular surgery is causing his Raynaud's to worsen significantly in his right hand and so they want to reverse it.  He function has been stable so they are hoping that he will not need dialysis.  Hypertension- Pt denies chest pain, SOB, dizziness, or heart palpitations.  Taking meds as directed w/o problems.  Denies medication side effects.    F/U COPD - Stable. No flares recently.    His wife is also concerned because he is getting a lot of secretions at night.  If continue to keep the head of the bed elevated to help but he will wake up choking and gagging.  No cough during the daytime.  Past medical history, Surgical history, Family history not pertinant except as noted below, Social history, Allergies, and medications have been entered into the medical record, reviewed, and corrections made.     Objective:    General: Well Developed, well nourished, and in no acute distress.  Neuro: Alert and oriented x3, extra-ocular muscles intact, sensation grossly intact.  HEENT: Normocephalic, atraumatic  Skin: Warm and dry, no rashes. Cardiac: Regular rate and rhythm, no  murmurs rubs or gallops, no lower extremity edema.  Respiratory: Clear to auscultation bilaterally. Not using accessory muscles, speaking in full sentences. Skin: Has an approximately 1 cm ulceration on the second toe on his right foot at the distal joint.  Wound itself looks okay with no significant erythema.  The toe appears dusky but so do his other toes. No foul odor coming from the wound.he has a deformity of the mid lateral right foot.  There is also a small ulceration there as well.   Impression and Recommendations:    Dementia -continues to worsen.  Urgently his dementia is worsening.  He is already on Aricept and Namenda.  Has good family support.  His wife is just more concerned that he seems to be fearful at times.  Post nasal drip-recommend a trial of Atrovent to see if this helps with some of the secretions particularly at night.  Abnormal weight loss-probably part of his advancing dementia but he is going to start trying some boost or Ensure.  HTN -controlled.  CKD - Following with Dr. Lorrene Reid.   COPD -Stable.  Toe ulceration of right foot-placed Xeroform with some nonstick dressing and a little bit of Covan to hold the gauze in place.  Recommend wearing shoes that take away any pressure off of the toe or outside of the foot.    He also has some deformity on the right lateral portion of the foot related to arthritis and probably  degeneration of the midfoot.  Again trying to find shoe wear that is not putting extra pressure on the area.  Recurrent mouth oral ulcers-can try over-the-counter lysine.  Time spent 40 min greater than 50% time spent counseling about dementia, postnasal drip, weight loss, blood pressure, toe ulceration, foot deformity, hypertension.

## 2017-06-02 LAB — COMPLETE METABOLIC PANEL WITH GFR
AG Ratio: 1.6 (calc) (ref 1.0–2.5)
ALBUMIN MSPROF: 3.6 g/dL (ref 3.6–5.1)
ALKALINE PHOSPHATASE (APISO): 79 U/L (ref 40–115)
ALT: 11 U/L (ref 9–46)
AST: 16 U/L (ref 10–35)
BILIRUBIN TOTAL: 0.7 mg/dL (ref 0.2–1.2)
BUN / CREAT RATIO: 19 (calc) (ref 6–22)
BUN: 37 mg/dL — AB (ref 7–25)
CHLORIDE: 103 mmol/L (ref 98–110)
CO2: 25 mmol/L (ref 20–32)
CREATININE: 2 mg/dL — AB (ref 0.70–1.11)
Calcium: 10 mg/dL (ref 8.6–10.3)
GFR, Est African American: 33 mL/min/{1.73_m2} — ABNORMAL LOW (ref >=60)
GFR, Est Non African American: 29 mL/min/{1.73_m2} — ABNORMAL LOW (ref >=60)
Globulin: 2.2 g/dL (calc) (ref 1.9–3.7)
Glucose, Bld: 98 mg/dL (ref 65–99)
Potassium: 4.2 mmol/L (ref 3.5–5.3)
Sodium: 137 mmol/L (ref 135–146)
Total Protein: 5.8 g/dL — ABNORMAL LOW (ref 6.1–8.1)

## 2017-06-02 LAB — TSH: TSH: 1.6 mIU/L (ref 0.40–4.50)

## 2017-06-04 ENCOUNTER — Ambulatory Visit: Payer: Medicare Other | Admitting: Family Medicine

## 2017-06-04 ENCOUNTER — Encounter: Payer: Self-pay | Admitting: Family Medicine

## 2017-06-04 ENCOUNTER — Other Ambulatory Visit (HOSPITAL_COMMUNITY): Payer: Self-pay | Admitting: *Deleted

## 2017-06-04 VITALS — BP 138/63 | HR 66

## 2017-06-04 DIAGNOSIS — G8929 Other chronic pain: Secondary | ICD-10-CM

## 2017-06-04 DIAGNOSIS — M545 Low back pain: Secondary | ICD-10-CM

## 2017-06-04 NOTE — Progress Notes (Signed)
Stanley Meyers is a 83 y.o. male who presents to [facility] today for back pain.  Stanley Meyers continues to experience severe low back pain.  He has had a pretty extensive workup recently including CT and MRI of his back showing diffuse degenerative changes and neurocompression.  He had a trial of some interventions including epidural steroid injection and facet injections which have not helped.  His wife says that he is worsening losing weight and having trouble sleeping.  This is exacerbating his underlying dementia.  He will occasionally use the hydrocodone which does tend to cause delirium however.  Both his wife and himself understand that he probably is not going to get much better.   Past Medical History:  Diagnosis Date  . Dementia   . Diverticulosis   . GERD (gastroesophageal reflux disease)   . Hyperlipemia   . Hypertension   . Kidney failure   . Raynaud's disease   . Spasmatic colon    Past Surgical History:  Procedure Laterality Date  . AV FISTULA PLACEMENT    . CHOLECYSTECTOMY    . HERNIA REPAIR     Social History   Tobacco Use  . Smoking status: Former Research scientist (life sciences)  . Smokeless tobacco: Never Used  Substance Use Topics  . Alcohol use: Yes    Alcohol/week: 1.2 oz    Types: 2 Glasses of wine per week     ROS:  As above   Medications: Current Outpatient Medications  Medication Sig Dispense Refill  . acetaminophen (TYLENOL) 325 MG tablet Take 2 tablets (650 mg total) by mouth every 6 (six) hours as needed. 30 tablet 0  . AMBULATORY NON FORMULARY MEDICATION Medication Name: Youth worker wheelchair. Dx. Lower extremity weakness and recurrent falls. 1 Units 0  . Calcium-Phosphorus-Vitamin D (CITRACAL +D3 PO) Take by mouth.    . Diclofenac Sodium (PENNSAID) 2 % SOLN APPLY TO AFFECTED AREA TWICE A DAY 112 g 11  . donepezil (ARICEPT) 23 MG TABS tablet TAKE 1 TABLET (23 MG TOTAL) BY MOUTH AT BEDTIME. 30 tablet 1  .  famotidine (PEPCID) 40 MG tablet TAKE 1 TABLET BY MOUTH TWICE A DAY 180 tablet 0  . furosemide (LASIX) 20 MG tablet Take 20 mg by mouth as needed for edema.    Marland Kitchen ipratropium (ATROVENT) 0.03 % nasal spray Place 2 sprays into both nostrils every 12 (twelve) hours. 30 mL 12  . memantine (NAMENDA) 10 MG tablet TAKE 1 TABLET (10 MG TOTAL) BY MOUTH 2 (TWO) TIMES DAILY. 60 tablet 6  . Pediatric Multivitamins-Iron (CHILD CHEWABLE VITAMINS/IRON PO) Take by mouth.    . Probiotic Product (PROBIOTIC PO) Take by mouth.    . pyridOXINE (VITAMIN B-6) 100 MG tablet Take 100 mg by mouth daily.     No current facility-administered medications for this visit.    Allergies  Allergen Reactions  . Gabapentin Swelling  . Linzess [Linaclotide] Nausea And Vomiting and Other (See Comments)    bloating  . Tramadol Other (See Comments)    dizzy     Exam:  BP 138/63   Pulse 66  General: Well Developed, well nourished, and in no acute distress.  Seated in wheelchair Neuro/Psych: Alert and oriented x3, extra-ocular muscles intact, able to move all 4 extremities, sensation grossly intact. Skin: Warm and dry, no rashes noted.  Respiratory: Not using accessory muscles, speaking in full sentences, trachea midline.  Cardiovascular: Pulses palpable, no extremity edema. Abdomen: Does not appear distended. MSK: L  spine: Tender pain to walk.  Lower extremity motion is intact     No results found for this or any previous visit (from the past 48 hour(s)). No results found.    Assessment and Plan: 83 y.o. male with end-stage severe back pain complicating overall poor health picture and dementia.  Unfortunately there is not much I can do at this point.  I think occasional opiate-based pain medicines is reasonable with the understanding that it may cause delirium and increase his risk of falls.  At this point I think he is a great candidate for palliative consult for potential hospice intervention.  I mentioned this to  his wife who reluctantly understands.  I recommend he follow-up with his primary care provider to discuss potential palliative or hospice sooner.  I will continue to be available for acute sports medicine orthopedic needs or for follow-ups as needed.  Otherwise follow-up with PCP.    No orders of the defined types were placed in this encounter.  No orders of the defined types were placed in this encounter.   Discussed warning signs or symptoms. Please see discharge instructions. Patient expresses understanding.  I spent 25 minutes with this patient, greater than 50% was face-to-face time counseling regarding ddx and treatment plan.

## 2017-06-04 NOTE — Patient Instructions (Signed)
Thank you for coming in today. Use pain medicine sparingly.  Consider Hospice.  Follow up with Dr Madilyn Fireman

## 2017-06-05 ENCOUNTER — Ambulatory Visit (HOSPITAL_COMMUNITY)
Admission: RE | Admit: 2017-06-05 | Discharge: 2017-06-05 | Disposition: A | Discharge status: Home / Self Care | Payer: Medicare Other | Source: Ambulatory Visit | Attending: Nephrology | Admitting: Nephrology

## 2017-06-05 VITALS — BP 140/56 | HR 63 | Temp 97.7°F | Resp 20 | Ht 65.0 in | Wt 116.0 lb

## 2017-06-05 DIAGNOSIS — N184 Chronic kidney disease, stage 4 (severe): Secondary | ICD-10-CM | POA: Diagnosis present

## 2017-06-05 DIAGNOSIS — D631 Anemia in chronic kidney disease: Secondary | ICD-10-CM | POA: Insufficient documentation

## 2017-06-05 LAB — POCT HEMOGLOBIN-HEMACUE: Hemoglobin: 11.6 g/dL — ABNORMAL LOW (ref 13.0–17.0)

## 2017-06-05 MED ORDER — DARBEPOETIN ALFA 100 MCG/0.5ML IJ SOSY
100.0000 ug | PREFILLED_SYRINGE | INTRAMUSCULAR | Status: DC
  Administered 2017-06-05: 100 ug via SUBCUTANEOUS

## 2017-06-05 MED ORDER — SODIUM CHLORIDE 0.9 % IV SOLN
510.0000 mg | Freq: Once | INTRAVENOUS | Status: AC
  Administered 2017-06-05: 510 mg via INTRAVENOUS
  Filled 2017-06-05: qty 17

## 2017-06-05 MED ORDER — DARBEPOETIN ALFA 100 MCG/0.5ML IJ SOSY
PREFILLED_SYRINGE | INTRAMUSCULAR | Status: AC
  Administered 2017-06-05: 100 ug via SUBCUTANEOUS
  Filled 2017-06-05: qty 0.5

## 2017-06-05 NOTE — Discharge Instructions (Signed)

## 2017-06-12 ENCOUNTER — Telehealth: Payer: Self-pay

## 2017-06-12 NOTE — Telephone Encounter (Signed)
[name]'s wife called and states Stanley Meyers is stating he is very depressed. He is not sleeping. She wanted to know if there is a medication to treat both the depression and insomnia. Please advise.   She is worn out due to him being up all night.

## 2017-06-17 MED ORDER — DULOXETINE HCL 30 MG PO CPEP: 30.0000 mg | ORAL_CAPSULE | Freq: Every day | ORAL | 1 refills | Status: DC

## 2017-06-17 NOTE — Telephone Encounter (Signed)
Called can his wife, Stanley Meyers and discussed that he is still in a fair amount of pain.  He is definitely having hallucinations and getting very scared and anxious because he does not recognize her.  Sometimes she is able to distract him or turn on old movies and that works and other times not.  He has not mentioned to her several times that he just feels sad and depressed.  She thinks that is just because he is unable to get out and do things that normally he finds joy from like going to church etc.  Sometimes on a good day she will take him.  The oxycodone makes him loopy.  She has been giving him Tylenol 500 mg twice a day.  We discussed options to better control his pain and see if this helps with some of his more acute agitation and delirium.  Will have her increase the Tylenol to 3 times a day.  With the option of may be been going up to 4 if needed.  We will try Cymbalta 30 mg but I do not really want to go much above this because of his renal function.  If after a week or 2 she is not noticing improvement then will consider adding a fentanyl patch.

## 2017-06-17 NOTE — Telephone Encounter (Signed)
Stanley Meyers, can you please call Stanley Meyers and let her know that I would try give her a call this afternoon and come to get more detail on what is going on. Please route back to me when done.

## 2017-06-17 NOTE — Telephone Encounter (Signed)
Spoke with Stanley Meyers, advised that PCP would be in touch with her this afternoon to discuss Mang in detail.   Wife does advise he has been having some hallucination off and on for several weeks. Questions that there are two of Stanley Meyers, and that random people are in the apartment. He also is not sleeping. Wife reports he usually gets about 4 hours of sleep a night before he starts tossing and turning. Then he usually will wake up frightened of who is in bed with him, etc.   Per Dr Georgina Snell, Pt was advised to cut his hydrocodone in half. A whole tab made him "loopy." Wife has been doing this, and it is working better. Pt also has had all his injections for back pain with no success.

## 2017-06-23 ENCOUNTER — Other Ambulatory Visit: Payer: Self-pay | Admitting: *Deleted

## 2017-06-23 MED ORDER — MEMANTINE HCL 10 MG PO TABS: ORAL_TABLET | ORAL | 1 refills | Status: AC

## 2017-06-23 MED ORDER — DONEPEZIL HCL 23 MG PO TABS: 23.0000 mg | ORAL_TABLET | Freq: Every day | ORAL | 1 refills | Status: AC

## 2017-07-03 ENCOUNTER — Ambulatory Visit (HOSPITAL_COMMUNITY)
Admission: RE | Admit: 2017-07-03 | Discharge: 2017-07-03 | Disposition: A | Discharge status: Home / Self Care | Payer: Medicare Other | Source: Ambulatory Visit | Attending: Nephrology | Admitting: Nephrology

## 2017-07-03 VITALS — BP 132/49 | HR 64 | Temp 98.4°F | Resp 20

## 2017-07-03 DIAGNOSIS — N184 Chronic kidney disease, stage 4 (severe): Secondary | ICD-10-CM | POA: Diagnosis present

## 2017-07-03 DIAGNOSIS — D631 Anemia in chronic kidney disease: Secondary | ICD-10-CM | POA: Insufficient documentation

## 2017-07-03 LAB — IRON AND TIBC
Iron: 55 ug/dL (ref 45–182)
Saturation Ratios: 24 % (ref 17.9–39.5)
TIBC: 228 ug/dL — ABNORMAL LOW (ref 250–450)
UIBC: 173 ug/dL

## 2017-07-03 LAB — FERRITIN: FERRITIN: 295 ng/mL (ref 24–336)

## 2017-07-03 LAB — POCT HEMOGLOBIN-HEMACUE: Hemoglobin: 12.2 g/dL — ABNORMAL LOW (ref 13.0–17.0)

## 2017-07-03 MED ORDER — DARBEPOETIN ALFA 100 MCG/0.5ML IJ SOSY: 100.0000 ug | PREFILLED_SYRINGE | INTRAMUSCULAR | Status: DC

## 2017-07-10 ENCOUNTER — Ambulatory Visit: Payer: Medicare Other | Admitting: Family Medicine

## 2017-07-10 ENCOUNTER — Encounter: Payer: Self-pay | Admitting: Family Medicine

## 2017-07-10 VITALS — BP 139/62 | HR 63 | Temp 97.8°F | Wt 115.0 lb

## 2017-07-10 DIAGNOSIS — G308 Other Alzheimer's disease: Secondary | ICD-10-CM

## 2017-07-10 DIAGNOSIS — G8929 Other chronic pain: Secondary | ICD-10-CM

## 2017-07-10 DIAGNOSIS — I6789 Other cerebrovascular disease: Secondary | ICD-10-CM

## 2017-07-10 DIAGNOSIS — F0281 Dementia in other diseases classified elsewhere with behavioral disturbance: Secondary | ICD-10-CM

## 2017-07-10 DIAGNOSIS — I679 Cerebrovascular disease, unspecified: Secondary | ICD-10-CM | POA: Diagnosis not present

## 2017-07-10 MED ORDER — OXYCODONE HCL 5 MG/5ML PO SOLN: ORAL | 0 refills | Status: AC

## 2017-07-10 NOTE — Progress Notes (Addendum)
Subjective:    Patient ID: Stanley Meyers, male    DOB: 14-Mar-1925, 84 y.o.   MRN: [id number]  HPI 84 year old male with dementia is brought today by his wife to discuss continuing concerns about him having episodes of being fearful..  Note below from telephone encounter: "Called can his wife, Vira Agar and discussed that he is still in a fair amount of pain.  He is definitely having hallucinations and getting very scared and anxious because he does not recognize her.  Sometimes she is able to distract him or turn on old movies and that works and other times not.  He has not mentioned to her several times that he just feels sad and depressed.  She thinks that is just because he is unable to get out and do things that normally he finds joy from like going to church etc.  Sometimes on a good day she will take him.  The oxycodone makes him loopy.  She has been giving him Tylenol 500 mg twice a day.  We discussed options to better control his pain and see if this helps with some of his more acute agitation and delirium.  Will have her increase the Tylenol to 3 times a day.  With the option of may be been going up to 4 if needed.  We will try Cymbalta 30 mg but I do not really want to go much above this because of his renal function.  If after a week or 2 she is not noticing improvement then will consider adding a fentanyl patch."   He is more agitated during the daytime.  He is on amoxicillin for the wound for his foot and he is on Augmentin oral soln.  He has had persistent pain that has not been well controlled with just oral Tylenol.  He also has chronic low back pain for which he uses lidocaine patches and uses Aspercreme during the daytime.  His wife reports that he has intermittent hallucinations and becomes suddenly fearful of most like night terrors.  She feels the medication has helped his mood some but not his pain.    His daughter is here with him today as well.    Review of Systems  BP  139/62   Pulse 63   Temp 97.8 F (36.6 C)   Wt 115 lb (52.2 kg)   BMI 19.14 kg/m     Allergies  Allergen Reactions  . Gabapentin Swelling  . Linzess [Linaclotide] Nausea And Vomiting and Other (See Comments)    bloating  . Tramadol Other (See Comments)    dizzy    Past Medical History:  Diagnosis Date  . Dementia   . Diverticulosis   . GERD (gastroesophageal reflux disease)   . Hyperlipemia   . Hypertension   . Kidney failure   . Raynaud's disease   . Spasmatic colon     Past Surgical History:  Procedure Laterality Date  . AV FISTULA PLACEMENT    . CHOLECYSTECTOMY    . HERNIA REPAIR      Social History   Socioeconomic History  . Marital status: Married    Spouse name: Vira Agar  . Number of children: Not on file  . Years of education: Not on file  . Highest education level: Not on file  Social Needs  . Financial resource strain: Not on file  . Food insecurity - worry: Not on file  . Food insecurity - inability: Not on file  . Transportation needs - medical: Not  on file  . Transportation needs - non-medical: Not on file  Occupational History  . Occupation: Retired   Tobacco Use  . Smoking status: Former Research scientist (life sciences)  . Smokeless tobacco: Never Used  Substance and Sexual Activity  . Alcohol use: Yes    Alcohol/week: 1.2 oz    Types: 2 Glasses of wine per week  . Drug use: No  . Sexual activity: Not on file  Other Topics Concern  . Not on file  Social History Narrative  . Not on file    No family history on file.  Outpatient Encounter Medications as of 07/10/2017  Medication Sig  . acetaminophen (TYLENOL) 325 MG tablet Take 2 tablets (650 mg total) by mouth every 6 (six) hours as needed.  . AMBULATORY NON FORMULARY MEDICATION Medication Name: Youth worker wheelchair. Dx. Lower extremity weakness and recurrent falls.  Marland Kitchen amoxicillin-clavulanate (AUGMENTIN) 600-42.9 MG/5ML suspension   . Calcium-Phosphorus-Vitamin D (CITRACAL +D3 PO) Take by mouth.  .  Diclofenac Sodium (PENNSAID) 2 % SOLN APPLY TO AFFECTED AREA TWICE A DAY  . donepezil (ARICEPT) 23 MG TABS tablet Take 1 tablet (23 mg total) by mouth at bedtime.  . DULoxetine (CYMBALTA) 30 MG capsule Take 1 capsule (30 mg total) by mouth daily.  . famotidine (PEPCID) 40 MG tablet TAKE 1 TABLET BY MOUTH TWICE A DAY  . furosemide (LASIX) 20 MG tablet Take 20 mg by mouth as needed for edema.  Marland Kitchen ipratropium (ATROVENT) 0.03 % nasal spray Place 2 sprays into both nostrils every 12 (twelve) hours.  . memantine (NAMENDA) 10 MG tablet TAKE 1 TABLET (10 MG TOTAL) BY MOUTH 2 (TWO) TIMES DAILY.  Marland Kitchen Pediatric Multivitamins-Iron (CHILD CHEWABLE VITAMINS/IRON PO) Take by mouth.  . Probiotic Product (PROBIOTIC PO) Take by mouth.  . pyridOXINE (VITAMIN B-6) 100 MG tablet Take 100 mg by mouth daily.  Marland Kitchen oxyCODONE (ROXICODONE) 5 MG/5ML solution 5 ml po BID x 3 days, ok to increase to every 8 hours after that.  . [DISCONTINUED] omeprazole (PRILOSEC) 40 MG capsule Take 1 capsule (40 mg total) by mouth daily.   No facility-administered encounter medications on file as of 07/10/2017.          Objective:   Physical Exam  Constitutional:  Think frail man sitting in a wheelchair today with his gloves on .   HENT:  Head: Normocephalic and atraumatic.  Pulmonary/Chest: Effort normal.  Musculoskeletal: He exhibits no edema.  Skin: Skin is warm and dry.  Psychiatric: He has a normal mood and affect. His behavior is normal.          Assessment & Plan:  Pain management : We continued our discussion about controlling his pain.  He did get some benefit in regards to mood and not feeling as down and sad with the duloxetine but no real improvement in pain control.  There is still using a lidocaine patch at night and using Tylenol.  He is now having difficulty swallowing and so was performed to remove as many medications as possible to liquids.  We will go ahead and start him out with 5 mL's of oxycodone twice a day  after 3 days if he is tolerating this well without excess sedation then can increase to 3 times a day.  We will try this for 5 days and get a better idea of what dose he needs and then be able to write a 30-day supply at that point in time.  If we can get his pain better controlled that  I would like to try to wean off the duloxetine.  I do want to minimize how many medications he is currently taking.  Advanced dementia-at this point he does not recognize his wife most days and he is becoming more weak.  He is walking less and now having some swallowing problems particularly with pills.  He is able to eat without any significant difficulty.  I really think he is at the point that he would benefit from palliative care I think meeting with the family to really discuss their goals for treatment and prevention and how aggressive they want to be with his medications and management would be very helpful.   Time spent 40 minutes, greater than 50% time spent counseling about pain management and dementia and need for palliative care consultation.

## 2017-07-13 ENCOUNTER — Telehealth: Payer: Self-pay | Admitting: Family Medicine

## 2017-07-13 MED ORDER — ONDANSETRON 4 MG PO TBDP: 4.0000 mg | ORAL_TABLET | Freq: Four times a day (QID) | ORAL | 0 refills | Status: AC | PRN

## 2017-07-13 NOTE — Telephone Encounter (Signed)
Pt's wife advised. Zofran 4mg  ODT sent over, Pt is having a hard time swallowing pills. No further questions.

## 2017-07-13 NOTE — Telephone Encounter (Signed)
Pt was started on Oxycodone and Augmentin on Friday. He since started vomiting. Unsure which Rx is causing this. Will route.

## 2017-07-13 NOTE — Telephone Encounter (Signed)
I would stop the oxycodone first. Give it 24-48 hours and see if any improvement. We can also given him some zofran 4mg   for nausea. One tablet every 6 hours as needed for nausea #20 NRF.

## 2017-07-17 ENCOUNTER — Encounter (HOSPITAL_COMMUNITY)
Admission: RE | Admit: 2017-07-17 | Discharge: 2017-07-17 | Disposition: A | Discharge status: Home / Self Care | Payer: Medicare Other | Source: Ambulatory Visit | Attending: Nephrology | Admitting: Nephrology

## 2017-07-17 VITALS — BP 129/80 | HR 62 | Temp 97.7°F | Resp 18

## 2017-07-17 DIAGNOSIS — D631 Anemia in chronic kidney disease: Secondary | ICD-10-CM | POA: Insufficient documentation

## 2017-07-17 DIAGNOSIS — N184 Chronic kidney disease, stage 4 (severe): Secondary | ICD-10-CM

## 2017-07-17 MED ORDER — DARBEPOETIN ALFA 100 MCG/0.5ML IJ SOSY
PREFILLED_SYRINGE | INTRAMUSCULAR | Status: AC
  Filled 2017-07-17: qty 0.5

## 2017-07-17 MED ORDER — DARBEPOETIN ALFA 100 MCG/0.5ML IJ SOSY
100.0000 ug | PREFILLED_SYRINGE | INTRAMUSCULAR | Status: DC
  Administered 2017-07-17: 13:00:00 100 ug via SUBCUTANEOUS

## 2017-07-20 LAB — POCT HEMOGLOBIN-HEMACUE: HEMOGLOBIN: 11.5 g/dL — AB (ref 13.0–17.0)

## 2017-08-03 ENCOUNTER — Telehealth: Payer: Self-pay | Admitting: Family Medicine

## 2017-08-03 NOTE — Telephone Encounter (Signed)
Stanley Meyers, from [facility], called after hours line to get a verbal to start plan of care for hospice. Routing to PCP for approval.

## 2017-08-03 NOTE — Telephone Encounter (Signed)
OK for plan.  I think they called last week and I thought was approve then but maybe note.

## 2017-08-04 ENCOUNTER — Ambulatory Visit: Payer: Medicare Other | Admitting: Family Medicine

## 2017-08-04 ENCOUNTER — Telehealth: Payer: Self-pay | Admitting: Family Medicine

## 2017-08-04 NOTE — Telephone Encounter (Signed)
Verbal given. No further questions.

## 2017-08-04 NOTE — Telephone Encounter (Signed)
Please call patient's wife Vira Agar and let her know that she can open the Cymbalta capsules as long as he swallows it whole and does not chew the contents.  It can be added to a teaspoon of applesauce and swallowed.

## 2017-08-05 NOTE — Telephone Encounter (Signed)
Pt's wife advised. No further questions.  

## 2017-08-07 ENCOUNTER — Other Ambulatory Visit: Payer: Self-pay | Admitting: *Deleted

## 2017-08-07 MED ORDER — DULOXETINE HCL 30 MG PO CPEP: 30.0000 mg | ORAL_CAPSULE | Freq: Every day | ORAL | 1 refills | Status: AC

## 2017-08-14 ENCOUNTER — Encounter (HOSPITAL_COMMUNITY): Payer: Medicare Other

## 2017-08-18 ENCOUNTER — Ambulatory Visit: Payer: Medicare Other | Admitting: Family Medicine

## 2017-08-19 ENCOUNTER — Other Ambulatory Visit: Payer: Self-pay

## 2017-08-19 NOTE — Telephone Encounter (Signed)
[name] with Hospice called and states Stanley Meyers is transitioning to End of Life. The need order for end of life comfort medications.

## 2017-08-20 MED ORDER — HALOPERIDOL LACTATE 2 MG/ML PO CONC: 1.0000 mg | ORAL | 1 refills | Status: AC | PRN

## 2017-08-20 MED ORDER — ATROPINE SULFATE 1 % OP SOLN: 3.0000 [drp] | OPHTHALMIC | 1 refills | Status: AC | PRN

## 2017-08-20 MED ORDER — LORAZEPAM 2 MG/ML PO CONC: 1.0000 mg | ORAL | 1 refills | Status: AC | PRN

## 2017-08-25 ENCOUNTER — Telehealth: Payer: Self-pay | Admitting: Physician Assistant

## 2017-08-25 NOTE — Telephone Encounter (Signed)
FYI. Nurse Hotline called last night to report passing away last night.

## 2017-09-01 ENCOUNTER — Other Ambulatory Visit: Payer: Self-pay | Admitting: Sports Medicine

## 2017-09-14 DEATH — deceased
